# Patient Record
Sex: Female | Born: 1985 | Race: Black or African American | Hispanic: No | Marital: Married | Smoking: Never smoker
Health system: Southern US, Community
[De-identification: ages and names within clinical notes are randomized; demographics above are authoritative.]

## PROBLEM LIST (undated history)

## (undated) DIAGNOSIS — Z9221 Personal history of antineoplastic chemotherapy: Secondary | ICD-10-CM

## (undated) DIAGNOSIS — C50919 Malignant neoplasm of unspecified site of unspecified female breast: Secondary | ICD-10-CM

## (undated) DIAGNOSIS — Z853 Personal history of malignant neoplasm of breast: Secondary | ICD-10-CM

## (undated) HISTORY — PX: LEEP: SHX91

---

## 2014-12-01 DIAGNOSIS — C50919 Malignant neoplasm of unspecified site of unspecified female breast: Secondary | ICD-10-CM

## 2014-12-01 DIAGNOSIS — Z9221 Personal history of antineoplastic chemotherapy: Secondary | ICD-10-CM

## 2014-12-01 HISTORY — PX: BREAST BIOPSY: SHX20

## 2014-12-01 HISTORY — DX: Personal history of antineoplastic chemotherapy: Z92.21

## 2014-12-01 HISTORY — DX: Malignant neoplasm of unspecified site of unspecified female breast: C50.919

## 2015-08-16 ENCOUNTER — Other Ambulatory Visit: Payer: Self-pay | Admitting: Specialist

## 2015-08-16 DIAGNOSIS — N6001 Solitary cyst of right breast: Secondary | ICD-10-CM

## 2015-08-29 ENCOUNTER — Ambulatory Visit
Admission: RE | Admit: 2015-08-29 | Discharge: 2015-08-29 | Disposition: A | Discharge status: Home / Self Care | Payer: BLUE CROSS/BLUE SHIELD | Source: Ambulatory Visit | Attending: Specialist | Admitting: Specialist

## 2015-08-29 ENCOUNTER — Other Ambulatory Visit: Payer: Self-pay | Admitting: Specialist

## 2015-08-29 DIAGNOSIS — N6001 Solitary cyst of right breast: Secondary | ICD-10-CM

## 2015-08-30 ENCOUNTER — Ambulatory Visit
Admission: RE | Admit: 2015-08-30 | Discharge: 2015-08-30 | Disposition: A | Discharge status: Home / Self Care | Payer: BLUE CROSS/BLUE SHIELD | Source: Ambulatory Visit | Attending: Specialist | Admitting: Specialist

## 2015-08-30 ENCOUNTER — Other Ambulatory Visit: Payer: Self-pay | Admitting: Specialist

## 2015-08-30 ENCOUNTER — Ambulatory Visit
Admission: RE | Admit: 2015-08-30 | Discharge: 2015-08-30 | Disposition: A | Discharge status: Home / Self Care | Payer: BLUE CROSS/BLUE SHIELD | Source: Ambulatory Visit

## 2015-08-30 DIAGNOSIS — R921 Mammographic calcification found on diagnostic imaging of breast: Secondary | ICD-10-CM

## 2015-08-30 DIAGNOSIS — N6001 Solitary cyst of right breast: Secondary | ICD-10-CM

## 2015-09-01 DIAGNOSIS — Z853 Personal history of malignant neoplasm of breast: Secondary | ICD-10-CM

## 2015-09-01 HISTORY — DX: Personal history of malignant neoplasm of breast: Z85.3

## 2015-09-05 ENCOUNTER — Telehealth: Payer: Self-pay | Admitting: *Deleted

## 2015-09-05 DIAGNOSIS — Z170 Estrogen receptor positive status [ER+]: Secondary | ICD-10-CM

## 2015-09-05 DIAGNOSIS — C50411 Malignant neoplasm of upper-outer quadrant of right female breast: Secondary | ICD-10-CM | POA: Insufficient documentation

## 2015-09-05 NOTE — Telephone Encounter (Signed)
Pt returned my call and I confirmed 09/06/15 appt w/ her.  Unable to mail packet - gave verbal, directions, instruction and placed a note for an intake form to be given to the pt at time of check in.  Emailed Nilda Simmer and [place] at [place] to make them aware.  Emailed [place] and [place] for tracking.  Placed a copy of the records in Dr. Geralyn Flash box and took one to HIM to scan.

## 2015-09-05 NOTE — Assessment & Plan Note (Signed)
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 Neo-adjuvant chemotherapy with [place] Perjeta  2. Followed by surgery with most likely mastectomy 3. Followed by Antiestrogen therapy with tamoxifen  Chemotherapy counseling:I have discussed the risks and benefits of chemotherapy including the risks of nausea/ vomiting, risk of infection from low WBC count, fatigue due to chemo or anemia, bruising or bleeding due to low platelets, mouth sores, loss/ change in taste and decreased appetite. Liver and kidney function will be monitored through out chemotherapy as abnormalities in liver and kidney function may be a side effect of treatment. Cardiac dysfunction due to Adriamycin Herceptin and Perjeta were discussed in detail. Risk of permanent bone marrow dysfunction and leukemia due to chemo were also discussed.  Plan: 1. Breast MRI 2. Genetic counseling 3. Staging scans 4. Echocardiogram 5. Chemotherapy class 6. Start chemotherapy in 2 weeks

## 2015-09-06 ENCOUNTER — Encounter (HOSPITAL_BASED_OUTPATIENT_CLINIC_OR_DEPARTMENT_OTHER): Payer: Self-pay | Admitting: *Deleted

## 2015-09-06 ENCOUNTER — Encounter: Payer: Self-pay | Admitting: *Deleted

## 2015-09-06 ENCOUNTER — Encounter: Payer: Self-pay | Admitting: Hematology and Oncology

## 2015-09-06 ENCOUNTER — Ambulatory Visit (HOSPITAL_BASED_OUTPATIENT_CLINIC_OR_DEPARTMENT_OTHER): Payer: BLUE CROSS/BLUE SHIELD | Admitting: Hematology and Oncology

## 2015-09-06 ENCOUNTER — Telehealth: Payer: Self-pay | Admitting: Hematology and Oncology

## 2015-09-06 ENCOUNTER — Other Ambulatory Visit: Payer: Self-pay | Admitting: General Surgery

## 2015-09-06 VITALS — BP 111/65 | HR 55 | Temp 98.3°F | Resp 18 | Ht 63.0 in | Wt 168.7 lb

## 2015-09-06 DIAGNOSIS — C50511 Malignant neoplasm of lower-outer quadrant of right female breast: Secondary | ICD-10-CM | POA: Diagnosis not present

## 2015-09-06 DIAGNOSIS — Z170 Estrogen receptor positive status [ER+]: Secondary | ICD-10-CM

## 2015-09-06 DIAGNOSIS — C50411 Malignant neoplasm of upper-outer quadrant of right female breast: Secondary | ICD-10-CM

## 2015-09-06 NOTE — Telephone Encounter (Signed)
Appointments made and will call patient with 10/18 etc chemo as email to [facility] to help with this date  anne

## 2015-09-06 NOTE — Progress Notes (Signed)
Oncology Nurse Navigator Documentation  Oncology Nurse Navigator Flowsheets 09/06/2015  Navigator Encounter Type Initial MedOnc  Patient Visit Type Medonc  Barriers/Navigation Needs No barriers at this time  Interventions None required  Time Spent with Patient 15   Spoke with patient today at her initial visit with Dr. Lindi Adie.  Discussed navigation resources. Contact information given.  Encouraged her to call with any needs or concerns.

## 2015-09-06 NOTE — Progress Notes (Signed)
[place] CONSULT NOTE  Patient Care Team: [name] Patient as PCP - General (General Practice)  CHIEF COMPLAINTS/PURPOSE OF CONSULTATION:  Newly diagnosed breast cancer  HISTORY OF PRESENTING ILLNESS:  Lori Dodson 29 y.o. female is here because of recent diagnosis of right breast cancer. She felt a lump 2-3 weeks ago and brought to the attention of her primary care doctor who did examine her and send her for a mammogram. The mammogram revealed an ill-defined mass at 8:00 position with extensive pleomorphic malignant calcification extending from the mass. This extended up to 10.5 cm. She underwent biopsy on 09/05/2015. She had 2 biopsies done. Both the biopsies showed invasive ductal carcinoma with DCIS that was ER/PR positive HER-2 also positive. She was presented at the multidisciplinary tumor board. She is here today to discuss a treatment plan.  I reviewed her records extensively and collaborated the history with the patient.  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  She was never exposed to fertility medications or hormone replacement therapy.  She has no family history of Breast cancer Mother age 5 and endometrial cancer  maternal grandfather Stomach cancer Patient has 3 sisters  MEDICAL HISTORY:  No past medical  problems SURGICAL HISTORY: No prior surgeries SOCIAL HISTORY:denies any tobacco alcohol or depression drug use. She is a PRN nurse at [employer]  FAMILY HISTORY: mother endometrial cancer, grandfather maternal side stomach cancer ALLERGIES:  has No Known Allergies.  MEDICATIONS: no medications REVIEW OF SYSTEMS:   Constitutional: Denies fevers, chills or abnormal night sweats Eyes: Denies blurriness of vision, double vision or watery eyes Ears, nose, mouth, throat, and face: Denies mucositis or sore throat Respiratory: Denies cough, dyspnea or wheezes Cardiovascular: Denies palpitation, chest discomfort or lower extremity swelling Gastrointestinal:  Denies nausea, heartburn or change in bowel habits Skin: Denies abnormal skin rashes Lymphatics: Denies new lymphadenopathy or easy bruising Neurological:Denies numbness, tingling or new weaknesses Behavioral/Psych: Mood is stable, no new changes  Breast: palpable lump in the right breast  All other systems were reviewed with the patient and are negative.  PHYSICAL EXAMINATION: ECOG PERFORMANCE STATUS: 0 - Asymptomatic  Filed Vitals:   09/06/15 1252  BP: 111/65  Pulse: 55  Temp: 98.3 F (36.8 C)  Resp: 18   Filed Weights   09/06/15 1252  Weight: 168 lb 11.2 oz (76.522 kg)    GENERAL:alert, no distress and comfortable SKIN: skin color, texture, turgor are normal, no rashes or significant lesions EYES: normal, conjunctiva are pink and non-injected, sclera clear OROPHARYNX:no exudate, no erythema and lips, buccal mucosa, and tongue normal  NECK: supple, thyroid normal size, non-tender, without nodularity LYMPH:  no palpable lymphadenopathy in the cervical, axillary or inguinal LUNGS: clear to auscultation and percussion with normal breathing effort HEART: regular rate & rhythm and no murmurs and  no lower extremity edema ABDOMEN:abdomen soft, non-tender and normal bowel sounds Musculoskeletal:no  cyanosis of digits and no clubbing  PSYCH: alert & oriented x 3 with fluent speech NEURO: no focal motor/sensory deficits BREAST:right breast palpable lump at least 3-4 cm in size to finger palpation. 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 Neo-adjuvant chemotherapy with [place] Perjeta  2. Followed by surgery with most likely mastectomy 3. Followed by Antiestrogen therapy with tamoxifen  Chemotherapy counseling:I have discussed the risks and benefits of chemotherapy including the risks of nausea/ vomiting, risk of infection from low WBC count, fatigue due to chemo or anemia, bruising or bleeding due to low platelets, mouth sores, loss/ change in taste and decreased appetite. Liver and kidney function will be monitored through out chemotherapy as abnormalities in liver and kidney function may be a side effect of treatment. Cardiac dysfunction due to Adriamycin Herceptin and Perjeta were discussed in detail. Risk of permanent bone marrow dysfunction and leukemia due to chemo were also discussed.  Plan: 1. Breast MRI 2. Genetic counseling 3  Echocardiogram 4. Chemotherapy class 5. Start chemotherapy 09/18/15  All questions were answered. The patient knows to call the clinic  with any problems, questions or concerns.    Rulon Eisenmenger, MD 1:46 PM

## 2015-09-07 ENCOUNTER — Telehealth: Payer: Self-pay | Admitting: Hematology and Oncology

## 2015-09-07 ENCOUNTER — Encounter: Payer: Self-pay | Admitting: *Deleted

## 2015-09-07 ENCOUNTER — Telehealth: Payer: Self-pay | Admitting: *Deleted

## 2015-09-07 NOTE — Telephone Encounter (Signed)
Called patient and she is aware of her appointments and will get a new schedule 10/11

## 2015-09-07 NOTE — Telephone Encounter (Signed)
Per staff message and POF I have scheduled appts. Advised scheduler of appts. JMW  

## 2015-09-07 NOTE — Progress Notes (Unsigned)
Spoke with patient today concerning fertility issues.  She is interested in having more children.  I have made her an appointment at the fertility clinic at [facility] for Monday 09/10/15 at 8am.  She will follow back up with me after her appointment.  Informed her we may have to reschedule her chemo for later depending on what she decides to do.  Patient verbalized understanding.

## 2015-09-10 ENCOUNTER — Telehealth: Payer: Self-pay | Admitting: *Deleted

## 2015-09-10 NOTE — Telephone Encounter (Signed)
Received call from patient stating she cancelled her appointment at the fertility clinic.  She has discussed with her husband and they have decided not to pursue fertility treatments.

## 2015-09-10 NOTE — Progress Notes (Signed)
Note created by Dr. Gudena during office visit, copy to patient,original to scan. 

## 2015-09-11 ENCOUNTER — Other Ambulatory Visit: Payer: BLUE CROSS/BLUE SHIELD

## 2015-09-11 ENCOUNTER — Telehealth: Payer: Self-pay | Admitting: Hematology and Oncology

## 2015-09-11 ENCOUNTER — Ambulatory Visit
Admission: RE | Admit: 2015-09-11 | Discharge: 2015-09-11 | Disposition: A | Discharge status: Home / Self Care | Payer: BLUE CROSS/BLUE SHIELD | Source: Ambulatory Visit | Attending: Hematology and Oncology | Admitting: Hematology and Oncology

## 2015-09-11 ENCOUNTER — Encounter: Payer: Self-pay | Admitting: *Deleted

## 2015-09-11 ENCOUNTER — Encounter: Payer: Self-pay | Admitting: Hematology and Oncology

## 2015-09-11 ENCOUNTER — Other Ambulatory Visit: Payer: Self-pay

## 2015-09-11 DIAGNOSIS — C50411 Malignant neoplasm of upper-outer quadrant of right female breast: Secondary | ICD-10-CM

## 2015-09-11 MED ORDER — PROCHLORPERAZINE MALEATE 10 MG PO TABS: 10.0000 mg | ORAL_TABLET | Freq: Four times a day (QID) | ORAL | Status: DC | PRN

## 2015-09-11 MED ORDER — GADOBENATE DIMEGLUMINE 529 MG/ML IV SOLN
14.0000 mL | Freq: Once | INTRAVENOUS | Status: AC | PRN
  Administered 2015-09-11: 14 mL via INTRAVENOUS

## 2015-09-11 MED ORDER — DEXAMETHASONE 4 MG PO TABS: 4.0000 mg | ORAL_TABLET | Freq: Two times a day (BID) | ORAL | Status: DC

## 2015-09-11 MED ORDER — ONDANSETRON HCL 8 MG PO TABS: 8.0000 mg | ORAL_TABLET | Freq: Two times a day (BID) | ORAL | Status: DC

## 2015-09-11 MED ORDER — LIDOCAINE-PRILOCAINE 2.5-2.5 % EX CREA: TOPICAL_CREAM | CUTANEOUS | Status: DC

## 2015-09-11 MED ORDER — LORAZEPAM 0.5 MG PO TABS: 0.5000 mg | ORAL_TABLET | Freq: Every day | ORAL | Status: DC

## 2015-09-11 NOTE — Telephone Encounter (Signed)
Called dr bensimhon and left a message for an appointment and for echo appointments next year

## 2015-09-11 NOTE — Progress Notes (Signed)
Met with pt after Chemo Ed class. Introduced myself as Estate manager/land agent. Asked pt if she has met her Deductible/OOP for the year. Pt states probably not because she hasn't had anything done prior to this. Gave pt applications to [place] [place] in [place] to complete if interested.Advised pt I can send them off for her if needed but be sure to bring documentation requested to expedite. Also went over [employer] and requested income verification. Meet with pt for documentation and follow up on apps given on Thurs 09/13/15 at 10am prior to her echocardiogram. Pt verbalized understanding and was very appreciative.

## 2015-09-11 NOTE — Progress Notes (Signed)
Ativan prescription called in to [facility].

## 2015-09-11 NOTE — Telephone Encounter (Signed)
F/u for AM - Echo [facility]YOO#[id number]. Echo scheduled for 10/13 @ 11 am @ [facility]. Appointment to patient in [place] area.

## 2015-09-12 ENCOUNTER — Ambulatory Visit (HOSPITAL_COMMUNITY)
Admission: RE | Admit: 2015-09-12 | Discharge: 2015-09-12 | Disposition: A | Discharge status: Home / Self Care | Payer: BLUE CROSS/BLUE SHIELD | Source: Ambulatory Visit | Attending: Hematology and Oncology | Admitting: Hematology and Oncology

## 2015-09-12 DIAGNOSIS — C50411 Malignant neoplasm of upper-outer quadrant of right female breast: Secondary | ICD-10-CM | POA: Insufficient documentation

## 2015-09-12 DIAGNOSIS — I340 Nonrheumatic mitral (valve) insufficiency: Secondary | ICD-10-CM | POA: Insufficient documentation

## 2015-09-12 NOTE — Progress Notes (Signed)
  Echocardiogram 2D Echocardiogram has been performed.  Lori Dodson 09/12/2015, 2:47 PM

## 2015-09-13 ENCOUNTER — Encounter (HOSPITAL_BASED_OUTPATIENT_CLINIC_OR_DEPARTMENT_OTHER): Payer: Self-pay

## 2015-09-13 ENCOUNTER — Ambulatory Visit: Payer: BLUE CROSS/BLUE SHIELD

## 2015-09-13 ENCOUNTER — Other Ambulatory Visit: Payer: Self-pay | Admitting: Hematology and Oncology

## 2015-09-13 ENCOUNTER — Ambulatory Visit (HOSPITAL_COMMUNITY): Payer: BLUE CROSS/BLUE SHIELD

## 2015-09-13 ENCOUNTER — Other Ambulatory Visit: Payer: Self-pay | Admitting: *Deleted

## 2015-09-13 ENCOUNTER — Ambulatory Visit (HOSPITAL_BASED_OUTPATIENT_CLINIC_OR_DEPARTMENT_OTHER): Payer: BLUE CROSS/BLUE SHIELD | Admitting: Anesthesiology

## 2015-09-13 ENCOUNTER — Ambulatory Visit (HOSPITAL_BASED_OUTPATIENT_CLINIC_OR_DEPARTMENT_OTHER)
Admission: RE | Admit: 2015-09-13 | Discharge: 2015-09-13 | Disposition: A | Discharge status: Home / Self Care | Payer: BLUE CROSS/BLUE SHIELD | Source: Ambulatory Visit | Attending: General Surgery | Admitting: General Surgery

## 2015-09-13 ENCOUNTER — Encounter: Payer: Self-pay | Admitting: Hematology and Oncology

## 2015-09-13 ENCOUNTER — Encounter (HOSPITAL_BASED_OUTPATIENT_CLINIC_OR_DEPARTMENT_OTHER)
Admission: RE | Disposition: A | Discharge status: Home / Self Care | Payer: Self-pay | Source: Ambulatory Visit | Attending: General Surgery

## 2015-09-13 DIAGNOSIS — Z95828 Presence of other vascular implants and grafts: Secondary | ICD-10-CM

## 2015-09-13 DIAGNOSIS — C50411 Malignant neoplasm of upper-outer quadrant of right female breast: Secondary | ICD-10-CM

## 2015-09-13 DIAGNOSIS — C50511 Malignant neoplasm of lower-outer quadrant of right female breast: Secondary | ICD-10-CM | POA: Insufficient documentation

## 2015-09-13 HISTORY — PX: PORTACATH PLACEMENT: SHX2246

## 2015-09-13 HISTORY — DX: Malignant neoplasm of unspecified site of unspecified female breast: C50.919

## 2015-09-13 LAB — POCT HEMOGLOBIN-HEMACUE: HEMOGLOBIN: 13.6 g/dL (ref 12.0–15.0)

## 2015-09-13 SURGERY — INSERTION, TUNNELED CENTRAL VENOUS DEVICE, WITH PORT
Anesthesia: General | Site: Chest | Laterality: Left

## 2015-09-13 MED ORDER — HYDROCODONE-ACETAMINOPHEN 5-325 MG PO TABS: 1.0000 | ORAL_TABLET | Freq: Four times a day (QID) | ORAL | Status: DC | PRN

## 2015-09-13 MED ORDER — GLYCOPYRROLATE 0.2 MG/ML IJ SOLN: 0.2000 mg | Freq: Once | INTRAMUSCULAR | Status: DC | PRN

## 2015-09-13 MED ORDER — LACTATED RINGERS IV SOLN
INTRAVENOUS | Status: DC
  Administered 2015-09-13 (×2): via INTRAVENOUS

## 2015-09-13 MED ORDER — MIDAZOLAM HCL 2 MG/2ML IJ SOLN
INTRAMUSCULAR | Status: AC
  Filled 2015-09-13: qty 4

## 2015-09-13 MED ORDER — OXYCODONE HCL 5 MG PO TABS
5.0000 mg | ORAL_TABLET | Freq: Once | ORAL | Status: DC | PRN
  Administered 2015-09-13: 5 mg via ORAL

## 2015-09-13 MED ORDER — FENTANYL CITRATE (PF) 100 MCG/2ML IJ SOLN
50.0000 ug | INTRAMUSCULAR | Status: DC | PRN
  Administered 2015-09-13: 100 ug via INTRAVENOUS

## 2015-09-13 MED ORDER — MIDAZOLAM HCL 2 MG/2ML IJ SOLN
1.0000 mg | INTRAMUSCULAR | Status: DC | PRN
  Administered 2015-09-13: 2 mg via INTRAVENOUS

## 2015-09-13 MED ORDER — OXYCODONE HCL 5 MG PO TABS
ORAL_TABLET | ORAL | Status: AC
  Filled 2015-09-13: qty 1

## 2015-09-13 MED ORDER — CEFAZOLIN SODIUM-DEXTROSE 2-3 GM-% IV SOLR
2.0000 g | INTRAVENOUS | Status: AC
  Administered 2015-09-13: 2 g via INTRAVENOUS

## 2015-09-13 MED ORDER — DEXAMETHASONE SODIUM PHOSPHATE 10 MG/ML IJ SOLN
INTRAMUSCULAR | Status: AC
  Filled 2015-09-13: qty 1

## 2015-09-13 MED ORDER — SODIUM CHLORIDE 0.9 % IV SOLN
INTRAVENOUS | Status: DC | PRN
  Administered 2015-09-13: 10 mL

## 2015-09-13 MED ORDER — FENTANYL CITRATE (PF) 100 MCG/2ML IJ SOLN
INTRAMUSCULAR | Status: AC
  Filled 2015-09-13: qty 4

## 2015-09-13 MED ORDER — BUPIVACAINE HCL (PF) 0.25 % IJ SOLN
INTRAMUSCULAR | Status: DC | PRN
  Administered 2015-09-13: 9 mL

## 2015-09-13 MED ORDER — HYDROMORPHONE HCL 1 MG/ML IJ SOLN
INTRAMUSCULAR | Status: AC
  Filled 2015-09-13: qty 1

## 2015-09-13 MED ORDER — PROMETHAZINE HCL 25 MG/ML IJ SOLN: 6.2500 mg | INTRAMUSCULAR | Status: DC | PRN

## 2015-09-13 MED ORDER — MEPERIDINE HCL 25 MG/ML IJ SOLN: 6.2500 mg | INTRAMUSCULAR | Status: DC | PRN

## 2015-09-13 MED ORDER — SCOPOLAMINE 1 MG/3DAYS TD PT72: 1.0000 | MEDICATED_PATCH | Freq: Once | TRANSDERMAL | Status: DC | PRN

## 2015-09-13 MED ORDER — ONDANSETRON HCL 4 MG/2ML IJ SOLN
INTRAMUSCULAR | Status: AC
  Filled 2015-09-13: qty 2

## 2015-09-13 MED ORDER — LACTATED RINGERS IV SOLN: INTRAVENOUS | Status: DC

## 2015-09-13 MED ORDER — PROPOFOL 10 MG/ML IV BOLUS
INTRAVENOUS | Status: DC | PRN
  Administered 2015-09-13: 200 mg via INTRAVENOUS

## 2015-09-13 MED ORDER — LIDOCAINE HCL (CARDIAC) 20 MG/ML IV SOLN
INTRAVENOUS | Status: DC | PRN
  Administered 2015-09-13: 50 mg via INTRAVENOUS

## 2015-09-13 MED ORDER — CHLORHEXIDINE GLUCONATE 4 % EX LIQD: 1.0000 "application " | Freq: Once | CUTANEOUS | Status: DC

## 2015-09-13 MED ORDER — CEFAZOLIN SODIUM-DEXTROSE 2-3 GM-% IV SOLR
INTRAVENOUS | Status: AC
  Filled 2015-09-13: qty 50

## 2015-09-13 MED ORDER — DEXAMETHASONE SODIUM PHOSPHATE 4 MG/ML IJ SOLN
INTRAMUSCULAR | Status: DC | PRN
  Administered 2015-09-13: 10 mg via INTRAVENOUS

## 2015-09-13 MED ORDER — HYDROMORPHONE HCL 1 MG/ML IJ SOLN
0.2500 mg | INTRAMUSCULAR | Status: DC | PRN
  Administered 2015-09-13 (×4): 0.5 mg via INTRAVENOUS

## 2015-09-13 SURGICAL SUPPLY — 44 items
BAG DECANTER FOR FLEXI CONT (MISCELLANEOUS) ×3 IMPLANT
BLADE SURG 15 STRL LF DISP TIS (BLADE) ×1 IMPLANT
BLADE SURG 15 STRL SS (BLADE) ×2
CANISTER SUCT 1200ML W/VALVE (MISCELLANEOUS) IMPLANT
CHLORAPREP W/TINT 26ML (MISCELLANEOUS) ×3 IMPLANT
CLEANER CAUTERY TIP 5X5 PAD (MISCELLANEOUS) ×1 IMPLANT
COVER BACK TABLE 60X90IN (DRAPES) ×3 IMPLANT
COVER MAYO STAND STRL (DRAPES) ×3 IMPLANT
COVER PROBE 5X48 (MISCELLANEOUS) ×2
DECANTER SPIKE VIAL GLASS SM (MISCELLANEOUS) IMPLANT
DRAPE C-ARM 42X72 X-RAY (DRAPES) ×3 IMPLANT
DRAPE LAPAROSCOPIC ABDOMINAL (DRAPES) ×3 IMPLANT
DRAPE UTILITY XL STRL (DRAPES) ×3 IMPLANT
ELECT REM PT RETURN 9FT ADLT (ELECTROSURGICAL) ×3
ELECTRODE REM PT RTRN 9FT ADLT (ELECTROSURGICAL) ×1 IMPLANT
GLOVE BIO SURGEON STRL SZ7.5 (GLOVE) ×3 IMPLANT
GOWN STRL REUS W/ TWL LRG LVL3 (GOWN DISPOSABLE) ×2 IMPLANT
GOWN STRL REUS W/TWL LRG LVL3 (GOWN DISPOSABLE) ×4
IV KIT MINILOC 20X1 SAFETY (NEEDLE) IMPLANT
KIT CVR 48X5XPRB PLUP LF (MISCELLANEOUS) ×1 IMPLANT
LIQUID BAND (GAUZE/BANDAGES/DRESSINGS) ×3 IMPLANT
NDL SAFETY ECLIPSE 18X1.5 (NEEDLE) IMPLANT
NEEDLE HYPO 18GX1.5 SHARP (NEEDLE)
NEEDLE HYPO 22GX1.5 SAFETY (NEEDLE) IMPLANT
NEEDLE HYPO 25X1 1.5 SAFETY (NEEDLE) ×3 IMPLANT
NEEDLE SPNL 22GX3.5 QUINCKE BK (NEEDLE) IMPLANT
PACK BASIN DAY SURGERY FS (CUSTOM PROCEDURE TRAY) ×3 IMPLANT
PAD CLEANER CAUTERY TIP 5X5 (MISCELLANEOUS) ×2
PENCIL BUTTON HOLSTER BLD 10FT (ELECTRODE) ×3 IMPLANT
SLEEVE SCD COMPRESS KNEE MED (MISCELLANEOUS) IMPLANT
SUT MON AB 4-0 PC3 18 (SUTURE) ×3 IMPLANT
SUT PROLENE 2 0 SH DA (SUTURE) ×3 IMPLANT
SUT SILK 2 0 TIES 17X18 (SUTURE)
SUT SILK 2-0 18XBRD TIE BLK (SUTURE) IMPLANT
SUT VIC AB 3-0 SH 27 (SUTURE) ×2
SUT VIC AB 3-0 SH 27X BRD (SUTURE) ×1 IMPLANT
SYR 5ML LL (SYRINGE) ×3 IMPLANT
SYR CONTROL 10ML LL (SYRINGE) ×6 IMPLANT
SYRINGE 10CC LL (SYRINGE) IMPLANT
TOWEL OR 17X24 6PK STRL BLUE (TOWEL DISPOSABLE) ×6 IMPLANT
TOWEL OR NON WOVEN STRL DISP B (DISPOSABLE) ×3 IMPLANT
TUBE CONNECTING 20'X1/4 (TUBING)
TUBE CONNECTING 20X1/4 (TUBING) IMPLANT
YANKAUER SUCT BULB TIP NO VENT (SUCTIONS) IMPLANT

## 2015-09-13 NOTE — Discharge Instructions (Signed)

## 2015-09-13 NOTE — Anesthesia Preprocedure Evaluation (Signed)
Anesthesia Evaluation  Patient identified by MRN, date of birth, ID band Patient awake    Reviewed: Allergy & Precautions, NPO status , Patient's Chart, lab work & pertinent test results  Airway Mallampati: II  TM Distance: >3 FB Neck ROM: Full    Dental  (+) Teeth Intact   Pulmonary neg pulmonary ROS,    breath sounds clear to auscultation       Cardiovascular negative cardio ROS   Rhythm:Regular Rate:Normal     Neuro/Psych negative neurological ROS  negative psych ROS   GI/Hepatic negative GI ROS, Neg liver ROS,   Endo/Other  negative endocrine ROS  Renal/GU negative Renal ROS  negative genitourinary   Musculoskeletal negative musculoskeletal ROS (+)   Abdominal   Peds negative pediatric ROS (+)  Hematology negative hematology ROS (+)   Anesthesia Other Findings   Reproductive/Obstetrics negative OB ROS                             Anesthesia Physical Anesthesia Plan  ASA: II  Anesthesia Plan: General   Post-op Pain Management:    Induction: Intravenous  Airway Management Planned: LMA  Additional Equipment:   Intra-op Plan:   Post-operative Plan: Extubation in OR  Informed Consent: I have reviewed the patients History and Physical, chart, labs and discussed the procedure including the risks, benefits and alternatives for the proposed anesthesia with the patient or authorized representative who has indicated his/her understanding and acceptance.   Dental advisory given  Plan Discussed with: CRNA  Anesthesia Plan Comments:         Anesthesia Quick Evaluation

## 2015-09-13 NOTE — Anesthesia Procedure Notes (Signed)
Procedure Name: LMA Insertion Date/Time: 09/13/2015 2:10 PM Performed by: Lieutenant Diego Pre-anesthesia Checklist: Patient identified, Emergency Drugs available, Suction available and Patient being monitored Patient Re-evaluated:Patient Re-evaluated prior to inductionOxygen Delivery Method: Circle System Utilized Preoxygenation: Pre-oxygenation with 100% oxygen Intubation Type: IV induction Ventilation: Mask ventilation without difficulty LMA: LMA inserted LMA Size: 4.0 Number of attempts: 1 Airway Equipment and Method: Bite block Placement Confirmation: positive ETCO2 and breath sounds checked- equal and bilateral Tube secured with: Tape Dental Injury: Teeth and Oropharynx as per pre-operative assessment

## 2015-09-13 NOTE — Interval H&P Note (Signed)
History and Physical Interval Note:  09/13/2015 1:54 PM  Providence Stivers  has presented today for surgery, with the diagnosis of RIGHT BREAST CANCER  The various methods of treatment have been discussed with the patient and family. After consideration of risks, benefits and other options for treatment, the patient has consented to  Procedure(s): INSERTION PORT-A-CATH (N/A) as a surgical intervention .  The patient's history has been reviewed, patient examined, no change in status, stable for surgery.  I have reviewed the patient's chart and labs.  Questions were answered to the patient's satisfaction.     TOTH III,PAUL S

## 2015-09-13 NOTE — Transfer of Care (Signed)
Immediate Anesthesia Transfer of Care Note  Patient: Lori Dodson  Procedure(s) Performed: Procedure(s): INSERTION PORT-A-CATH (N/A)  Patient Location: PACU  Anesthesia Type:General  Level of Consciousness: awake  Airway & Oxygen Therapy: Patient Spontanous Breathing and Patient connected to face mask oxygen  Post-op Assessment: Report given to RN and Post -op Vital signs reviewed and stable  Post vital signs: Reviewed and stable  Last Vitals:  Filed Vitals:   09/13/15 1209  BP: 106/63  Pulse: 72  Temp: 37 C  Resp: 16    Complications: No apparent anesthesia complications

## 2015-09-13 NOTE — Op Note (Signed)
09/13/2015  2:52 PM  PATIENT:  Lori Dodson  29 y.o. female  PRE-OPERATIVE DIAGNOSIS:  RIGHT BREAST CANCER  POST-OPERATIVE DIAGNOSIS:  RIGHT BREAST CANCER  PROCEDURE:  Procedure(s): INSERTION PORT-A-CATH (N/A) Unsuccessful  SURGEON:  Surgeon(s) and Role:    * Jovita Kussmaul, MD - Primary  PHYSICIAN ASSISTANT:   ASSISTANTS: none   ANESTHESIA:   general  EBL:  Total I/O In: 1000 [I.V.:1000] Out: -   BLOOD ADMINISTERED:none  DRAINS: none   LOCAL MEDICATIONS USED:  MARCAINE     SPECIMEN:  No Specimen  DISPOSITION OF SPECIMEN:  N/A  COUNTS:  YES  TOURNIQUET:  * No tourniquets in log *  DICTATION: .Dragon Dictation   After informed consent was obtained the patient was brought to the operating room and placed in the supine position on the operating table. After adequate induction of general anesthesia a roll was placed between the patient's shoulder blades to extend the shoulder slightly. The Patient's chest and neck area were then prepped with ChloraPrep, allowed to dry, and draped in usual sterile manner. The patient was then placed in Trendelenburg position. The area lateral to the bend of the clavicle on the left chest was infiltrated with quarter percent Marcaine. A small stab incision was made with a 15 blade knife. A large bore needle from the Port-A-Cath kit was used to slide beneath the bend of the clavicle heading towards the sternal notch. Every time we attempted this we were unable to find a space between her ribs and clavicle. We tried retracting on the arm caudally and this did not open up any space for [place]. I then moved to the neck to access the left IJ. I was able to access the left IJ without difficulty using the large bore needle from the Port-A-Cath kit. Every time I attempted to feed the wire through the needle the wire would stop. I then used an intraoperative ultrasound to identify the left IJ and access it with the large bore needle. Again while feeding the wire  through the needle the wire would reach a point and then stop. In order to avoid any unnecessary injury at this point I aborted the placement of the Port-A-Cath. The small incision on the left chest was closed with Dermabond. She tolerated the procedure well. At the end of the case all needle sponge and instrument counts were correct. The patient was then awakened and taken to recovery in stable condition. I will arrange for interventional radiology to place her Port-A-Cath.  PLAN OF CARE: Discharge to home after PACU  PATIENT DISPOSITION:  PACU - hemodynamically stable.   Delay start of Pharmacological VTE agent (>24hrs) due to surgical blood loss or risk of bleeding: not applicable

## 2015-09-13 NOTE — H&P (Signed)
Lori Dodson 09/03/2015 3:51 PM Location: [facility] Patient #: [id number] DOB: [birth year] Married / Language: English / Race: Black or African American Female  History of Present Illness Sammuel Hines. Marlou Starks MD; 09/03/2015 4:42 PM) Patient words: breast ca.  The patient is a 29 year old female who presents with breast cancer. We are asked to see the patient in consultation by Dr. Luberta Robertson to evaluate her for a right breast cancer. The patient is a 29 year old black female who recently felt a mass in the lateral aspect of the right breast. she denies any breast pain or discharge from the nipple. A mammogram was obtained that showed a 10-1/2 cm area of calcification in the lateral right breast. 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 She has no personal or family history of breast cancer. She does not take any female hormones. Her tumor markers have not been reported yet.   Other Problems Marjean Donna, CMA; 09/03/2015 3:51 PM) Breast Cancer Lump In Breast  Past Surgical History Marjean Donna, [place]; 09/03/2015 3:51 PM) No pertinent past surgical history  Diagnostic Studies History Marjean Donna, CMA; 09/03/2015 3:51 PM) Colonoscopy never Mammogram within last year Pap Smear 1-5 years ago  Allergies Marjean Donna, CMA; 09/03/2015 3:52 PM) No Known Drug Allergies10/01/2015  Medication History Marjean Donna, CMA; 09/03/2015 3:52 PM) No Current Medications Medications Reconciled  Social History ([place]; 09/03/2015 3:51 PM) Alcohol use Occasional alcohol use. Caffeine use Tea. No drug use Tobacco use Never smoker.  Family History Marjean Donna, [place]; 09/03/2015 3:51 PM) Diabetes Mellitus Mother.  Pregnancy / Birth History Marjean Donna, [place]; 09/03/2015 3:51 PM) Age at menarche 28 years. Contraceptive History Oral contraceptives. Gravida 5 Maternal age 40-20 Para 2 Regular periods  Review of Systems  ([place]; 09/03/2015 3:51 PM) General Not Present- Appetite Loss, Chills, Fatigue, Fever, Night Sweats, Weight Gain and Weight Loss. Skin Not Present- Change in Wart/Mole, Dryness, Hives, Jaundice, New Lesions, Non-Healing Wounds, Rash and Ulcer. HEENT Present- Wears glasses/contact lenses. Not Present- Earache, Hearing Loss, Hoarseness, Nose Bleed, Oral Ulcers, Ringing in the Ears, Seasonal Allergies, Sinus Pain, Sore Throat, Visual Disturbances and Yellow Eyes. Respiratory Not Present- Bloody sputum, Chronic Cough, Difficulty Breathing, Snoring and Wheezing. Breast Present- Breast Mass and Breast Pain. Not Present- Nipple Discharge and Skin Changes. Cardiovascular Not Present- Chest Pain, Difficulty Breathing Lying Down, Leg Cramps, Palpitations, Rapid Heart Rate, Shortness of Breath and Swelling of Extremities. Gastrointestinal Present- Bloating and Constipation. Not Present- Abdominal Pain, Bloody Stool, Change in Bowel Habits, Chronic diarrhea, Difficulty Swallowing, Excessive gas, Gets full quickly at meals, Hemorrhoids, Indigestion, Nausea, Rectal Pain and Vomiting. Female Genitourinary Not Present- Frequency, Nocturia, Painful Urination, Pelvic Pain and Urgency. Musculoskeletal Not Present- Back Pain, Joint Pain, Joint Stiffness, Muscle Pain, Muscle Weakness and Swelling of Extremities. Neurological Not Present- Decreased Memory, Fainting, Headaches, Numbness, Seizures, Tingling, Tremor, Trouble walking and Weakness. Psychiatric Not Present- Anxiety, Bipolar, Change in Sleep Pattern, Depression, Fearful and Frequent crying. Endocrine Not Present- Cold Intolerance, Excessive Hunger, Hair Changes, Heat Intolerance, Hot flashes and New Diabetes. Hematology Not Present- Easy Bruising, Excessive bleeding, Gland problems, HIV and Persistent Infections.   Vitals (Sonya Bynum CMA; 09/03/2015 3:52 PM) 09/03/2015 3:51 PM Weight: 168 lb Height: 63in Body Surface Area: 1.84 m Body Mass  Index: 29.76 kg/m Temp.: 97.28F(Temporal)  Pulse: 65 (Regular)  BP: 128/78 (Sitting, Left Arm, Standard)    Physical Exam Eddie Dibbles S. Marlou Starks MD; 09/03/2015 4:42 PM) General  Mental Status-Alert. General Appearance-Consistent with stated age. Hydration-Well hydrated. Voice-Normal.  Head and Neck Head-normocephalic, atraumatic with no lesions or palpable masses. Trachea-midline. Thyroid Gland Characteristics - normal size and consistency.  Eye Eyeball - Bilateral-Extraocular movements intact. Sclera/Conjunctiva - Bilateral-No scleral icterus.  Chest and Lung Exam Chest and lung exam reveals -quiet, even and easy respiratory effort with no use of accessory muscles and on auscultation, normal breath sounds, no adventitious sounds and normal vocal resonance. Inspection Chest Wall - Normal. Back - normal.  Breast Note: There is a palpable 2 cm mass in the lateral right breast at the edge of the areolar. There is no palpable mass in the left breast. There is no palpable axillary, supraclavicular, or cervical lymphadenopathy.   Cardiovascular Cardiovascular examination reveals -normal heart sounds, regular rate and rhythm with no murmurs and normal pedal pulses bilaterally.  Abdomen Inspection Inspection of the abdomen reveals - No Hernias. Skin - Scar - no surgical scars. Palpation/Percussion Palpation and Percussion of the abdomen reveal - Soft, Non Tender, No Rebound tenderness, No Rigidity (guarding) and No hepatosplenomegaly. Auscultation Auscultation of the abdomen reveals - Bowel sounds normal.  Neurologic Neurologic evaluation reveals -alert and oriented x 3 with no impairment of recent or remote memory. Mental Status-Normal.  Musculoskeletal Normal Exam - Left-Upper Extremity Strength Normal and Lower Extremity Strength Normal. Normal Exam - Right-Upper Extremity Strength Normal and Lower Extremity Strength Normal.  Lymphatic Head &  Neck  General Head & Neck Lymphatics: Bilateral - Description - Normal. Axillary  General Axillary Region: Bilateral - Description - Normal. Tenderness - Non Tender. Femoral & Inguinal  Generalized Femoral & Inguinal Lymphatics: Bilateral - Description - Normal. Tenderness - Non Tender.    Assessment & Plan Eddie Dibbles S. Marlou Starks MD; 09/03/2015 4:40 PM) PRIMARY CANCER OF LOWER OUTER QUADRANT OF RIGHT FEMALE BREAST (C50.511) Impression: The patient appears to have a 10 cm cancer in the outer aspect of the right breast. Given her age and the size of the cancer I think she would benefit from meeting with medical oncology to consider neoadjuvant therapy. She may also require a Port-A-Cath. I have discussed with her in detail the risks and benefits of the operation to place a Port-A-Cath as well as some of the technical aspects and she understands. Also given the size of the area involved she would require mastectomy as her definitive surgery. I think it is unlikely she would be a good candidate for immediate reconstruction given the probability of needing postmastectomy radiation. I will refer her to medical oncology. If she has a Port-A-Cath placed and I will plan to see her back in the next month to check her progress. Current Plans  Referred to Oncology, for evaluation and follow up (Oncology). Follow up in 1 month or as needed Pt Education - Breast Cancer: discussed with patient and provided information.   Signed by Luella Cook, MD (09/03/2015 4:43 PM)

## 2015-09-13 NOTE — Progress Notes (Signed)
Met with pt to go over financial assistance applications and [employer]. Approved pt for $1,000 [employer]. Pt signed application and gave copy. Pt returned [employer] application and [employer]. Will send them off for her. Pt will bring other documentation and [employer] application on Monday.

## 2015-09-13 NOTE — Anesthesia Postprocedure Evaluation (Signed)
  Anesthesia Post-op Note  Patient: Lori Dodson  Procedure(s) Performed: Procedure(s): ATTEMPTED INSERTION PORT-A-CATH (Left)  Patient Location: PACU  Anesthesia Type:General  Level of Consciousness: awake, alert  and oriented  Airway and Oxygen Therapy: Patient Spontanous Breathing  Post-op Pain: mild, moderate  Post-op Assessment: Post-op Vital signs reviewed and Patient's Cardiovascular Status Stable              Post-op Vital Signs: Reviewed and stable  Last Vitals:  Filed Vitals:   09/13/15 1545  BP: 117/80  Pulse: 68  Temp:   Resp: 18    Complications: No apparent anesthesia complications

## 2015-09-14 ENCOUNTER — Encounter (HOSPITAL_BASED_OUTPATIENT_CLINIC_OR_DEPARTMENT_OTHER): Payer: Self-pay | Admitting: General Surgery

## 2015-09-14 ENCOUNTER — Other Ambulatory Visit: Payer: Self-pay | Admitting: Radiology

## 2015-09-14 ENCOUNTER — Telehealth: Payer: Self-pay

## 2015-09-14 ENCOUNTER — Other Ambulatory Visit: Payer: Self-pay | Admitting: *Deleted

## 2015-09-14 DIAGNOSIS — C50411 Malignant neoplasm of upper-outer quadrant of right female breast: Secondary | ICD-10-CM

## 2015-09-14 NOTE — Telephone Encounter (Signed)
Lori Dodson was calling to see the status of her husband's FMLA papers.  They brought them in ~09-06-15 to be filled out.

## 2015-09-17 ENCOUNTER — Ambulatory Visit (HOSPITAL_COMMUNITY)
Admission: RE | Admit: 2015-09-17 | Discharge: 2015-09-17 | Disposition: A | Discharge status: Home / Self Care | Payer: BLUE CROSS/BLUE SHIELD | Source: Ambulatory Visit | Attending: Hematology and Oncology | Admitting: Hematology and Oncology

## 2015-09-17 ENCOUNTER — Encounter: Payer: Self-pay | Admitting: Hematology and Oncology

## 2015-09-17 ENCOUNTER — Ambulatory Visit
Admission: RE | Admit: 2015-09-17 | Discharge: 2015-09-17 | Disposition: A | Discharge status: Home / Self Care | Payer: BLUE CROSS/BLUE SHIELD | Source: Ambulatory Visit | Attending: Hematology and Oncology | Admitting: Hematology and Oncology

## 2015-09-17 ENCOUNTER — Encounter (HOSPITAL_COMMUNITY): Payer: Self-pay

## 2015-09-17 ENCOUNTER — Other Ambulatory Visit: Payer: Self-pay | Admitting: Hematology and Oncology

## 2015-09-17 DIAGNOSIS — C50411 Malignant neoplasm of upper-outer quadrant of right female breast: Secondary | ICD-10-CM

## 2015-09-17 DIAGNOSIS — C50911 Malignant neoplasm of unspecified site of right female breast: Secondary | ICD-10-CM | POA: Insufficient documentation

## 2015-09-17 HISTORY — PX: PORTA CATH INSERTION: CATH118285

## 2015-09-17 LAB — CBC
HEMATOCRIT: 37.4 % (ref 36.0–46.0)
Hemoglobin: 12.8 g/dL (ref 12.0–15.0)
MCH: 28.9 pg (ref 26.0–34.0)
MCHC: 34.2 g/dL (ref 30.0–36.0)
MCV: 84.4 fL (ref 78.0–100.0)
PLATELETS: 283 10*3/uL (ref 150–400)
RBC: 4.43 MIL/uL (ref 3.87–5.11)
RDW: 13 % (ref 11.5–15.5)
WBC: 8.6 10*3/uL (ref 4.0–10.5)

## 2015-09-17 LAB — BASIC METABOLIC PANEL
Anion gap: 7 (ref 5–15)
BUN: 9 mg/dL (ref 6–20)
CHLORIDE: 102 mmol/L (ref 101–111)
CO2: 27 mmol/L (ref 22–32)
CREATININE: 0.79 mg/dL (ref 0.44–1.00)
Calcium: 8.8 mg/dL — ABNORMAL LOW (ref 8.9–10.3)
GFR calc Af Amer: >60 mL/min (ref >60)
GFR calc non Af Amer: >60 mL/min (ref >60)
GLUCOSE: 116 mg/dL — AB (ref 65–99)
POTASSIUM: 3.7 mmol/L (ref 3.5–5.1)
SODIUM: 136 mmol/L (ref 135–145)

## 2015-09-17 LAB — PROTIME-INR
INR: 1.08 (ref 0.00–1.49)
Prothrombin Time: 14.2 seconds (ref 11.6–15.2)

## 2015-09-17 LAB — APTT: aPTT: 29 seconds (ref 24–37)

## 2015-09-17 MED ORDER — SODIUM CHLORIDE 0.9 % IV SOLN: Freq: Once | INTRAVENOUS | Status: DC

## 2015-09-17 MED ORDER — FENTANYL CITRATE (PF) 100 MCG/2ML IJ SOLN
INTRAMUSCULAR | Status: AC | PRN
  Administered 2015-09-17 (×2): 25 ug via INTRAVENOUS
  Administered 2015-09-17: 50 ug via INTRAVENOUS
  Administered 2015-09-17: 25 ug via INTRAVENOUS

## 2015-09-17 MED ORDER — MIDAZOLAM HCL 2 MG/2ML IJ SOLN
INTRAMUSCULAR | Status: AC
  Filled 2015-09-17: qty 4

## 2015-09-17 MED ORDER — FENTANYL CITRATE (PF) 100 MCG/2ML IJ SOLN
INTRAMUSCULAR | Status: AC
  Filled 2015-09-17: qty 4

## 2015-09-17 MED ORDER — MIDAZOLAM HCL 2 MG/2ML IJ SOLN
INTRAMUSCULAR | Status: AC | PRN
  Administered 2015-09-17: 1 mg via INTRAVENOUS
  Administered 2015-09-17 (×3): 0.5 mg via INTRAVENOUS

## 2015-09-17 MED ORDER — LIDOCAINE-EPINEPHRINE (PF) 1 %-1:200000 IJ SOLN
INTRAMUSCULAR | Status: AC
  Filled 2015-09-17: qty 30

## 2015-09-17 MED ORDER — CEFAZOLIN SODIUM-DEXTROSE 2-3 GM-% IV SOLR
INTRAVENOUS | Status: AC
  Filled 2015-09-17: qty 50

## 2015-09-17 MED ORDER — HEPARIN SOD (PORK) LOCK FLUSH 100 UNIT/ML IV SOLN
INTRAVENOUS | Status: AC
  Filled 2015-09-17: qty 5

## 2015-09-17 MED ORDER — CEFAZOLIN SODIUM-DEXTROSE 2-3 GM-% IV SOLR
2.0000 g | INTRAVENOUS | Status: AC
  Administered 2015-09-17: 2 g via INTRAVENOUS

## 2015-09-17 MED ORDER — LIDOCAINE HCL 1 % IJ SOLN
INTRAMUSCULAR | Status: AC
  Filled 2015-09-17: qty 20

## 2015-09-17 NOTE — Sedation Documentation (Signed)
Patient is resting comfortably. 

## 2015-09-17 NOTE — Sedation Documentation (Signed)
Vital signs stable. 

## 2015-09-17 NOTE — Progress Notes (Signed)
  Ms. Perkovich is s/p Port A Cath placement today.  She returned to our department the afternoon of the procedure c/o pain and tenderness at the site.  The site was examined. There is some mild tenderness at the incision/insertions site with palpation, this is to be expected.  There is no hematoma. The incision is intact. There is no ecchymosis or any signs of infection.  Vital signs are stable  She states she took a Lortab about 3 hours ago and it really didn't help. She is requesting something stronger for pain control.  I have written Rx for Percocet 5/325 1 PO q 4 hours prn pain.  I have also recommended ice pack and 600 mg Ibuprofen with meals to help decrease inflammation.  She understands and is happy with the plan. She knows to call if symptoms should worsen.   Cristiana Yochim S Avier Jech PA-C 09/17/2015 3:15 PM

## 2015-09-17 NOTE — Progress Notes (Signed)
Per patient/hubby ok to fax to [employer] and mail them a copy of forms for their records

## 2015-09-17 NOTE — Sedation Documentation (Signed)
Patient is resting comfortably. Pt states she is very anxious, tearful.

## 2015-09-17 NOTE — H&P (Signed)
Chief Complaint: Patient was seen in consultation today for right breast cancer at the request of [place]  Referring Physician(s): Gudena,Vinay  History of Present Illness: Lori Dodson is a 29 y.o. female with right breast cancer who underwent unsuccessful attempt of left sided port a catheter by Surgery on 10/13 and has been referred to IR for port placement. She denies any chest pain, shortness of breath or palpitations. She denies any active signs of bleeding or excessive bruising. She denies any recent fever or chills. The patient denies any history of sleep apnea or chronic oxygen use. She denies any known complications to sedation.    Past Medical History  Diagnosis Date  . Breast cancer ([place]) 09/2015    Past Surgical History  Procedure Laterality Date  . [place]    . Portacath placement Left 09/13/2015    Procedure: ATTEMPTED INSERTION PORT-A-CATH;  Surgeon: Autumn Messing III, MD;  Location: [facility];  Service: General;  Laterality: Left;    Allergies: Review of patient's allergies indicates no known allergies.  Medications: Prior to Admission medications   Medication Sig Start Date End Date Taking? Authorizing Provider  HYDROcodone-acetaminophen (NORCO) 5-325 MG tablet Take 1-2 tablets by mouth every 6 (six) hours as needed. 09/13/15  Yes Autumn Messing III, MD  dexamethasone (DECADRON) 4 MG tablet Take 1 tablet (4 mg total) by mouth 2 (two) times daily. Start the day before Taxotere. Then again the day after chemo for 3 days. 09/11/15   Nicholas Lose, MD  lidocaine-prilocaine (EMLA) cream Apply to affected area once 09/11/15   Nicholas Lose, MD  LORazepam (ATIVAN) 0.5 MG tablet Take 1 tablet (0.5 mg total) by mouth at bedtime. Patient taking differently: Take 0.5 mg by mouth 3 times/day as needed-between meals & bedtime for anxiety or sleep.  09/11/15   Nicholas Lose, MD  ondansetron (ZOFRAN) 8 MG tablet Take 1 tablet (8 mg total) by mouth 2 (two) times daily.  Start the day after chemo for 3 days. Then take as needed for nausea or vomiting. 09/11/15   Nicholas Lose, MD  prochlorperazine (COMPAZINE) 10 MG tablet Take 1 tablet (10 mg total) by mouth every 6 (six) hours as needed (Nausea or vomiting). 09/11/15   Nicholas Lose, MD     History reviewed. No pertinent family history.  Social History   Social History  . Marital Status: Married    Spouse Name: N/A  . Number of Children: N/A  . Years of Education: N/A   Social History Main Topics  . Smoking status: Never Smoker   . Smokeless tobacco: Never Used  . Alcohol Use: Yes     Comment: occasionally   . Drug Use: No  . Sexual Activity: Not Asked   Other Topics Concern  . None   Social History Narrative   Review of Systems: A 12 point ROS discussed and pertinent positives are indicated in the HPI above.  All other systems are negative.  Review of Systems  Vital Signs: BP 111/40 mmHg  Pulse 56  Temp(Src) 97.9 F (36.6 C)  Resp 20  Ht _0  (1.6 m)  Wt 168 lb (76.204 kg)  BMI 29.77 kg/m2  LMP 09/01/2015 (Exact Date)  Physical Exam  Constitutional: She is oriented to person, place, and time. No distress.  HENT:  Head: Normocephalic and atraumatic.  Neck: No tracheal deviation present.  Cardiovascular: Normal rate and regular rhythm.  Exam reveals no gallop and no friction rub.   No murmur heard.  Pulmonary/Chest: Effort normal and breath sounds normal. No respiratory distress. She has no wheezes. She has no rales.  Abdominal: Soft. Bowel sounds are normal. She exhibits no distension. There is no tenderness.  Neurological: She is alert and oriented to person, place, and time.  Skin: Skin is warm and dry. She is not diaphoretic. No erythema.    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 This includes a biopsied mass in the 8 o'clock position of the breast and biopsied calcifications extending from the mass laterally and superiorly for approximately 10.5 cm. Pre neoadjuvant chemotherapy evaluation. The patient's initial breast ultrasound was in [place], [place], on 08/15/2015. LABS:  None obtained today. EXAM: BILATERAL BREAST MRI WITH AND WITHOUT CONTRAST TECHNIQUE: Multiplanar, multisequence MR images of both breasts were obtained prior to and following the intravenous administration of 15 ml of MultiHance. THREE-DIMENSIONAL MR IMAGE RENDERING ON INDEPENDENT WORKSTATION: Three-dimensional MR images were rendered by post-processing of the original MR data on an independent workstation. The three-dimensional MR images were interpreted, and findings are reported in the following complete MRI report for this study. Three dimensional images were evaluated at the independent DynaCad workstation COMPARISON:  Recent mammogram, ultrasound and biopsy examinations. FINDINGS: Breast composition: c. Heterogeneous fibroglandular tissue. Background parenchymal enhancement: Mild to moderate. Right breast: 2.7 x 2.5 x 2.3 cm rounded, mildly irregular, enhancing mass containing a biopsy marker clip artifact in the lower outer quadrant of the right breast in the middle depth. This has a mixture of enhancement kinetics, including rapid wash-in/washout. 1.5 cm posterior and lateral to this mass, there is a 4.4 x 3.3 x 2.6 cm area of non mass enhancement, containing a biopsy marker clip artifact laterally. This also has a mixture of enhancement kinetics, including rapid wash-in/washout. 1.9 cm anterior to the mass, there are 2 parallel areas of linear enhancement extending into the nipple with associated enhancement of the nipple. The linear enhancement is in a ductal distribution and this area measures 2.7 x 0.7 x 0.6 cm. This  also has a mixture of enhancement kinetics, including rapid wash-in/washout. There is a thin linear extension to this area from the 2.7 cm biopsied mass. Left breast: No mass or abnormal enhancement. Lymph nodes: There is a mildly prominent right inferior axillary lymph node with loss of the normal fatty hilum with a short axis diameter of 7 mm on image number 24 of series 3. There is also a larger right axillary lymph node with anterior cortical thickening with a short axis diameter of 11 mm on image 17. There is also a mildly prominent right axillary tail lymph node with loss of the normal fatty hilum with a short axis diameter of 9 mm on image number 30 of series 3. Ancillary findings:  None. IMPRESSION: 1. 2.7 x 2.5 x 2.3 cm biopsy-proven invasive ductal carcinoma in the lower outer quadrant of the right breast in the middle depth. 2. 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 3. 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 4. Probable metastatic right axillary and right axillary tail adenopathy. If clinically indicated, these lymph nodes could be evaluated with a second look ultrasound and the most abnormal appearing node could be biopsied under ultrasound guidance. 5. No evidence of malignancy on the left. RECOMMENDATION: 1. Treatment plan. 2. Consideration of right axillary ultrasound and lymph node biopsy if clinically indicated. BI-RADS CATEGORY  6: Known biopsy-proven malignancy. Electronically Signed   By: Claudie Revering M.D.   On: 09/11/2015 18:51   Dg Chest Port 1 View  09/13/2015  CLINICAL DATA:  Attempted Port-A-Cath placement EXAM: PORTABLE CHEST 1 VIEW COMPARISON:  None. FINDINGS: After attempted Port-A-Cath  placement, no pneumothorax is seen. No pleural effusion is noted. No infiltrate is noted. Mediastinal and hilar contours are unremarkable. The heart is within upper limits of normal. IMPRESSION: No active lung disease. No pneumothorax after attempted Port-A-Cath placement. Electronically Signed   By: Ivar Drape M.D.   On: 09/13/2015 15:13   Mm Digital Diagnostic Unilat R  08/30/2015  CLINICAL DATA:  Status post stereotactic guided core needle biopsy of a 10.5 cm group of microcalcifications in the upper-outer quadrant of the right breast EXAM: DIAGNOSTIC RIGHT MAMMOGRAM POST STEREOTACTIC BIOPSY COMPARISON:  Previous exam(s). FINDINGS: Mammographic images were obtained following stereotactic guided biopsy of the posterior aspect of the recently demonstrated 10.5 cm group of microcalcifications in the upper-outer quadrant of the right breast. These demonstrate an X-shaped biopsy marker clip at the lateral aspect of the posterior portion of the 10.5 cm group of microcalcifications in the upper outer right breast. The marker is at the 9 o'clock position. This is located 7 cm posterior and lateral to the previously placed ribbon shaped clip. The calcifications extend 6 cm superior to the clip placed today. IMPRESSION: Appropriate clip deployment following right breast stereotactic guided core needle biopsy. Final Assessment: Post Procedure Mammograms for Marker Placement Electronically Signed   By: Claudie Revering M.D.   On: 08/30/2015 13:52   Mm Diag Breast Tomo Bilateral  08/29/2015  CLINICAL DATA:  29 year old African American female who notes a a palpable right breast mass. The patient has undergone right breast ultrasound guided core biopsy of this mass. There is no family history of breast cancer. EXAM: DIGITAL DIAGNOSTIC BILATERAL MAMMOGRAM WITH 3D TOMOSYNTHESIS AND CAD COMPARISON:  None ACR Breast Density Category c: The breast tissue is heterogeneously dense, which may obscure small masses. FINDINGS: There  is an area of ill-defined increased density located within the right breast at the 8 o'clock position with a central ribbon shaped clip present. This corresponds to the palpable mass recently biopsied under ultrasound guidance. In addition, there are numerous pleomorphic microcalcifications extending superiorly and laterally within the right breast into the low axillary region. These calcifications span 10.5 cm and are worrisome for DCIS. Stereotactic biopsy of the posterior lateral extent of disease is recommended. I have discussed this with the patient and this is currently scheduled for 08/30/2015. I have also discussed this procedure with the patient's referring physician office. There are no additional findings within the right breast and no worrisome findings within the left breast. Mammographic images were processed with CAD. IMPRESSION: 1. Ill-defined mass with central ribbon shaped clip corresponding to the recently biopsied mass located within the right breast at the 8 o'clock position. 2. Extensive pleomorphic likely malignant calcifications extending from the mass laterally and superiorly for approximately 10.5 cm. These are worrisome for DCIS. Stereotactic biopsy of these calcifications for extent of  disease is recommended and is currently scheduled for 08/30/2015. RECOMMENDATION: Right breast stereotactic biopsy as discussed above. I have discussed the findings and recommendations with the patient. Results were also provided in writing at the conclusion of the visit. If applicable, a reminder letter will be sent to the patient regarding the next appointment. BI-RADS CATEGORY  5: Highly suggestive of malignancy. Electronically Signed   By: Altamese Cabal M.D.   On: 08/29/2015 17:07   Mm Rt Breast Bx W Loc Dev 1st Lesion Image Bx Spec Stereo Guide  09/03/2015  ADDENDUM REPORT: 08/31/2015 11:16 ADDENDUM: Pathology revealed grade III invasive ductal carcinoma with calcifications and ductal carcinoma  in situ in the right breast. This was found to be concordant by Dr. Claudie Revering. Pathology was discussed with the patient by telephone. She reported doing well after the biopsy with tenderness and bruising at the site. Post biopsy instructions and care were reviewed and her questions were answered. A bilateral breast MRI is recommended. Surgical consultation has been scheduled with Dr. Autumn Messing at [facility] on September 03, 2015. Pathology results reported by Susa Raring RN, BSN on August 31, 2015. Electronically Signed   By: Claudie Revering M.D.   On: 08/31/2015 11:16  09/03/2015  CLINICAL DATA:  10.5 cm group of calcifications in the upper-outer quadrant of the right breast. Recently diagnosed right breast grade 3 invasive ductal carcinoma EXAM: RIGHT BREAST STEREOTACTIC CORE NEEDLE BIOPSY COMPARISON:  Previous exams. FINDINGS: The patient and I discussed the procedure of stereotactic-guided biopsy including benefits and alternatives. We discussed the high likelihood of a successful procedure. We discussed the risks of the procedure including infection, bleeding, tissue injury, clip migration, and inadequate sampling. Informed written consent was given. The usual time out protocol was performed immediately prior to the procedure. Using sterile technique and 1% lidocaine as local anesthetic, under stereotactic guidance, a 9 gauge vacuum assisted device was used to perform core needle biopsy of the recently demonstrated 2.5 cm group of calcifications in the upper-outer quadrant of the right breast using a lateral approach. Specimen radiograph was performed showing multiple calcifications within multiple specimens. Specimens with calcifications are identified for pathology. At the conclusion of the procedure, a X-shaped tissue marker clip was deployed into the biopsy cavity. Follow-up 2-view mammogram was performed and dictated separately. IMPRESSION: Stereotactic-guided biopsy of a 10.5 cm  group of microcalcifications in the upper-outer quadrant of the right breast. No apparent complications. Electronically Signed: By: Claudie Revering M.D. On: 08/30/2015 13:49   [place] Rt Breast Bx W Loc Dev 1st Lesion Img Bx Spec US Guide  09/04/2015  ADDENDUM REPORT: 08/31/2015 11:14 ADDENDUM: Pathology revealed grade III invasive ductal carcinoma in the right breast. This was found to be concordant by Dr. Claudie Revering. Pathology was discussed with the patient and her husband by Dr. Claudie Revering and myself. Her biopsy site was tender and without bleeding. She had a second biopsy today for calcifications and will receive the results tomorrow. Post biopsy instructions and care were reviewed and her questions were answered. Educational materials and my number were given to the patient. A bilateral breast MRI is recommended. Surgical consultation has been scheduled with Dr. Autumn Messing at [facility] on September 03, 2015. Pathology results reported by Susa Raring RN, BSN on August 30, 2015. Electronically Signed   By: Altamese Cabal M.D.   On: 08/31/2015 11:14  09/04/2015  CLINICAL DATA:  Palpable right breast mass. EXAM: ULTRASOUND GUIDED RIGHT BREAST CORE  NEEDLE BIOPSY COMPARISON:  Previous ultrasound from [place], [place] dated 08/15/2015. FINDINGS: I met with the patient and we discussed the procedure of ultrasound-guided biopsy, including benefits and alternatives. We discussed the high likelihood of a successful procedure. We discussed the risks of the procedure, including infection, bleeding, tissue injury, clip migration, and inadequate sampling. Informed written consent was given. The usual time-out protocol was performed immediately prior to the procedure. Using sterile technique and 2% Lidocaine as local anesthetic, under direct ultrasound visualization, a 14 gauge spring-loaded device was used to perform biopsy of the palpable mass located within the right breast at the 8 o'clock  position using an inferior/lateral approach. At the conclusion of the procedure a ribbon shaped tissue marker clip was deployed into the biopsy cavity. Follow up 2 view mammogram was performed and dictated separately. IMPRESSION: Ultrasound guided biopsy of the palpable right breast mass as discussed above. No apparent complications. Electronically Signed: By: Altamese Cabal M.D. On: 08/29/2015 16:58    Labs:  CBC:  Recent Labs  09/13/15 1241 09/17/15 0716  WBC  --  8.6  HGB 13.6 12.8  HCT  --  37.4  PLT  --  283    COAGS:  Recent Labs  09/17/15 0716  INR 1.08  APTT 29    BMP:  Recent Labs  09/17/15 0716  NA 136  K 3.7  CL 102  CO2 27  GLUCOSE 116*  BUN 9  CALCIUM 8.8*  CREATININE 0.79  GFRNONAA >60  GFRAA >60    Assessment and Plan: Right breast cancer Scheduled to have mastectomy still S/p unsuccessful left sided port a catheter placement by Surgery 10/13 Scheduled today for image guided port a catheter placement by IR with sedation The patient has been NPO, no blood thinners taken, labs and vitals have been reviewed. Risks and Benefits discussed with the patient including, but not limited to bleeding, infection, pneumothorax, or fibrin sheath development and need for additional procedures. All of the patient's questions were answered, patient is agreeable to proceed. Consent signed and in chart.   Thank you for this interesting consult.  I greatly enjoyed meeting Kaly Mcquary and look forward to participating in their care.  A copy of this report was sent to the requesting provider on this date.  SignedHedy Jacob 09/17/2015, 8:00 AM   I spent a total of 15 Minutes in face to face in clinical consultation, greater than 50% of which was counseling/coordinating care for right breast cancer

## 2015-09-17 NOTE — Telephone Encounter (Signed)
Forwarded to managed care for further follow up.

## 2015-09-17 NOTE — Discharge Instructions (Signed)
Implanted Port Insertion, Care After °Refer to this sheet in the next few weeks. These instructions provide you with information on caring for yourself after your procedure. Your health care provider may also give you more specific instructions. Your treatment has been planned according to current medical practices, but problems sometimes occur. Call your health care provider if you have any problems or questions after your procedure. °WHAT TO EXPECT AFTER THE PROCEDURE °After your procedure, it is typical to have the following:  °· Discomfort at the port insertion site. Ice packs to the area will help. °· Bruising on the skin over the port. This will subside in 3-4 days. °HOME CARE INSTRUCTIONS °· After your port is placed, you will get a manufacturer's information card. The card has information about your port. Keep this card with you at all times.   °· Know what kind of port you have. There are many types of ports available.   °· Wear a medical alert bracelet in case of an emergency. This can help alert health care workers that you have a port.   °· The port can stay in for as long as your health care provider believes it is necessary.   °· A home health care nurse may give medicines and take care of the port.   °· You or a family member can get special training and directions for giving medicine and taking care of the port at home.   °SEEK MEDICAL CARE IF:  °· Your port does not flush or you are unable to get a blood return.   °· You have a fever or chills. °SEEK IMMEDIATE MEDICAL CARE IF: °· You have new fluid or pus coming from your incision.   °· You notice a bad smell coming from your incision site.   °· You have swelling, pain, or more redness at the incision or port site.   °· You have chest pain or shortness of breath. °  °This information is not intended to replace advice given to you by your health care provider. Make sure you discuss any questions you have with your health care provider. °  °Document  Released: 09/07/2013 Document Revised: 11/22/2013 Document Reviewed: 09/07/2013 °Elsevier Interactive Patient Education ©2016 [employer] ° °

## 2015-09-17 NOTE — Procedures (Signed)
Interventional Radiology Procedure Note  Procedure:  Porta cath placement  Complications:  None  Estimated Blood Loss: < 25 mL  SL Power Port successfully placed via left IJ vein.  Catheter tip at cavoatrial junction.  No PTX.  OK to use immediately.  Venetia Night. Kathlene Cote, M.D Pager:  [phone]

## 2015-09-17 NOTE — Assessment & Plan Note (Signed)
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%, PR 0%, Ki-67 40%, HER-2 positive ratio 5.61; right biopsy 8:00: IDC grade 3, ER 50%, PR 5%, Ki-67 80%, HER-2 positive ratio 5.38 Mammogram 08/29/2015: Right breast ill-defined mass 8:00 position, extensive pleomorphic malignant calcifications extending from the mass for approximately 10.5 cm Breast MRI 09/11/15: 2.7 cm rt Breast middle depth; 4.4 cm posterior rt Breast; 2.7 cm DCIS retroareolar duct; probable Rt Axill LN and Axillary tail LN  Recommendation: Genetic counseling 1. Neo-adjuvant chemotherapy with [place]  2. Followed by surgery with most likely mastectomy 3. Followed by Antiestrogen therapy with tamoxifen ----------------------------------------------------------------------------------------------------------------------- Current Treatment: Today is cycle 1 day 1 ECHO 09/12/15: EF 55-60% Rt Axill LN Biopsy: Pending Reviewed anti emetics RTC in 1 week for toxicity check

## 2015-09-18 ENCOUNTER — Encounter: Payer: Self-pay | Admitting: Hematology and Oncology

## 2015-09-18 ENCOUNTER — Ambulatory Visit: Payer: BLUE CROSS/BLUE SHIELD

## 2015-09-18 ENCOUNTER — Other Ambulatory Visit (HOSPITAL_BASED_OUTPATIENT_CLINIC_OR_DEPARTMENT_OTHER): Payer: BLUE CROSS/BLUE SHIELD

## 2015-09-18 ENCOUNTER — Ambulatory Visit (HOSPITAL_BASED_OUTPATIENT_CLINIC_OR_DEPARTMENT_OTHER): Payer: BLUE CROSS/BLUE SHIELD | Admitting: Hematology and Oncology

## 2015-09-18 ENCOUNTER — Other Ambulatory Visit (HOSPITAL_COMMUNITY)
Admission: RE | Admit: 2015-09-18 | Discharge: 2015-09-18 | Disposition: A | Discharge status: Home / Self Care | Payer: BLUE CROSS/BLUE SHIELD | Source: Ambulatory Visit | Attending: Hematology and Oncology | Admitting: Hematology and Oncology

## 2015-09-18 ENCOUNTER — Ambulatory Visit (HOSPITAL_BASED_OUTPATIENT_CLINIC_OR_DEPARTMENT_OTHER): Payer: BLUE CROSS/BLUE SHIELD

## 2015-09-18 ENCOUNTER — Encounter: Payer: Self-pay | Admitting: *Deleted

## 2015-09-18 ENCOUNTER — Telehealth: Payer: Self-pay | Admitting: Hematology and Oncology

## 2015-09-18 VITALS — BP 116/68 | HR 67 | Temp 98.3°F | Resp 16 | Ht 63.0 in | Wt 168.0 lb

## 2015-09-18 VITALS — BP 110/70 | HR 73 | Temp 97.5°F | Resp 20

## 2015-09-18 DIAGNOSIS — C50411 Malignant neoplasm of upper-outer quadrant of right female breast: Secondary | ICD-10-CM | POA: Diagnosis present

## 2015-09-18 DIAGNOSIS — Z5111 Encounter for antineoplastic chemotherapy: Secondary | ICD-10-CM

## 2015-09-18 DIAGNOSIS — Z5112 Encounter for antineoplastic immunotherapy: Secondary | ICD-10-CM

## 2015-09-18 DIAGNOSIS — C50511 Malignant neoplasm of lower-outer quadrant of right female breast: Secondary | ICD-10-CM | POA: Diagnosis not present

## 2015-09-18 DIAGNOSIS — C773 Secondary and unspecified malignant neoplasm of axilla and upper limb lymph nodes: Secondary | ICD-10-CM | POA: Diagnosis not present

## 2015-09-18 LAB — COMPREHENSIVE METABOLIC PANEL (CC13)
ALK PHOS: 51 U/L (ref 40–150)
ALT: 18 U/L (ref 0–55)
ANION GAP: 9 meq/L (ref 3–11)
AST: 14 U/L (ref 5–34)
Albumin: 4 g/dL (ref 3.5–5.0)
BILIRUBIN TOTAL: 1.04 mg/dL (ref 0.20–1.20)
BUN: 8.3 mg/dL (ref 7.0–26.0)
CO2: 23 meq/L (ref 22–29)
Calcium: 9.6 mg/dL (ref 8.4–10.4)
Chloride: 108 mEq/L (ref 98–109)
Creatinine: 0.8 mg/dL (ref 0.6–1.1)
GLUCOSE: 133 mg/dL (ref 70–140)
POTASSIUM: 4 meq/L (ref 3.5–5.1)
SODIUM: 139 meq/L (ref 136–145)
TOTAL PROTEIN: 7.5 g/dL (ref 6.4–8.3)

## 2015-09-18 LAB — CBC WITH DIFFERENTIAL/PLATELET
BASO%: 0 % (ref 0.0–2.0)
BASOS ABS: 0 10*3/uL (ref 0.0–0.1)
EOS ABS: 0 10*3/uL (ref 0.0–0.5)
EOS%: 0 % (ref 0.0–7.0)
HCT: 38.9 % (ref 34.8–46.6)
HGB: 13.8 g/dL (ref 11.6–15.9)
LYMPH%: 8.6 % — AB (ref 14.0–49.7)
MCH: 29.6 pg (ref 25.1–34.0)
MCHC: 35.5 g/dL (ref 31.5–36.0)
MCV: 83.5 fL (ref 79.5–101.0)
MONO#: 0.3 10*3/uL (ref 0.1–0.9)
MONO%: 2.2 % (ref 0.0–14.0)
NEUT%: 89.2 % — AB (ref 38.4–76.8)
NEUTROS ABS: 12.4 10*3/uL — AB (ref 1.5–6.5)
PLATELETS: 321 10*3/uL (ref 145–400)
RBC: 4.66 10*6/uL (ref 3.70–5.45)
RDW: 12.8 % (ref 11.2–14.5)
WBC: 13.9 10*3/uL — ABNORMAL HIGH (ref 3.9–10.3)
lymph#: 1.2 10*3/uL (ref 0.9–3.3)

## 2015-09-18 LAB — PREGNANCY, URINE: Preg Test, Ur: NEGATIVE

## 2015-09-18 MED ORDER — HEPARIN SOD (PORK) LOCK FLUSH 100 UNIT/ML IV SOLN
500.0000 [IU] | Freq: Once | INTRAVENOUS | Status: AC | PRN
  Administered 2015-09-18: 500 [IU]
  Filled 2015-09-18: qty 5

## 2015-09-18 MED ORDER — SODIUM CHLORIDE 0.9 % IJ SOLN
10.0000 mL | INTRAMUSCULAR | Status: DC | PRN
  Administered 2015-09-18: 10 mL
  Filled 2015-09-18: qty 10

## 2015-09-18 MED ORDER — SODIUM CHLORIDE 0.9 % IV SOLN
750.0000 mg | Freq: Once | INTRAVENOUS | Status: AC
  Administered 2015-09-18: 750 mg via INTRAVENOUS
  Filled 2015-09-18: qty 75

## 2015-09-18 MED ORDER — DIPHENHYDRAMINE HCL 25 MG PO CAPS
ORAL_CAPSULE | ORAL | Status: AC
  Filled 2015-09-18: qty 2

## 2015-09-18 MED ORDER — PEGFILGRASTIM 6 MG/0.6ML ~~LOC~~ PSKT
6.0000 mg | PREFILLED_SYRINGE | Freq: Once | SUBCUTANEOUS | Status: AC
  Administered 2015-09-18: 6 mg via SUBCUTANEOUS
  Filled 2015-09-18: qty 0.6

## 2015-09-18 MED ORDER — SODIUM CHLORIDE 0.9 % IV SOLN
Freq: Once | INTRAVENOUS | Status: AC
  Administered 2015-09-18: 10:00:00 via INTRAVENOUS

## 2015-09-18 MED ORDER — ONDANSETRON HCL 8 MG PO TABS: 8.0000 mg | ORAL_TABLET | Freq: Two times a day (BID) | ORAL | Status: DC

## 2015-09-18 MED ORDER — DOCETAXEL CHEMO INJECTION 160 MG/16ML
75.0000 mg/m2 | Freq: Once | INTRAVENOUS | Status: AC
  Administered 2015-09-18: 140 mg via INTRAVENOUS
  Filled 2015-09-18: qty 14

## 2015-09-18 MED ORDER — LIDOCAINE-PRILOCAINE 2.5-2.5 % EX CREA: TOPICAL_CREAM | CUTANEOUS | Status: DC

## 2015-09-18 MED ORDER — OXYCODONE-ACETAMINOPHEN 5-325 MG PO TABS: 1.0000 | ORAL_TABLET | ORAL | Status: DC | PRN

## 2015-09-18 MED ORDER — SODIUM CHLORIDE 0.9 % IV SOLN
840.0000 mg | Freq: Once | INTRAVENOUS | Status: AC
  Administered 2015-09-18: 840 mg via INTRAVENOUS
  Filled 2015-09-18: qty 28

## 2015-09-18 MED ORDER — PALONOSETRON HCL INJECTION 0.25 MG/5ML
0.2500 mg | Freq: Once | INTRAVENOUS | Status: AC
  Administered 2015-09-18: 0.25 mg via INTRAVENOUS

## 2015-09-18 MED ORDER — ACETAMINOPHEN 325 MG PO TABS
650.0000 mg | ORAL_TABLET | Freq: Once | ORAL | Status: AC
  Administered 2015-09-18: 650 mg via ORAL

## 2015-09-18 MED ORDER — DIPHENHYDRAMINE HCL 25 MG PO CAPS
50.0000 mg | ORAL_CAPSULE | Freq: Once | ORAL | Status: AC
  Administered 2015-09-18: 50 mg via ORAL

## 2015-09-18 MED ORDER — TRASTUZUMAB CHEMO INJECTION 440 MG
8.0000 mg/kg | Freq: Once | INTRAVENOUS | Status: AC
  Administered 2015-09-18: 609 mg via INTRAVENOUS
  Filled 2015-09-18: qty 29

## 2015-09-18 MED ORDER — PALONOSETRON HCL INJECTION 0.25 MG/5ML
INTRAVENOUS | Status: AC
  Filled 2015-09-18: qty 5

## 2015-09-18 MED ORDER — SODIUM CHLORIDE 0.9 % IV SOLN
Freq: Once | INTRAVENOUS | Status: AC
  Administered 2015-09-18: 14:00:00 via INTRAVENOUS
  Filled 2015-09-18: qty 5

## 2015-09-18 MED ORDER — ACETAMINOPHEN 325 MG PO TABS
ORAL_TABLET | ORAL | Status: AC
  Filled 2015-09-18: qty 2

## 2015-09-18 NOTE — Progress Notes (Signed)
I placed fmla forms for [place] on desk of nurse for dr Lindi Adie.

## 2015-09-18 NOTE — Progress Notes (Signed)
Patient Care Team: [name] Patient as PCP - General (General Practice)  DIAGNOSIS: No matching staging information was found for the patient.  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 Patient has had 2 areas of the right breast biopsy along with right axillary lymph node biopsy. We do not have the pathology report from the right axillary lymph node. She is here today to start neoadjuvant chemotherapy with [place]. For some reason she did not get Zofran prescription as well as EMLA cream.  REVIEW OF SYSTEMS:   Constitutional: Denies fevers, chills or abnormal weight loss Eyes: Denies blurriness of vision Ears, nose, mouth, throat, and face: Denies mucositis or sore throat Respiratory: Denies cough, dyspnea or wheezes Cardiovascular: Denies palpitation, chest discomfort or lower extremity swelling Gastrointestinal:  Denies nausea, heartburn or change in bowel habits Skin: Denies abnormal skin rashes Lymphatics: Denies new lymphadenopathy or easy bruising Neurological:Denies numbness, tingling or new  weaknesses Behavioral/Psych: Mood is stable, no new changes   All other systems were reviewed with the patient and are negative.  I have reviewed the past medical history, past surgical history, social history and family history with the patient and they are unchanged from previous note.  ALLERGIES:  has No Known Allergies.  MEDICATIONS:  Current Outpatient Prescriptions  Medication Sig Dispense Refill  . dexamethasone (DECADRON) 4 MG tablet Take 1 tablet (4 mg total) by mouth 2 (two) times daily. Start the day before Taxotere. Then again the day after chemo for 3 days. 30 tablet 1  . HYDROcodone-acetaminophen (NORCO) 5-325 MG tablet Take 1-2 tablets by mouth every 6 (six) hours as needed. 30 tablet 0  . lidocaine-prilocaine (EMLA) cream Apply to affected area once 30 g 3  . LORazepam (ATIVAN) 0.5 MG tablet Take 1 tablet (0.5 mg total) by mouth at bedtime. (Patient taking differently: Take 0.5 mg by mouth 3 times/day as needed-between meals & bedtime for anxiety or sleep. ) 30 tablet 0  . ondansetron (ZOFRAN) 8 MG tablet Take 1 tablet (8 mg total) by mouth 2 (two) times daily. Start the day after chemo for 3 days. Then take as needed for nausea or vomiting. 30 tablet 1  . prochlorperazine (COMPAZINE) 10 MG tablet Take 1 tablet (10 mg total) by mouth every 6 (six) hours as needed (Nausea or vomiting). 30 tablet 1   No current facility-administered medications for this visit.    PHYSICAL EXAMINATION: ECOG PERFORMANCE STATUS: 1 - Symptomatic but completely ambulatory  Filed Vitals:   09/18/15 0833  BP: 116/68  Pulse: 67  Temp: 98.3 F (36.8 C)  Resp: 16   Filed Weights   09/18/15 0833  Weight: 168 lb (76.204 kg)  GENERAL:alert, no distress and comfortable SKIN: skin color, texture, turgor are normal, no rashes or significant lesions EYES: normal, Conjunctiva are pink and non-injected, sclera clear OROPHARYNX:no exudate, no erythema and lips, buccal mucosa, and tongue normal   NECK: supple, thyroid normal size, non-tender, without nodularity LYMPH:  no palpable lymphadenopathy in the cervical, axillary or inguinal LUNGS: clear to auscultation and percussion with normal breathing effort HEART: regular rate & rhythm and no murmurs and no lower extremity edema ABDOMEN:abdomen soft, non-tender and normal bowel sounds Musculoskeletal:no cyanosis of digits and no clubbing  NEURO: alert & oriented x 3 with fluent speech, no focal motor/sensory deficits  LABORATORY DATA:  I have reviewed the data as listed   Chemistry      Component Value Date/Time   NA 136 09/17/2015 0716   K 3.7 09/17/2015 0716   CL 102 09/17/2015 0716   CO2 27 09/17/2015 0716   BUN 9 09/17/2015 0716   CREATININE 0.79 09/17/2015 0716      Component Value Date/Time   CALCIUM 8.8* 09/17/2015 0716       Lab Results  Component Value Date   WBC 13.9* 09/18/2015   HGB 13.8 09/18/2015   HCT 38.9 09/18/2015   MCV 83.5 09/18/2015   PLT 321 09/18/2015   NEUTROABS 12.4* 09/18/2015     RADIOGRAPHIC STUDIES: I have personally reviewed the radiology reports and agreed with their findings. 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 Neo-adjuvant chemotherapy with [place] Perjeta  2. Followed by surgery with most likely mastectomy 3. Followed by  Antiestrogen therapy with tamoxifen ----------------------------------------------------------------------------------------------------------------------- Current Treatment: Today is cycle 1 day 1 ECHO 09/12/15: EF 55-60% Rt Axill LN Biopsy: Pending Reviewed anti emetics RTC in 1 week for toxicity check    No orders of the defined types were placed in this encounter.   The patient has a good understanding of the overall plan. she agrees with it. she will call with any problems that may develop before the next visit here.   Rulon Eisenmenger, MD 09/18/2015

## 2015-09-18 NOTE — Progress Notes (Signed)
FMLA form for pt husband signed and faxed.  Original to scan.  Copy to Managed Care.  Copy given to pt in infusion.

## 2015-09-18 NOTE — Telephone Encounter (Signed)
Appointments made and avs printed for patient °

## 2015-09-18 NOTE — Patient Instructions (Signed)
[facility] Discharge Instructions for Patients Receiving Chemotherapy  Today you received the following chemotherapy agents Herceptin, Perjeta, Taxotere, and Carboplatin  To help prevent nausea and vomiting after your treatment, we encourage you to take your nausea medication Compazine 10mg  every 6 hours as needed   If you develop nausea and vomiting that is not controlled by your nausea medication, call the clinic.   BELOW ARE SYMPTOMS THAT SHOULD BE REPORTED IMMEDIATELY:  *FEVER GREATER THAN 100.5 F  *CHILLS WITH OR WITHOUT FEVER  NAUSEA AND VOMITING THAT IS NOT CONTROLLED WITH YOUR NAUSEA MEDICATION  *UNUSUAL SHORTNESS OF BREATH  *UNUSUAL BRUISING OR BLEEDING  TENDERNESS IN MOUTH AND THROAT WITH OR WITHOUT PRESENCE OF ULCERS  *URINARY PROBLEMS  *BOWEL PROBLEMS  UNUSUAL RASH Items with * indicate a potential emergency and should be followed up as soon as possible.  Feel free to call the clinic you have any questions or concerns. The clinic phone number is [phone].  Please show the [place] [facility] at check-in to the Emergency Department and triage nurse.

## 2015-09-18 NOTE — Addendum Note (Signed)
Addended by: Prentiss Bells on: 09/18/2015 09:57 AM   Modules accepted: Medications

## 2015-09-19 ENCOUNTER — Other Ambulatory Visit (HOSPITAL_COMMUNITY): Payer: BLUE CROSS/BLUE SHIELD

## 2015-09-19 ENCOUNTER — Ambulatory Visit (HOSPITAL_COMMUNITY): Payer: BLUE CROSS/BLUE SHIELD

## 2015-09-20 ENCOUNTER — Telehealth: Payer: Self-pay

## 2015-09-20 NOTE — Telephone Encounter (Signed)
Pt reports a little nausea and fatigue.  Advised pt can add compazine in between zofran doses if needed.  Denies  temps, vomiting, diarrhea, constipation, mouth pain or tenderness.  Reports she is eating and drinking well.  Advised pt to call clinic for any problems or questions.  Pt voiced understanding.

## 2015-09-20 NOTE — Telephone Encounter (Signed)
-----   Message from Domenic Schwab, RN sent at 09/18/2015  3:23 PM EDT ----- Regarding: Lori Dodson F/U 1st Herceptin/Perjeta/Taxotere/Carbo

## 2015-09-21 ENCOUNTER — Ambulatory Visit: Payer: BLUE CROSS/BLUE SHIELD

## 2015-09-25 NOTE — Assessment & Plan Note (Signed)
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 Neo-adjuvant chemotherapy with [place] Perjeta  2. Followed by surgery with most likely mastectomy  3. Followed by Antiestrogen therapy with tamoxifen  -----------------------------------------------------------------------------------------------------------------------  Current Treatment: Today is cycle 1 day 1  ECHO 09/12/15: EF 55-60%  Rt Axill LN Biopsy: Positive  Chemo Toxicities:  RTC in 2 week for cycle 2

## 2015-09-26 ENCOUNTER — Other Ambulatory Visit (HOSPITAL_BASED_OUTPATIENT_CLINIC_OR_DEPARTMENT_OTHER): Payer: BLUE CROSS/BLUE SHIELD

## 2015-09-26 ENCOUNTER — Ambulatory Visit (HOSPITAL_BASED_OUTPATIENT_CLINIC_OR_DEPARTMENT_OTHER): Payer: BLUE CROSS/BLUE SHIELD | Admitting: Hematology and Oncology

## 2015-09-26 ENCOUNTER — Telehealth: Payer: Self-pay | Admitting: Hematology and Oncology

## 2015-09-26 ENCOUNTER — Encounter: Payer: Self-pay | Admitting: Hematology and Oncology

## 2015-09-26 VITALS — BP 116/79 | HR 97 | Temp 98.0°F | Resp 18 | Ht 63.0 in | Wt 166.9 lb

## 2015-09-26 DIAGNOSIS — C50411 Malignant neoplasm of upper-outer quadrant of right female breast: Secondary | ICD-10-CM

## 2015-09-26 DIAGNOSIS — C50511 Malignant neoplasm of lower-outer quadrant of right female breast: Secondary | ICD-10-CM

## 2015-09-26 LAB — CBC WITH DIFFERENTIAL/PLATELET
BASO%: 0.4 % (ref 0.0–2.0)
Basophils Absolute: 0.1 10*3/uL (ref 0.0–0.1)
EOS%: 0.1 % (ref 0.0–7.0)
Eosinophils Absolute: 0 10*3/uL (ref 0.0–0.5)
HCT: 41.2 % (ref 34.8–46.6)
HGB: 13.7 g/dL (ref 11.6–15.9)
LYMPH%: 10.7 % — AB (ref 14.0–49.7)
MCH: 28.3 pg (ref 25.1–34.0)
MCHC: 33.3 g/dL (ref 31.5–36.0)
MCV: 84.9 fL (ref 79.5–101.0)
MONO#: 1.3 10*3/uL — AB (ref 0.1–0.9)
MONO%: 4.1 % (ref 0.0–14.0)
NEUT%: 84.7 % — ABNORMAL HIGH (ref 38.4–76.8)
NEUTROS ABS: 27.4 10*3/uL — AB (ref 1.5–6.5)
Platelets: 156 10*3/uL (ref 145–400)
RBC: 4.86 10*6/uL (ref 3.70–5.45)
RDW: 13.1 % (ref 11.2–14.5)
WBC: 32.4 10*3/uL — AB (ref 3.9–10.3)
lymph#: 3.5 10*3/uL — ABNORMAL HIGH (ref 0.9–3.3)

## 2015-09-26 LAB — COMPREHENSIVE METABOLIC PANEL (CC13)
ALT: 17 U/L (ref 0–55)
AST: 14 U/L (ref 5–34)
Albumin: 3.7 g/dL (ref 3.5–5.0)
Alkaline Phosphatase: 166 U/L — ABNORMAL HIGH (ref 40–150)
Anion Gap: 9 mEq/L (ref 3–11)
BILIRUBIN TOTAL: 0.34 mg/dL (ref 0.20–1.20)
BUN: 8.7 mg/dL (ref 7.0–26.0)
CHLORIDE: 105 meq/L (ref 98–109)
CO2: 25 meq/L (ref 22–29)
CREATININE: 0.9 mg/dL (ref 0.6–1.1)
Calcium: 9.3 mg/dL (ref 8.4–10.4)
EGFR: >90 mL/min/{1.73_m2} (ref >90)
GLUCOSE: 95 mg/dL (ref 70–140)
Potassium: 3.6 mEq/L (ref 3.5–5.1)
SODIUM: 138 meq/L (ref 136–145)
TOTAL PROTEIN: 7.2 g/dL (ref 6.4–8.3)

## 2015-09-26 NOTE — Progress Notes (Signed)
Pt came in my office to return [employer] application. Would like me to look over and make sure she didn't miss anything. Pt also brought lease for rent pymt and light bill. I gave pt W-9 for landlord to complete and asked for proof of her current rent. Pt gave me permission to call energy company to get W-9 completed and faxed from them. Pt will get documents to me or have faxed. I advised pt I would look over her application and contact energy company to get things going.

## 2015-09-26 NOTE — Progress Notes (Signed)
Patient Care Team: [name] Patient as PCP - General (General Practice)  DIAGNOSIS: No matching staging information was found for the patient.  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 He received cycle 1 of [place] last week and is here for toxicity check. After chemotherapy she felt mild nausea for 2-3 days. But starting Friday she had intractable bone pain that lasted for 3-4 days. She also had one day where she had profound diarrhea. Imodium did not seem to relieve the diarrhea. Finally subsided but the subsequent day. The lymph node biopsy under axilla came back positive for malignancy.   REVIEW OF SYSTEMS:   Constitutional: Denies fevers, chills or abnormal weight loss Eyes: Denies blurriness of vision Ears, nose, mouth, throat, and face: Denies mucositis or sore throat, tongue feels like it's coated, decreased taste and appetite Respiratory: Denies cough, dyspnea or wheezes Cardiovascular: Denies palpitation, chest discomfort or lower extremity swelling Gastrointestinal:  Nausea and  diarrhea Skin: Denies abnormal skin rashes Lymphatics: Denies new lymphadenopathy or easy bruising Neurological:Denies numbness, tingling or new weaknesses Behavioral/Psych: Mood is stable, no new changes   All other systems were reviewed with the patient and are negative.  I have reviewed the past medical history, past surgical history, social history and family history with the patient and they are unchanged from previous note.  ALLERGIES:  has No Known Allergies.  MEDICATIONS:  Current Outpatient Prescriptions  Medication Sig Dispense Refill  . dexamethasone (DECADRON) 4 MG tablet Take 1 tablet (4 mg total) by mouth 2 (two) times daily. Start the day before Taxotere. Then again the day after chemo for 3 days. 30 tablet 1  . lidocaine-prilocaine (EMLA) cream Apply to affected area once 30 g 3  . LORazepam (ATIVAN) 0.5 MG tablet Take 1 tablet (0.5 mg total) by mouth at bedtime. (Patient taking differently: Take 0.5 mg by mouth 3 times/day as needed-between meals & bedtime for anxiety or sleep. ) 30 tablet 0  . ondansetron (ZOFRAN) 8 MG tablet Take 1 tablet (8 mg total) by mouth 2 (two) times daily. Start the day after chemo for 3 days. Then take as needed for nausea or vomiting. 30 tablet 1  . oxyCODONE-acetaminophen (PERCOCET/ROXICET) 5-325 MG tablet Take 1 tablet by mouth every 4 (four) hours as needed for severe pain. 30 tablet 0  . prochlorperazine (COMPAZINE) 10 MG tablet Take 1 tablet (10 mg total) by mouth every 6 (six) hours as needed (Nausea or vomiting). 30 tablet 1   No current facility-administered medications for this visit.    PHYSICAL EXAMINATION: ECOG PERFORMANCE STATUS: 1 - Symptomatic but completely ambulatory  Filed Vitals:   09/26/15 1110  BP: 116/79  Pulse: 97  Temp: 98 F (36.7 C)  Resp: 18   Filed Weights   09/26/15 1110  Weight: 166 lb 14.4 oz (75.705 kg)    GENERAL:alert, no distress and comfortable SKIN: skin color, texture, turgor are normal, no  rashes or significant lesions EYES: normal, Conjunctiva are pink and non-injected, sclera clear OROPHARYNX:no exudate, no erythema and lips, buccal mucosa, and tongue normal  NECK: supple, thyroid normal size, non-tender, without nodularity LYMPH:  no palpable lymphadenopathy in the cervical, axillary or inguinal LUNGS: clear to auscultation and percussion with normal breathing effort HEART: regular rate & rhythm and no murmurs and no lower extremity edema ABDOMEN:abdomen soft, non-tender and normal bowel sounds Musculoskeletal:no cyanosis of digits and no clubbing  NEURO: alert & oriented x 3 with fluent speech, no focal motor/sensory deficits   LABORATORY DATA:  I have reviewed the data as listed   Chemistry      Component Value Date/Time   NA 138 09/26/2015 1054   NA 136 09/17/2015 0716   K 3.6 09/26/2015 1054   K 3.7 09/17/2015 0716   CL 102 09/17/2015 0716   CO2 25 09/26/2015 1054   CO2 27 09/17/2015 0716   BUN 8.7 09/26/2015 1054   BUN 9 09/17/2015 0716   CREATININE 0.9 09/26/2015 1054   CREATININE 0.79 09/17/2015 0716      Component Value Date/Time   CALCIUM 9.3 09/26/2015 1054   CALCIUM 8.8* 09/17/2015 0716   ALKPHOS 166* 09/26/2015 1054   AST 14 09/26/2015 1054   ALT 17 09/26/2015 1054   BILITOT 0.34 09/26/2015 1054       Lab Results  Component Value Date   WBC 32.4* 09/26/2015   HGB 13.7 09/26/2015   HCT 41.2 09/26/2015   MCV 84.9 09/26/2015   PLT 156 09/26/2015   NEUTROABS 27.4* 09/26/2015   ASSESSMENT & PLAN:  Breast cancer of upper-outer quadrant of right female breast ([place]) 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 Neo-adjuvant chemotherapy with [place] Perjeta  2. Followed by surgery with most likely mastectomy  3. Followed by Antiestrogen therapy with tamoxifen  -----------------------------------------------------------------------------------------------------------------------  Current Treatment: Today is cycle 1 day 8  ECHO 09/12/15: EF 55-60%  Rt Axill LN Biopsy: Positive  Chemo Toxicities: 1. Nausea grade 1 patient received [place]ox[place] Emend and dexamethasone with chemotherapy. 2. Diarrhea 1 day 3. Severe bone pain related to Neulasta which did not get better in spite of Claritin-D and Tylenol. It was quite unbearable. We will try to do his next cycle of chemotherapy without Neulasta. Blood counts reviewed and her white count was up to 32,000. Because of this reason we decided to hold off on Neulasta and see how she does with the subsequent treatments. If her counts did not recover, then we will need to reinitiate Neulasta later time.  Given the fact that she has positive lymph node I would like to obtain CT scans and bone scan for staging.  RTC in 2 week for cycle  2    Orders Placed This Encounter  Procedures  . CT Chest W Contrast    Standing Status: Future     Number of Occurrences:      Standing Expiration Date: 09/25/2016    Order Specific Question:  If indicated for the ordered procedure, I authorize the administration of contrast media per Radiology protocol    Answer:  Yes    Order Specific Question:  Reason for Exam (SYMPTOM  OR DIAGNOSIS REQUIRED)    Answer:  Lymph node positive breast cancer staging    Order Specific Question:  Is the patient pregnant?    Answer:  No    Order Specific Question:  Preferred imaging location?    Answer:  [facility]  . CT Abdomen  Pelvis W Contrast    Standing Status: Future     Number of Occurrences:      Standing Expiration Date: 09/25/2016    Order Specific Question:  If indicated for the ordered procedure, I authorize the administration of contrast media per Radiology protocol    Answer:  Yes    Order Specific Question:  Reason for Exam (SYMPTOM  OR DIAGNOSIS REQUIRED)    Answer:  Lymph node positive breast cancer staging    Order Specific Question:  Is the patient pregnant?    Answer:  No    Order Specific Question:  Preferred imaging location?    Answer:  [place] Whole Body    Standing Status: Future     Number of Occurrences:      Standing Expiration Date: 09/25/2016    Order Specific Question:  Reason for Exam (SYMPTOM  OR DIAGNOSIS REQUIRED)    Answer:  Lymph node positive breast cancer staging    Order Specific Question:  Is the patient pregnant?    Answer:  No    Order Specific Question:  Preferred imaging location?    Answer:  [facility]    Order Specific Question:  If indicated for the ordered procedure, I authorize the administration of a radiopharmaceutical per Radiology protocol    Answer:  Yes   The patient has a good understanding of the overall plan. she agrees with it. she will call with any problems that may develop before the next visit here.   Rulon Eisenmenger, MD 09/26/2015

## 2015-10-04 ENCOUNTER — Encounter (HOSPITAL_COMMUNITY)
Admission: RE | Admit: 2015-10-04 | Discharge: 2015-10-04 | Disposition: A | Discharge status: Home / Self Care | Payer: BLUE CROSS/BLUE SHIELD | Source: Ambulatory Visit | Attending: Hematology and Oncology | Admitting: Hematology and Oncology

## 2015-10-04 ENCOUNTER — Encounter (HOSPITAL_COMMUNITY): Payer: Self-pay

## 2015-10-04 ENCOUNTER — Ambulatory Visit (HOSPITAL_COMMUNITY)
Admission: RE | Admit: 2015-10-04 | Discharge: 2015-10-04 | Disposition: A | Discharge status: Home / Self Care | Payer: BLUE CROSS/BLUE SHIELD | Source: Ambulatory Visit | Attending: Hematology and Oncology | Admitting: Hematology and Oncology

## 2015-10-04 DIAGNOSIS — R938 Abnormal findings on diagnostic imaging of other specified body structures: Secondary | ICD-10-CM | POA: Insufficient documentation

## 2015-10-04 DIAGNOSIS — C50911 Malignant neoplasm of unspecified site of right female breast: Secondary | ICD-10-CM | POA: Insufficient documentation

## 2015-10-04 DIAGNOSIS — R590 Localized enlarged lymph nodes: Secondary | ICD-10-CM | POA: Diagnosis not present

## 2015-10-04 DIAGNOSIS — C50411 Malignant neoplasm of upper-outer quadrant of right female breast: Secondary | ICD-10-CM

## 2015-10-04 DIAGNOSIS — R911 Solitary pulmonary nodule: Secondary | ICD-10-CM | POA: Insufficient documentation

## 2015-10-04 MED ORDER — TECHNETIUM TC 99M MEDRONATE IV KIT
25.0000 | PACK | Freq: Once | INTRAVENOUS | Status: AC | PRN
  Administered 2015-10-04: 25 via INTRAVENOUS

## 2015-10-04 MED ORDER — IOHEXOL 300 MG/ML  SOLN
100.0000 mL | Freq: Once | INTRAMUSCULAR | Status: AC | PRN
  Administered 2015-10-04: 100 mL via INTRAVENOUS

## 2015-10-09 ENCOUNTER — Ambulatory Visit (HOSPITAL_BASED_OUTPATIENT_CLINIC_OR_DEPARTMENT_OTHER): Payer: BLUE CROSS/BLUE SHIELD

## 2015-10-09 ENCOUNTER — Other Ambulatory Visit (HOSPITAL_BASED_OUTPATIENT_CLINIC_OR_DEPARTMENT_OTHER): Payer: BLUE CROSS/BLUE SHIELD

## 2015-10-09 ENCOUNTER — Encounter: Payer: Self-pay | Admitting: Hematology and Oncology

## 2015-10-09 ENCOUNTER — Encounter: Payer: Self-pay | Admitting: *Deleted

## 2015-10-09 ENCOUNTER — Ambulatory Visit (HOSPITAL_BASED_OUTPATIENT_CLINIC_OR_DEPARTMENT_OTHER): Payer: BLUE CROSS/BLUE SHIELD | Admitting: Hematology and Oncology

## 2015-10-09 ENCOUNTER — Telehealth: Payer: Self-pay | Admitting: Hematology and Oncology

## 2015-10-09 VITALS — BP 114/68 | HR 75 | Temp 98.4°F | Resp 18 | Ht 63.0 in | Wt 170.7 lb

## 2015-10-09 DIAGNOSIS — C50511 Malignant neoplasm of lower-outer quadrant of right female breast: Secondary | ICD-10-CM

## 2015-10-09 DIAGNOSIS — R911 Solitary pulmonary nodule: Secondary | ICD-10-CM

## 2015-10-09 DIAGNOSIS — Z5111 Encounter for antineoplastic chemotherapy: Secondary | ICD-10-CM

## 2015-10-09 DIAGNOSIS — Z5112 Encounter for antineoplastic immunotherapy: Secondary | ICD-10-CM | POA: Diagnosis not present

## 2015-10-09 DIAGNOSIS — C50411 Malignant neoplasm of upper-outer quadrant of right female breast: Secondary | ICD-10-CM

## 2015-10-09 DIAGNOSIS — J9859 Other diseases of mediastinum, not elsewhere classified: Secondary | ICD-10-CM | POA: Diagnosis not present

## 2015-10-09 LAB — CBC WITH DIFFERENTIAL/PLATELET
BASO%: 1 % (ref 0.0–2.0)
BASOS ABS: 0.1 10*3/uL (ref 0.0–0.1)
EOS%: 1.3 % (ref 0.0–7.0)
Eosinophils Absolute: 0.1 10*3/uL (ref 0.0–0.5)
HEMATOCRIT: 35.6 % (ref 34.8–46.6)
HGB: 12.1 g/dL (ref 11.6–15.9)
LYMPH#: 2.4 10*3/uL (ref 0.9–3.3)
LYMPH%: 34.7 % (ref 14.0–49.7)
MCH: 28.8 pg (ref 25.1–34.0)
MCHC: 33.9 g/dL (ref 31.5–36.0)
MCV: 85 fL (ref 79.5–101.0)
MONO#: 0.6 10*3/uL (ref 0.1–0.9)
MONO%: 7.9 % (ref 0.0–14.0)
NEUT#: 3.9 10*3/uL (ref 1.5–6.5)
NEUT%: 55.1 % (ref 38.4–76.8)
Platelets: 379 10*3/uL (ref 145–400)
RBC: 4.19 10*6/uL (ref 3.70–5.45)
RDW: 13.6 % (ref 11.2–14.5)
WBC: 7.1 10*3/uL (ref 3.9–10.3)

## 2015-10-09 LAB — COMPREHENSIVE METABOLIC PANEL (CC13)
ALT: 18 U/L (ref 0–55)
ANION GAP: 7 meq/L (ref 3–11)
AST: 16 U/L (ref 5–34)
Albumin: 3.6 g/dL (ref 3.5–5.0)
Alkaline Phosphatase: 60 U/L (ref 40–150)
BUN: 10.2 mg/dL (ref 7.0–26.0)
CALCIUM: 9.3 mg/dL (ref 8.4–10.4)
CHLORIDE: 109 meq/L (ref 98–109)
CO2: 27 meq/L (ref 22–29)
Creatinine: 0.8 mg/dL (ref 0.6–1.1)
EGFR: >90 mL/min/{1.73_m2} (ref >90)
Glucose: 101 mg/dl (ref 70–140)
POTASSIUM: 4 meq/L (ref 3.5–5.1)
Sodium: 143 mEq/L (ref 136–145)
Total Bilirubin: 0.42 mg/dL (ref 0.20–1.20)
Total Protein: 6.5 g/dL (ref 6.4–8.3)

## 2015-10-09 MED ORDER — DOCETAXEL CHEMO INJECTION 160 MG/16ML
75.0000 mg/m2 | Freq: Once | INTRAVENOUS | Status: AC
  Administered 2015-10-09: 140 mg via INTRAVENOUS
  Filled 2015-10-09: qty 14

## 2015-10-09 MED ORDER — SODIUM CHLORIDE 0.9 % IV SOLN
Freq: Once | INTRAVENOUS | Status: AC
  Administered 2015-10-09: 10:00:00 via INTRAVENOUS
  Filled 2015-10-09: qty 5

## 2015-10-09 MED ORDER — ACETAMINOPHEN 325 MG PO TABS
650.0000 mg | ORAL_TABLET | Freq: Once | ORAL | Status: AC
  Administered 2015-10-09: 650 mg via ORAL

## 2015-10-09 MED ORDER — DIPHENHYDRAMINE HCL 25 MG PO CAPS
ORAL_CAPSULE | ORAL | Status: AC
  Filled 2015-10-09: qty 2

## 2015-10-09 MED ORDER — PALONOSETRON HCL INJECTION 0.25 MG/5ML
0.2500 mg | Freq: Once | INTRAVENOUS | Status: AC
  Administered 2015-10-09: 0.25 mg via INTRAVENOUS

## 2015-10-09 MED ORDER — PALONOSETRON HCL INJECTION 0.25 MG/5ML
INTRAVENOUS | Status: AC
  Filled 2015-10-09: qty 5

## 2015-10-09 MED ORDER — HEPARIN SOD (PORK) LOCK FLUSH 100 UNIT/ML IV SOLN
500.0000 [IU] | Freq: Once | INTRAVENOUS | Status: DC | PRN
  Filled 2015-10-09: qty 5

## 2015-10-09 MED ORDER — DIPHENHYDRAMINE HCL 25 MG PO CAPS
50.0000 mg | ORAL_CAPSULE | Freq: Once | ORAL | Status: AC
  Administered 2015-10-09: 50 mg via ORAL

## 2015-10-09 MED ORDER — SODIUM CHLORIDE 0.9 % IJ SOLN
10.0000 mL | INTRAMUSCULAR | Status: DC | PRN
Start: 2015-10-09 — End: 2015-10-09
  Filled 2015-10-09: qty 10

## 2015-10-09 MED ORDER — ACETAMINOPHEN 325 MG PO TABS
ORAL_TABLET | ORAL | Status: AC
  Filled 2015-10-09: qty 2

## 2015-10-09 MED ORDER — SODIUM CHLORIDE 0.9 % IV SOLN
420.0000 mg | Freq: Once | INTRAVENOUS | Status: AC
  Administered 2015-10-09: 420 mg via INTRAVENOUS
  Filled 2015-10-09: qty 14

## 2015-10-09 MED ORDER — SODIUM CHLORIDE 0.9 % IV SOLN
750.0000 mg | Freq: Once | INTRAVENOUS | Status: AC
  Administered 2015-10-09: 750 mg via INTRAVENOUS
  Filled 2015-10-09: qty 75

## 2015-10-09 MED ORDER — SODIUM CHLORIDE 0.9 % IV SOLN
6.0000 mg/kg | Freq: Once | INTRAVENOUS | Status: AC
  Administered 2015-10-09: 462 mg via INTRAVENOUS
  Filled 2015-10-09: qty 22

## 2015-10-09 NOTE — Assessment & Plan Note (Signed)
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 Metastatic disease), right upper lobe 4 mm lung nodule indeterminate  Recommendation: Genetic counseling  1. Neo-adjuvant chemotherapy with TCH Perjeta  (After neoadjuvant chemotherapy we will obtain breast MRI as well as repeat CT chest to evaluate the lung nodule and mediastinal mass)  2. Followed by surgery with most likely mastectomy  3. Followed by Antiestrogen therapy with tamoxifen  -----------------------------------------------------------------------------------------------------------------------  Current Treatment: Today is cycle 2 day 1  ECHO 09/12/15: EF 55-60%  Rt Axill LN Biopsy: Positive  Chemo Toxicities: 1. Nausea grade 1 patient received Aloxi Emend and dexamethasone with chemotherapy. 2. Diarrhea 1 day 3. Severe bone pain related to Neulasta which did not get better in spite of Claritin-D and Tylenol. It was quite unbearable. We are going to treat cycle 2 without Neulasta.  Radiology review: I discussed the bone scan and CT chest abdomen pelvis reports from 10/04/2015. There was a 1.3 cm left mediastinal mass of unclear etiology along with the 4 mm pulmonary nodule which is also indeterminate. There was left axillary lymphadenopathy which we are aware of. She will need a CT of  the chest after conclusion of treatment to evaluate the mediastinal nodule. I discussed with her that the only way to prove that it is metastatic disease is to have a mediastinoscopy and biopsy.  Return to clinic in 1 week for blood count check since we are not giving Neulasta.

## 2015-10-09 NOTE — Progress Notes (Signed)
Patient Care Team: [name] Patient as PCP - General (General Practice)  DIAGNOSIS: No matching staging information was found for the patient.  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%, HER-2 positive ratio 5.61; right biopsy 8:00: IDC grade 3, ER 50%, PR 5%, Ki-67 80%, HER-2 positive ratio 5.38   09/11/2015 Breast MRI Right breast middle depth: 2.7 cm; right breast posterior outer: 4.4 cm; right breast retroareolar 2.7 cm; probable right axillary and right axillary tail lymphadenopathy   09/18/2015 -  Neo-Adjuvant Chemotherapy [facility] Perjeta 6    CHIEF COMPLIANT: Cycle to [facility]  INTERVAL HISTORY: Lori Dodson is a 29 year old with above-mentioned history of right-sided breast cancer currently on neoadjuvant chemotherapy with [place] Perjeta. She did well for the past 2 weeks without any problems or concerns. Energy levels have recovered. Her taste has not come back. Did not have any nausea or vomiting. The bone pain related to Neulasta has subsided. Her bowels are moving normally.  REVIEW OF SYSTEMS:   Constitutional: Denies fevers, chills or abnormal weight loss Eyes: Denies blurriness of vision Ears, nose, mouth, throat, and face: Denies mucositis or sore throat Respiratory: Denies cough, dyspnea or wheezes Cardiovascular: Denies palpitation, chest discomfort or lower extremity swelling Gastrointestinal:  Denies nausea, heartburn or change in bowel habits Skin: Denies abnormal skin rashes Lymphatics: Denies new lymphadenopathy or easy bruising Neurological:Denies numbness, tingling or new weaknesses Behavioral/Psych: Mood is stable, no new  changes  All other systems were reviewed with the patient and are negative.  I have reviewed the past medical history, past surgical history, social history and family history with the patient and they are unchanged from previous note.  ALLERGIES:  has No Known Allergies.  MEDICATIONS:  Current Outpatient Prescriptions  Medication Sig Dispense Refill  . dexamethasone (DECADRON) 4 MG tablet Take 1 tablet (4 mg total) by mouth 2 (two) times daily. Start the day before Taxotere. Then again the day after chemo for 3 days. 30 tablet 1  . lidocaine-prilocaine (EMLA) cream Apply to affected area once 30 g 3  . LORazepam (ATIVAN) 0.5 MG tablet Take 1 tablet (0.5 mg total) by mouth at bedtime. (Patient taking differently: Take 0.5 mg by mouth 3 times/day as needed-between meals & bedtime for anxiety or sleep. ) 30 tablet 0  . ondansetron (ZOFRAN) 8 MG tablet Take 1 tablet (8 mg total) by mouth 2 (two) times daily. Start the day after chemo for 3 days. Then take as needed for nausea or vomiting. 30 tablet 1  . oxyCODONE-acetaminophen (PERCOCET/ROXICET) 5-325 MG tablet Take 1 tablet by mouth every 4 (four) hours as needed for severe pain. 30 tablet 0  . prochlorperazine (COMPAZINE) 10 MG tablet Take 1 tablet (10 mg total) by mouth every 6 (six) hours as needed (Nausea or vomiting). 30 tablet 1   No current facility-administered medications for this visit.    PHYSICAL EXAMINATION: ECOG PERFORMANCE STATUS: 1 - Symptomatic but completely ambulatory  Filed Vitals:   10/09/15 0832  BP: 114/68  Pulse: 75  Temp: 98.4 F (36.9 C)  Resp: 18   Filed Weights   10/09/15 0832  Weight: 170 lb 11.2 oz (77.429 kg)    GENERAL:alert, no  distress and comfortable SKIN: skin color, texture, turgor are normal, no rashes or significant lesions EYES: normal, Conjunctiva are pink and non-injected, sclera clear OROPHARYNX:no exudate, no erythema and lips, buccal mucosa, and tongue normal  NECK: supple, thyroid  normal size, non-tender, without nodularity LYMPH:  no palpable lymphadenopathy in the cervical, axillary or inguinal LUNGS: clear to auscultation and percussion with normal breathing effort HEART: regular rate & rhythm and no murmurs and no lower extremity edema ABDOMEN:abdomen soft, non-tender and normal bowel sounds Musculoskeletal:no cyanosis of digits and no clubbing  NEURO: alert & oriented x 3 with fluent speech, no focal motor/sensory deficits  LABORATORY DATA:  I have reviewed the data as listed   Chemistry      Component Value Date/Time   NA 143 10/09/2015 0817   NA 136 09/17/2015 0716   K 4.0 10/09/2015 0817   K 3.7 09/17/2015 0716   CL 102 09/17/2015 0716   CO2 27 10/09/2015 0817   CO2 27 09/17/2015 0716   BUN 10.2 10/09/2015 0817   BUN 9 09/17/2015 0716   CREATININE 0.8 10/09/2015 0817   CREATININE 0.79 09/17/2015 0716      Component Value Date/Time   CALCIUM 9.3 10/09/2015 0817   CALCIUM 8.8* 09/17/2015 0716   ALKPHOS 60 10/09/2015 0817   AST 16 10/09/2015 0817   ALT 18 10/09/2015 0817   BILITOT 0.42 10/09/2015 0817       Lab Results  Component Value Date   WBC 7.1 10/09/2015   HGB 12.1 10/09/2015   HCT 35.6 10/09/2015   MCV 85.0 10/09/2015   PLT 379 10/09/2015   NEUTROABS 3.9 10/09/2015   ASSESSMENT & PLAN:  Breast cancer of upper-outer quadrant of right female breast ([place]) 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%, PR 0%, 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 Metastatic disease), right upper lobe 4 mm lung nodule indeterminate  Recommendation: Genetic counseling  1. Neo-adjuvant chemotherapy with [facility] Perjeta  (After neoadjuvant chemotherapy we will obtain breast MRI as well as repeat CT chest to evaluate the lung nodule and mediastinal mass)  2. Followed by surgery with most likely mastectomy  3. Followed by Antiestrogen therapy with tamoxifen  -----------------------------------------------------------------------------------------------------------------------  Current Treatment: Today is cycle 2 day 1  ECHO 09/12/15: EF 55-60%  Rt Axill LN Biopsy: Positive  Chemo Toxicities: 1. Nausea grade 1 patient received [place]ox[place] Emend and dexamethasone with chemotherapy. 2. Diarrhea 1 day 3. Severe bone pain related to Neulasta which did not get better in spite of Claritin-D and Tylenol. It was quite unbearable. We are going to treat cycle 2 without Neulasta.  Radiology review: I discussed the bone scan and CT chest abdomen pelvis reports from 10/04/2015. There was a 1.3 cm left mediastinal mass of unclear etiology along with the 4 mm pulmonary nodule which is also indeterminate. There was left axillary lymphadenopathy which we are aware of. She will need a CT of the chest after conclusion of treatment  to evaluate the mediastinal nodule. I discussed with her that the only way to prove that it is metastatic disease is to have a mediastinoscopy and biopsy.  Return to clinic in 1 week for blood count check since we are not giving Neulasta.    No orders of the defined types were placed in this encounter.   The patient has a good understanding of the overall plan. she agrees with it. she will call with any problems that may develop before the next visit here.   Rulon Eisenmenger, MD 10/09/2015

## 2015-10-09 NOTE — Patient Instructions (Signed)
[facility] Discharge Instructions for Patients Receiving Chemotherapy  Today you received the following chemotherapy agents: Taxotere, Carboplatin, Herceptin and Perjeta.   To help prevent nausea and vomiting after your treatment, we encourage you to take your nausea medication as directed.    If you develop nausea and vomiting that is not controlled by your nausea medication, call the clinic.   BELOW ARE SYMPTOMS THAT SHOULD BE REPORTED IMMEDIATELY:  *FEVER GREATER THAN 100.5 F  *CHILLS WITH OR WITHOUT FEVER  NAUSEA AND VOMITING THAT IS NOT CONTROLLED WITH YOUR NAUSEA MEDICATION  *UNUSUAL SHORTNESS OF BREATH  *UNUSUAL BRUISING OR BLEEDING  TENDERNESS IN MOUTH AND THROAT WITH OR WITHOUT PRESENCE OF ULCERS  *URINARY PROBLEMS  *BOWEL PROBLEMS  UNUSUAL RASH Items with * indicate a potential emergency and should be followed up as soon as possible.  Feel free to call the clinic you have any questions or concerns. The clinic phone number is [phone].  Please show the CHEMO ALERT CARD at check-in to the Emergency Department and triage nurse.   

## 2015-10-09 NOTE — Telephone Encounter (Signed)
Appointments made and avs printed for patient °

## 2015-10-16 ENCOUNTER — Ambulatory Visit (HOSPITAL_BASED_OUTPATIENT_CLINIC_OR_DEPARTMENT_OTHER): Payer: BLUE CROSS/BLUE SHIELD | Admitting: Hematology and Oncology

## 2015-10-16 ENCOUNTER — Other Ambulatory Visit (HOSPITAL_BASED_OUTPATIENT_CLINIC_OR_DEPARTMENT_OTHER): Payer: BLUE CROSS/BLUE SHIELD

## 2015-10-16 ENCOUNTER — Encounter: Payer: Self-pay | Admitting: Hematology and Oncology

## 2015-10-16 ENCOUNTER — Telehealth: Payer: Self-pay | Admitting: Hematology and Oncology

## 2015-10-16 VITALS — BP 107/76 | HR 80 | Temp 97.1°F | Resp 18 | Ht 63.0 in | Wt 168.5 lb

## 2015-10-16 DIAGNOSIS — C50411 Malignant neoplasm of upper-outer quadrant of right female breast: Secondary | ICD-10-CM | POA: Diagnosis not present

## 2015-10-16 DIAGNOSIS — R222 Localized swelling, mass and lump, trunk: Secondary | ICD-10-CM | POA: Diagnosis not present

## 2015-10-16 DIAGNOSIS — R911 Solitary pulmonary nodule: Secondary | ICD-10-CM

## 2015-10-16 LAB — COMPREHENSIVE METABOLIC PANEL (CC13)
ALT: 20 U/L (ref 0–55)
ANION GAP: 9 meq/L (ref 3–11)
AST: 14 U/L (ref 5–34)
Albumin: 3.8 g/dL (ref 3.5–5.0)
Alkaline Phosphatase: 64 U/L (ref 40–150)
BILIRUBIN TOTAL: 1.01 mg/dL (ref 0.20–1.20)
BUN: 12.4 mg/dL (ref 7.0–26.0)
CALCIUM: 9.3 mg/dL (ref 8.4–10.4)
CHLORIDE: 104 meq/L (ref 98–109)
CO2: 25 mEq/L (ref 22–29)
CREATININE: 0.9 mg/dL (ref 0.6–1.1)
EGFR: >90 mL/min/{1.73_m2} (ref >90)
Glucose: 83 mg/dl (ref 70–140)
Potassium: 4.3 mEq/L (ref 3.5–5.1)
Sodium: 138 mEq/L (ref 136–145)
Total Protein: 6.9 g/dL (ref 6.4–8.3)

## 2015-10-16 LAB — CBC WITH DIFFERENTIAL/PLATELET
BASO%: 2.5 % — ABNORMAL HIGH (ref 0.0–2.0)
BASOS ABS: 0.1 10*3/uL (ref 0.0–0.1)
EOS ABS: 0.1 10*3/uL (ref 0.0–0.5)
EOS%: 1.5 % (ref 0.0–7.0)
HEMATOCRIT: 37.1 % (ref 34.8–46.6)
HEMOGLOBIN: 12.9 g/dL (ref 11.6–15.9)
LYMPH%: 59.4 % — ABNORMAL HIGH (ref 14.0–49.7)
MCH: 29.4 pg (ref 25.1–34.0)
MCHC: 34.8 g/dL (ref 31.5–36.0)
MCV: 84.5 fL (ref 79.5–101.0)
MONO#: 0.1 10*3/uL (ref 0.1–0.9)
MONO%: 3.4 % (ref 0.0–14.0)
NEUT%: 33.2 % — ABNORMAL LOW (ref 38.4–76.8)
NEUTROS ABS: 1.1 10*3/uL — AB (ref 1.5–6.5)
PLATELETS: 221 10*3/uL (ref 145–400)
RBC: 4.39 10*6/uL (ref 3.70–5.45)
RDW: 13.1 % (ref 11.2–14.5)
WBC: 3.3 10*3/uL — AB (ref 3.9–10.3)
lymph#: 1.9 10*3/uL (ref 0.9–3.3)

## 2015-10-16 MED ORDER — CLINDAMYCIN PHOSPHATE 1 % EX GEL: Freq: Two times a day (BID) | CUTANEOUS | Status: DC

## 2015-10-16 NOTE — Progress Notes (Signed)
Patient Care Team: [name] Patient as PCP - General (General Practice)  DIAGNOSIS: No matching staging information was found for the patient.  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 She did not get Neulasta injection after cycle 2 because of diffuse bony pain related to Neulasta. She is here for nadir count check. She reports that she done extremely well. She had 1-2 days of mild nausea. She had no bone pain.  REVIEW OF SYSTEMS:   Constitutional: Denies fevers, chills or abnormal weight loss Eyes: Denies blurriness of vision Ears, nose, mouth, throat, and face: Denies mucositis or sore throat Respiratory: Denies cough, dyspnea or wheezes Cardiovascular: Denies palpitation, chest discomfort or lower extremity swelling Gastrointestinal:  Denies nausea, heartburn or change in bowel habits Skin: Denies abnormal skin rashes Lymphatics: Denies new lymphadenopathy or easy bruising Neurological:Denies numbness, tingling or new weaknesses Behavioral/Psych: Mood is stable, no  new changes  Breast: The breast lump has resolved to palpation All other systems were reviewed with the patient and are negative.  I have reviewed the past medical history, past surgical history, social history and family history with the patient and they are unchanged from previous note.  ALLERGIES:  has No Known Allergies.  MEDICATIONS:  Current Outpatient Prescriptions  Medication Sig Dispense Refill  . clindamycin (CLINDAGEL) 1 % gel Apply topically 2 (two) times daily. 30 g 0  . dexamethasone (DECADRON) 4 MG tablet Take 1 tablet (4 mg total) by mouth 2 (two) times daily. Start the day before Taxotere. Then again the day after chemo for 3 days. 30 tablet 1  . lidocaine-prilocaine (EMLA) cream Apply to affected area once 30 g 3  . LORazepam (ATIVAN) 0.5 MG tablet Take 1 tablet (0.5 mg total) by mouth at bedtime. (Patient taking differently: Take 0.5 mg by mouth 3 times/day as needed-between meals & bedtime for anxiety or sleep. ) 30 tablet 0  . ondansetron (ZOFRAN) 8 MG tablet Take 1 tablet (8 mg total) by mouth 2 (two) times daily. Start the day after chemo for 3 days. Then take as needed for nausea or vomiting. 30 tablet 1  . oxyCODONE-acetaminophen (PERCOCET/ROXICET) 5-325 MG tablet Take 1 tablet by mouth every 4 (four) hours as needed for severe pain. 30 tablet 0  . prochlorperazine (COMPAZINE) 10 MG tablet Take 1 tablet (10 mg total) by mouth every 6 (six) hours as needed (Nausea or vomiting). 30 tablet 1   No current facility-administered medications for this visit.    PHYSICAL EXAMINATION: ECOG PERFORMANCE STATUS: 1 - Symptomatic but completely ambulatory  Filed Vitals:   10/16/15 1024  BP: 107/76  Pulse: 80  Temp: 97.1 F (36.2 C)  Resp: 18   Filed Weights   10/16/15 1024  Weight: 168 lb 8 oz (76.431 kg)    GENERAL:alert, no distress and comfortable SKIN: skin color, texture, turgor are normal, no rashes or significant lesions EYES: normal, Conjunctiva are pink and  non-injected, sclera clear OROPHARYNX:no exudate, no erythema and lips, buccal mucosa, and tongue normal  NECK: supple, thyroid normal size, non-tender, without nodularity LYMPH:  no palpable lymphadenopathy in the cervical, axillary or inguinal LUNGS: clear to auscultation and percussion with normal breathing effort HEART: regular rate & rhythm and no murmurs and no lower extremity edema ABDOMEN:abdomen soft, non-tender and normal bowel sounds Musculoskeletal:no cyanosis of digits and no clubbing  NEURO: alert & oriented x 3 with fluent speech, no focal motor/sensory deficits   LABORATORY DATA:  I have reviewed the data as listed   Chemistry      Component Value Date/Time   NA 138 10/16/2015 1011   NA 136 09/17/2015 0716   K 4.3 10/16/2015 1011   K 3.7 09/17/2015 0716   CL 102 09/17/2015 0716   CO2 25 10/16/2015 1011   CO2 27 09/17/2015 0716   BUN 12.4 10/16/2015 1011   BUN 9 09/17/2015 0716   CREATININE 0.9 10/16/2015 1011   CREATININE 0.79 09/17/2015 0716      Component Value Date/Time   CALCIUM 9.3 10/16/2015 1011   CALCIUM 8.8* 09/17/2015 0716   ALKPHOS 64 10/16/2015 1011   AST 14 10/16/2015 1011   ALT 20 10/16/2015 1011   BILITOT 1.01 10/16/2015 1011       Lab Results  Component Value Date   WBC 3.3* 10/16/2015   HGB 12.9 10/16/2015   HCT 37.1 10/16/2015   MCV 84.5 10/16/2015   PLT 221 10/16/2015   NEUTROABS 1.1* 10/16/2015    ASSESSMENT & PLAN:  Breast cancer of upper-outer quadrant of right female breast ([place]) 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%, PR 0%, 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 Metastatic disease), right upper lobe 4 mm lung nodule indeterminate  Recommendation: Genetic counseling  1. Neo-adjuvant chemotherapy with [facility] Perjeta  (After neoadjuvant chemotherapy we will obtain breast MRI as well as repeat CT chest to evaluate the lung nodule and mediastinal mass)  2. Followed by surgery with most likely mastectomy  3. Followed by Antiestrogen therapy with tamoxifen  -----------------------------------------------------------------------------------------------------------------------  Current Treatment: Today is cycle 2 day 8  ECHO 09/12/15: EF 55-60%  Rt Axill LN Biopsy: Positive  Chemo Toxicities: 1. Nausea grade 1 patient received Aloxi Emend and dexamethasone with chemotherapy. 2. Diarrhea 1 day 3. Severe bone pain related to Neulasta which did not get better in spite of Claritin-D and Tylenol. It was quite unbearable. We treated her without Neulasta from cycle 2. Today's nadir blood counts revealed an ANC of 1.1. I discussed with her that we will wait to see the results of her CBC with cycle 3. If her counts  recover fully, then she will not need Neulasta with subsequent chemotherapy treatments.  Return to clinic in 2 weeks for blood count check and for cycle 3.  No orders of the defined types were placed in this encounter.   The patient has a good understanding of the overall plan. she agrees with it. she will call with any problems that may develop before the next visit here.   Rulon Eisenmenger, MD 10/16/2015

## 2015-10-16 NOTE — Assessment & Plan Note (Signed)
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 Metastatic disease), right upper lobe 4 mm lung nodule indeterminate  Recommendation: Genetic counseling  1. Neo-adjuvant chemotherapy with [facility] Perjeta  (After neoadjuvant chemotherapy we will obtain breast MRI as well as repeat CT chest to evaluate the lung nodule and mediastinal mass)  2. Followed by surgery with most likely mastectomy  3. Followed by Antiestrogen therapy with tamoxifen  -----------------------------------------------------------------------------------------------------------------------  Current Treatment: Today is cycle 2 day 8  ECHO 09/12/15: EF 55-60%  Rt Axill LN Biopsy: Positive  Chemo Toxicities: 1. Nausea grade 1 patient received Aloxi Emend and dexamethasone with chemotherapy. 2. Diarrhea 1 day 3. Severe bone pain related to Neulasta which did not get better in spite of Claritin-D and Tylenol. It was quite unbearable. We are going to treat cycle 2 without Neulasta.  Return to clinic in 2 weeks for blood count check and for cycle 3.

## 2015-10-16 NOTE — Addendum Note (Signed)
Addended by: Prentiss Bells on: 10/16/2015 03:04 PM   Modules accepted: Medications

## 2015-10-16 NOTE — Telephone Encounter (Signed)
Appointments made and avs pritned for patient °

## 2015-10-30 ENCOUNTER — Other Ambulatory Visit: Payer: BLUE CROSS/BLUE SHIELD

## 2015-10-30 ENCOUNTER — Other Ambulatory Visit (HOSPITAL_BASED_OUTPATIENT_CLINIC_OR_DEPARTMENT_OTHER): Payer: BLUE CROSS/BLUE SHIELD

## 2015-10-30 ENCOUNTER — Telehealth: Payer: Self-pay | Admitting: Hematology and Oncology

## 2015-10-30 ENCOUNTER — Ambulatory Visit (HOSPITAL_BASED_OUTPATIENT_CLINIC_OR_DEPARTMENT_OTHER): Payer: BLUE CROSS/BLUE SHIELD

## 2015-10-30 ENCOUNTER — Ambulatory Visit (HOSPITAL_BASED_OUTPATIENT_CLINIC_OR_DEPARTMENT_OTHER): Payer: BLUE CROSS/BLUE SHIELD | Admitting: Hematology and Oncology

## 2015-10-30 ENCOUNTER — Encounter: Payer: Self-pay | Admitting: Hematology and Oncology

## 2015-10-30 ENCOUNTER — Encounter: Payer: Self-pay | Admitting: *Deleted

## 2015-10-30 VITALS — BP 111/68 | HR 69 | Temp 97.6°F | Resp 18 | Ht 63.0 in | Wt 169.0 lb

## 2015-10-30 DIAGNOSIS — C50411 Malignant neoplasm of upper-outer quadrant of right female breast: Secondary | ICD-10-CM

## 2015-10-30 DIAGNOSIS — Z5111 Encounter for antineoplastic chemotherapy: Secondary | ICD-10-CM

## 2015-10-30 DIAGNOSIS — R911 Solitary pulmonary nodule: Secondary | ICD-10-CM

## 2015-10-30 DIAGNOSIS — R197 Diarrhea, unspecified: Secondary | ICD-10-CM | POA: Diagnosis not present

## 2015-10-30 DIAGNOSIS — Z5112 Encounter for antineoplastic immunotherapy: Secondary | ICD-10-CM

## 2015-10-30 DIAGNOSIS — J9859 Other diseases of mediastinum, not elsewhere classified: Secondary | ICD-10-CM

## 2015-10-30 LAB — COMPREHENSIVE METABOLIC PANEL (CC13)
ALBUMIN: 3.9 g/dL (ref 3.5–5.0)
ALK PHOS: 61 U/L (ref 40–150)
ALT: 30 U/L (ref 0–55)
ANION GAP: 10 meq/L (ref 3–11)
AST: 16 U/L (ref 5–34)
BUN: 11.3 mg/dL (ref 7.0–26.0)
CHLORIDE: 108 meq/L (ref 98–109)
CO2: 23 mEq/L (ref 22–29)
CREATININE: 0.8 mg/dL (ref 0.6–1.1)
Calcium: 9.7 mg/dL (ref 8.4–10.4)
EGFR: >90 mL/min/{1.73_m2} (ref >90)
GLUCOSE: 138 mg/dL (ref 70–140)
Potassium: 3.9 mEq/L (ref 3.5–5.1)
Sodium: 141 mEq/L (ref 136–145)
TOTAL PROTEIN: 7.3 g/dL (ref 6.4–8.3)
Total Bilirubin: 0.63 mg/dL (ref 0.20–1.20)

## 2015-10-30 LAB — CBC WITH DIFFERENTIAL/PLATELET
BASO%: 0 % (ref 0.0–2.0)
Basophils Absolute: 0 10*3/uL (ref 0.0–0.1)
EOS%: 0 % (ref 0.0–7.0)
Eosinophils Absolute: 0 10*3/uL (ref 0.0–0.5)
HCT: 34.2 % — ABNORMAL LOW (ref 34.8–46.6)
HEMOGLOBIN: 12 g/dL (ref 11.6–15.9)
LYMPH#: 2 10*3/uL (ref 0.9–3.3)
LYMPH%: 18.2 % (ref 14.0–49.7)
MCH: 29.3 pg (ref 25.1–34.0)
MCHC: 35.1 g/dL (ref 31.5–36.0)
MCV: 83.6 fL (ref 79.5–101.0)
MONO#: 0.4 10*3/uL (ref 0.1–0.9)
MONO%: 3.1 % (ref 0.0–14.0)
NEUT%: 78.7 % — ABNORMAL HIGH (ref 38.4–76.8)
NEUTROS ABS: 8.8 10*3/uL — AB (ref 1.5–6.5)
PLATELETS: 229 10*3/uL (ref 145–400)
RBC: 4.09 10*6/uL (ref 3.70–5.45)
RDW: 14.9 % — AB (ref 11.2–14.5)
WBC: 11.2 10*3/uL — AB (ref 3.9–10.3)

## 2015-10-30 MED ORDER — SODIUM CHLORIDE 0.9 % IV SOLN
420.0000 mg | Freq: Once | INTRAVENOUS | Status: AC
  Administered 2015-10-30: 420 mg via INTRAVENOUS
  Filled 2015-10-30: qty 14

## 2015-10-30 MED ORDER — HEPARIN SOD (PORK) LOCK FLUSH 100 UNIT/ML IV SOLN
500.0000 [IU] | Freq: Once | INTRAVENOUS | Status: AC | PRN
  Administered 2015-10-30: 500 [IU]
  Filled 2015-10-30: qty 5

## 2015-10-30 MED ORDER — SODIUM CHLORIDE 0.9 % IV SOLN
Freq: Once | INTRAVENOUS | Status: AC
  Administered 2015-10-30: 09:00:00 via INTRAVENOUS

## 2015-10-30 MED ORDER — SODIUM CHLORIDE 0.9 % IV SOLN
Freq: Once | INTRAVENOUS | Status: AC
  Administered 2015-10-30: 11:00:00 via INTRAVENOUS
  Filled 2015-10-30: qty 5

## 2015-10-30 MED ORDER — DIPHENHYDRAMINE HCL 25 MG PO CAPS
ORAL_CAPSULE | ORAL | Status: AC
  Filled 2015-10-30: qty 2

## 2015-10-30 MED ORDER — SODIUM CHLORIDE 0.9 % IJ SOLN
10.0000 mL | INTRAMUSCULAR | Status: DC | PRN
  Administered 2015-10-30: 10 mL
  Filled 2015-10-30: qty 10

## 2015-10-30 MED ORDER — ACETAMINOPHEN 325 MG PO TABS
650.0000 mg | ORAL_TABLET | Freq: Once | ORAL | Status: AC
  Administered 2015-10-30: 650 mg via ORAL

## 2015-10-30 MED ORDER — ACETAMINOPHEN 325 MG PO TABS
ORAL_TABLET | ORAL | Status: AC
  Filled 2015-10-30: qty 2

## 2015-10-30 MED ORDER — PALONOSETRON HCL INJECTION 0.25 MG/5ML
0.2500 mg | Freq: Once | INTRAVENOUS | Status: AC
  Administered 2015-10-30: 0.25 mg via INTRAVENOUS

## 2015-10-30 MED ORDER — DOCETAXEL CHEMO INJECTION 160 MG/16ML
75.0000 mg/m2 | Freq: Once | INTRAVENOUS | Status: AC
  Administered 2015-10-30: 140 mg via INTRAVENOUS
  Filled 2015-10-30: qty 14

## 2015-10-30 MED ORDER — PALONOSETRON HCL INJECTION 0.25 MG/5ML
INTRAVENOUS | Status: AC
  Filled 2015-10-30: qty 5

## 2015-10-30 MED ORDER — SODIUM CHLORIDE 0.9 % IV SOLN
750.0000 mg | Freq: Once | INTRAVENOUS | Status: AC
  Administered 2015-10-30: 750 mg via INTRAVENOUS
  Filled 2015-10-30: qty 75

## 2015-10-30 MED ORDER — DIPHENHYDRAMINE HCL 25 MG PO CAPS
50.0000 mg | ORAL_CAPSULE | Freq: Once | ORAL | Status: AC
  Administered 2015-10-30: 50 mg via ORAL

## 2015-10-30 MED ORDER — TRASTUZUMAB CHEMO INJECTION 440 MG
6.0000 mg/kg | Freq: Once | INTRAVENOUS | Status: AC
  Administered 2015-10-30: 462 mg via INTRAVENOUS
  Filled 2015-10-30: qty 22

## 2015-10-30 NOTE — Addendum Note (Signed)
Addended by: Prentiss Bells on: 10/30/2015 09:44 AM   Modules accepted: Medications

## 2015-10-30 NOTE — Progress Notes (Signed)
Patient Care Team: [name] Patient as PCP - General (General Practice)  DIAGNOSIS: No matching staging information was found for the patient.  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 She is tolerating chemotherapy extremely well since we stopped Neulasta. She no longer has the severe bone pain. She has very mild diarrhea. She denies any nausea or vomiting.  REVIEW OF SYSTEMS:   Constitutional: Denies fevers, chills or abnormal weight loss Eyes: Denies blurriness of vision Ears, nose, mouth, throat, and face: Denies mucositis or sore throat Respiratory: Denies cough, dyspnea or wheezes Cardiovascular: Denies palpitation, chest discomfort or lower extremity swelling Gastrointestinal:  Diarrhea grade 1 Skin: Denies abnormal skin rashes Lymphatics: Denies new lymphadenopathy or easy bruising Neurological:Denies numbness, tingling or new weaknesses Behavioral/Psych: Mood is stable, no new changes   All other systems were reviewed with the patient and are negative.  I have reviewed the past  medical history, past surgical history, social history and family history with the patient and they are unchanged from previous note.  ALLERGIES:  has No Known Allergies.  MEDICATIONS:  Current Outpatient Prescriptions  Medication Sig Dispense Refill  . clindamycin (CLINDAGEL) 1 % gel Apply topically 2 (two) times daily. 30 g 0  . dexamethasone (DECADRON) 4 MG tablet Take 1 tablet (4 mg total) by mouth 2 (two) times daily. Start the day before Taxotere. Then again the day after chemo for 3 days. 30 tablet 1  . lidocaine-prilocaine (EMLA) cream Apply to affected area once 30 g 3  . LORazepam (ATIVAN) 0.5 MG tablet Take 1 tablet (0.5 mg total) by mouth at bedtime. (Patient taking differently: Take 0.5 mg by mouth 3 times/day as needed-between meals & bedtime for anxiety or sleep. ) 30 tablet 0  . ondansetron (ZOFRAN) 8 MG tablet Take 1 tablet (8 mg total) by mouth 2 (two) times daily. Start the day after chemo for 3 days. Then take as needed for nausea or vomiting. 30 tablet 1  . oxyCODONE-acetaminophen (PERCOCET/ROXICET) 5-325 MG tablet Take 1 tablet by mouth every 4 (four) hours as needed for severe pain. 30 tablet 0  . prochlorperazine (COMPAZINE) 10 MG tablet Take 1 tablet (10 mg total) by mouth every 6 (six) hours as needed (Nausea or vomiting). 30 tablet 1   No current facility-administered medications for this visit.    PHYSICAL EXAMINATION: ECOG PERFORMANCE STATUS: 1 - Symptomatic but completely ambulatory  There were no vitals filed for this visit. There were no vitals filed for this visit.  GENERAL:alert, no distress and comfortable SKIN: skin color, texture, turgor are normal, no rashes or significant lesions EYES: normal, Conjunctiva are pink and non-injected, sclera clear OROPHARYNX:no  exudate, no erythema and lips, buccal mucosa, and tongue normal  NECK: supple, thyroid normal size, non-tender, without nodularity LYMPH:  no palpable lymphadenopathy in the cervical, axillary  or inguinal LUNGS: clear to auscultation and percussion with normal breathing effort HEART: regular rate & rhythm and no murmurs and no lower extremity edema ABDOMEN:abdomen soft, non-tender and normal bowel sounds Musculoskeletal:no cyanosis of digits and no clubbing  NEURO: alert & oriented x 3 with fluent speech, no focal motor/sensory deficits  LABORATORY DATA:  I have reviewed the data as listed   Chemistry      Component Value Date/Time   NA 138 10/16/2015 1011   NA 136 09/17/2015 0716   K 4.3 10/16/2015 1011   K 3.7 09/17/2015 0716   CL 102 09/17/2015 0716   CO2 25 10/16/2015 1011   CO2 27 09/17/2015 0716   BUN 12.4 10/16/2015 1011   BUN 9 09/17/2015 0716   CREATININE 0.9 10/16/2015 1011   CREATININE 0.79 09/17/2015 0716      Component Value Date/Time   CALCIUM 9.3 10/16/2015 1011   CALCIUM 8.8* 09/17/2015 0716   ALKPHOS 64 10/16/2015 1011   AST 14 10/16/2015 1011   ALT 20 10/16/2015 1011   BILITOT 1.01 10/16/2015 1011       Lab Results  Component Value Date   WBC 3.3* 10/16/2015   HGB 12.9 10/16/2015   HCT 37.1 10/16/2015   MCV 84.5 10/16/2015   PLT 221 10/16/2015   NEUTROABS 1.1* 10/16/2015   ASSESSMENT & PLAN:  Breast cancer of upper-outer quadrant of right female breast ([place]) 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%, PR 0%, 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 Metastatic disease), right upper lobe 4 mm lung nodule indeterminate  Recommendation: Genetic counseling  1. Neo-adjuvant chemotherapy with TCH Perjeta  (After neoadjuvant chemotherapy we will obtain breast MRI as well as repeat CT chest to evaluate the lung nodule and mediastinal mass)  2. Followed by surgery with most likely mastectomy  3. Followed by Antiestrogen therapy with tamoxifen  -----------------------------------------------------------------------------------------------------------------------  Current Treatment: Today is cycle 3 day 1  ECHO 09/12/15: EF 55-60%  Rt Axill LN Biopsy: Positive   Chemo Toxicities: 1. Nausea grade 1 patient received [place]ox[place] Emend and dexamethasone with chemotherapy. 2. Diarrhea 1 day 3. Severe bone pain related to Neulasta: D/C ed neulasta with cycle 2. Monitoring closely for chemotherapy toxicities Intermittent migraine headaches  Return to clinic in 3 weeks for blood count check and for cycle 4.   No orders of the defined types were placed in this encounter.   The patient has a good understanding of the overall plan. she agrees with it. she will call with any problems that may develop before the next visit here.   Rulon Eisenmenger, MD 10/30/2015

## 2015-10-30 NOTE — Telephone Encounter (Signed)
Gave patient avs report and appointments for December and january

## 2015-10-30 NOTE — Patient Instructions (Signed)
[facility] Discharge Instructions for Patients Receiving Chemotherapy  Today you received the following chemotherapy agents; Herceptin, Perjeta, Taxotere and Carboplatin.   To help prevent nausea and vomiting after your treatment, we encourage you to take your nausea medication as directed.    If you develop nausea and vomiting that is not controlled by your nausea medication, call the clinic.   BELOW ARE SYMPTOMS THAT SHOULD BE REPORTED IMMEDIATELY:  *FEVER GREATER THAN 100.5 F  *CHILLS WITH OR WITHOUT FEVER  NAUSEA AND VOMITING THAT IS NOT CONTROLLED WITH YOUR NAUSEA MEDICATION  *UNUSUAL SHORTNESS OF BREATH  *UNUSUAL BRUISING OR BLEEDING  TENDERNESS IN MOUTH AND THROAT WITH OR WITHOUT PRESENCE OF ULCERS  *URINARY PROBLEMS  *BOWEL PROBLEMS  UNUSUAL RASH Items with * indicate a potential emergency and should be followed up as soon as possible.  Feel free to call the clinic you have any questions or concerns. The clinic phone number is [phone].  Please show the CHEMO ALERT CARD at check-in to the Emergency Department and triage nurse.   

## 2015-10-30 NOTE — Assessment & Plan Note (Signed)
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 Metastatic disease), right upper lobe 4 mm lung nodule indeterminate  Recommendation: Genetic counseling  1. Neo-adjuvant chemotherapy with TCH Perjeta  (After neoadjuvant chemotherapy we will obtain breast MRI as well as repeat CT chest to evaluate the lung nodule and mediastinal mass)  2. Followed by surgery with most likely mastectomy  3. Followed by Antiestrogen therapy with tamoxifen  -----------------------------------------------------------------------------------------------------------------------  Current Treatment: Today is cycle 3 day 1  ECHO 09/12/15: EF 55-60%  Rt Axill LN Biopsy: Positive  Chemo Toxicities: 1. Nausea grade 1 patient received Aloxi Emend and dexamethasone with chemotherapy. 2. Diarrhea 1 day 3. Severe bone pain related to Neulasta: D/C ed neulasta with cycle 2.  Return to clinic in 3 weeks for blood count check and for cycle 4.

## 2015-11-01 ENCOUNTER — Other Ambulatory Visit: Payer: Self-pay

## 2015-11-01 DIAGNOSIS — C50411 Malignant neoplasm of upper-outer quadrant of right female breast: Secondary | ICD-10-CM

## 2015-11-20 ENCOUNTER — Ambulatory Visit (HOSPITAL_BASED_OUTPATIENT_CLINIC_OR_DEPARTMENT_OTHER): Payer: BLUE CROSS/BLUE SHIELD | Admitting: Hematology and Oncology

## 2015-11-20 ENCOUNTER — Other Ambulatory Visit (HOSPITAL_BASED_OUTPATIENT_CLINIC_OR_DEPARTMENT_OTHER): Payer: BLUE CROSS/BLUE SHIELD

## 2015-11-20 ENCOUNTER — Encounter: Payer: Self-pay | Admitting: Hematology and Oncology

## 2015-11-20 ENCOUNTER — Ambulatory Visit (HOSPITAL_BASED_OUTPATIENT_CLINIC_OR_DEPARTMENT_OTHER): Payer: BLUE CROSS/BLUE SHIELD

## 2015-11-20 ENCOUNTER — Encounter: Payer: Self-pay | Admitting: *Deleted

## 2015-11-20 VITALS — BP 110/84 | HR 80 | Temp 97.7°F | Resp 18 | Ht 63.0 in | Wt 169.7 lb

## 2015-11-20 DIAGNOSIS — C50411 Malignant neoplasm of upper-outer quadrant of right female breast: Secondary | ICD-10-CM

## 2015-11-20 DIAGNOSIS — T451X5A Adverse effect of antineoplastic and immunosuppressive drugs, initial encounter: Secondary | ICD-10-CM

## 2015-11-20 DIAGNOSIS — D701 Agranulocytosis secondary to cancer chemotherapy: Secondary | ICD-10-CM

## 2015-11-20 DIAGNOSIS — Z170 Estrogen receptor positive status [ER+]: Secondary | ICD-10-CM

## 2015-11-20 DIAGNOSIS — Z5111 Encounter for antineoplastic chemotherapy: Secondary | ICD-10-CM

## 2015-11-20 LAB — COMPREHENSIVE METABOLIC PANEL
ALK PHOS: 60 U/L (ref 40–150)
ALT: 16 U/L (ref 0–55)
AST: 16 U/L (ref 5–34)
Albumin: 3.9 g/dL (ref 3.5–5.0)
Anion Gap: 9 mEq/L (ref 3–11)
BUN: 14.4 mg/dL (ref 7.0–26.0)
CHLORIDE: 106 meq/L (ref 98–109)
CO2: 25 meq/L (ref 22–29)
Calcium: 9.4 mg/dL (ref 8.4–10.4)
Creatinine: 0.9 mg/dL (ref 0.6–1.1)
EGFR: >90 mL/min/{1.73_m2} (ref >90)
GLUCOSE: 100 mg/dL (ref 70–140)
POTASSIUM: 4.1 meq/L (ref 3.5–5.1)
Sodium: 140 mEq/L (ref 136–145)
TOTAL PROTEIN: 7.2 g/dL (ref 6.4–8.3)
Total Bilirubin: 0.57 mg/dL (ref 0.20–1.20)

## 2015-11-20 LAB — CBC WITH DIFFERENTIAL/PLATELET
BASO%: 0.3 % (ref 0.0–2.0)
Basophils Absolute: 0 10*3/uL (ref 0.0–0.1)
EOS%: 0.7 % (ref 0.0–7.0)
Eosinophils Absolute: 0 10*3/uL (ref 0.0–0.5)
HEMATOCRIT: 35.9 % (ref 34.8–46.6)
HEMOGLOBIN: 11.9 g/dL (ref 11.6–15.9)
LYMPH#: 2.9 10*3/uL (ref 0.9–3.3)
LYMPH%: 38.4 % (ref 14.0–49.7)
MCH: 28.8 pg (ref 25.1–34.0)
MCHC: 33.2 g/dL (ref 31.5–36.0)
MCV: 86.5 fL (ref 79.5–101.0)
MONO#: 0.6 10*3/uL (ref 0.1–0.9)
MONO%: 7.8 % (ref 0.0–14.0)
NEUT%: 52.8 % (ref 38.4–76.8)
NEUTROS ABS: 4 10*3/uL (ref 1.5–6.5)
PLATELETS: 195 10*3/uL (ref 145–400)
RBC: 4.14 10*6/uL (ref 3.70–5.45)
RDW: 15.1 % — AB (ref 11.2–14.5)
WBC: 7.5 10*3/uL (ref 3.9–10.3)

## 2015-11-20 MED ORDER — DIPHENHYDRAMINE HCL 25 MG PO CAPS
50.0000 mg | ORAL_CAPSULE | Freq: Once | ORAL | Status: AC
  Administered 2015-11-20: 50 mg via ORAL

## 2015-11-20 MED ORDER — SODIUM CHLORIDE 0.9 % IV SOLN
Freq: Once | INTRAVENOUS | Status: AC
  Administered 2015-11-20: 10:00:00 via INTRAVENOUS
  Filled 2015-11-20: qty 5

## 2015-11-20 MED ORDER — PALONOSETRON HCL INJECTION 0.25 MG/5ML
INTRAVENOUS | Status: AC
  Filled 2015-11-20: qty 5

## 2015-11-20 MED ORDER — HEPARIN SOD (PORK) LOCK FLUSH 100 UNIT/ML IV SOLN
500.0000 [IU] | Freq: Once | INTRAVENOUS | Status: AC | PRN
  Administered 2015-11-20: 500 [IU]
  Filled 2015-11-20: qty 5

## 2015-11-20 MED ORDER — SODIUM CHLORIDE 0.9 % IV SOLN
420.0000 mg | Freq: Once | INTRAVENOUS | Status: AC
  Administered 2015-11-20: 420 mg via INTRAVENOUS
  Filled 2015-11-20: qty 14

## 2015-11-20 MED ORDER — PALONOSETRON HCL INJECTION 0.25 MG/5ML
0.2500 mg | Freq: Once | INTRAVENOUS | Status: AC
  Administered 2015-11-20: 0.25 mg via INTRAVENOUS

## 2015-11-20 MED ORDER — TRASTUZUMAB CHEMO INJECTION 440 MG
6.0000 mg/kg | Freq: Once | INTRAVENOUS | Status: AC
  Administered 2015-11-20: 462 mg via INTRAVENOUS
  Filled 2015-11-20: qty 22

## 2015-11-20 MED ORDER — DIPHENHYDRAMINE HCL 25 MG PO CAPS
ORAL_CAPSULE | ORAL | Status: AC
  Filled 2015-11-20: qty 2

## 2015-11-20 MED ORDER — SODIUM CHLORIDE 0.9 % IJ SOLN
10.0000 mL | INTRAMUSCULAR | Status: DC | PRN
  Administered 2015-11-20: 10 mL
  Filled 2015-11-20: qty 10

## 2015-11-20 MED ORDER — SODIUM CHLORIDE 0.9 % IV SOLN
750.0000 mg | Freq: Once | INTRAVENOUS | Status: AC
  Administered 2015-11-20: 750 mg via INTRAVENOUS
  Filled 2015-11-20: qty 75

## 2015-11-20 MED ORDER — SODIUM CHLORIDE 0.9 % IV SOLN
Freq: Once | INTRAVENOUS | Status: AC
  Administered 2015-11-20: 10:00:00 via INTRAVENOUS

## 2015-11-20 MED ORDER — ACETAMINOPHEN 325 MG PO TABS
650.0000 mg | ORAL_TABLET | Freq: Once | ORAL | Status: AC
  Administered 2015-11-20: 650 mg via ORAL

## 2015-11-20 MED ORDER — LORAZEPAM 0.5 MG PO TABS: 0.5000 mg | ORAL_TABLET | Freq: Two times a day (BID) | ORAL | Status: DC | PRN

## 2015-11-20 MED ORDER — ACETAMINOPHEN 325 MG PO TABS
ORAL_TABLET | ORAL | Status: AC
  Filled 2015-11-20: qty 2

## 2015-11-20 MED ORDER — DOCETAXEL CHEMO INJECTION 160 MG/16ML
75.0000 mg/m2 | Freq: Once | INTRAVENOUS | Status: AC
  Administered 2015-11-20: 140 mg via INTRAVENOUS
  Filled 2015-11-20: qty 14

## 2015-11-20 NOTE — Progress Notes (Signed)
Patient Care Team: [name] Patient as PCP - General (General Practice)  DIAGNOSIS: No matching staging information was found for the patient.  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 Today is cycle 4 of [place]. She reports that she tolerated cycle 3 extremely well. She did not have any diarrhea or nausea or vomiting. She did feel moderately fatigued but has recovered very well.  REVIEW OF SYSTEMS:   Constitutional: Denies fevers, chills or abnormal weight loss Eyes: Denies blurriness of vision Ears, nose, mouth, throat, and face: Denies mucositis or sore throat Respiratory: Denies cough, dyspnea or wheezes Cardiovascular: Denies palpitation, chest discomfort or lower extremity swelling Gastrointestinal:  Denies nausea, heartburn or change in bowel habits Skin: Denies abnormal skin rashes Lymphatics: Denies new lymphadenopathy or easy bruising Neurological:Denies numbness, tingling or new weaknesses Behavioral/Psych: Mood is stable, no new changes  All other systems were reviewed with the patient and are  negative.  I have reviewed the past medical history, past surgical history, social history and family history with the patient and they are unchanged from previous note.  ALLERGIES:  has No Known Allergies.  MEDICATIONS:  Current Outpatient Prescriptions  Medication Sig Dispense Refill  . clindamycin (CLINDAGEL) 1 % gel Apply topically 2 (two) times daily. 30 g 0  . dexamethasone (DECADRON) 4 MG tablet Take 1 tablet (4 mg total) by mouth 2 (two) times daily. Start the day before Taxotere. Then again the day after chemo for 3 days. 30 tablet 1  . lidocaine-prilocaine (EMLA) cream Apply to affected area once 30 g 3  . LORazepam (ATIVAN) 0.5 MG tablet Take 1 tablet (0.5 mg total) by mouth at bedtime. (Patient taking differently: Take 0.5 mg by mouth 3 times/day as needed-between meals & bedtime for anxiety or sleep. ) 30 tablet 0  . ondansetron (ZOFRAN) 8 MG tablet Take 1 tablet (8 mg total) by mouth 2 (two) times daily. Start the day after chemo for 3 days. Then take as needed for nausea or vomiting. 30 tablet 1  . oxyCODONE-acetaminophen (PERCOCET/ROXICET) 5-325 MG tablet Take 1 tablet by mouth every 4 (four) hours as needed for severe pain. 30 tablet 0  . prochlorperazine (COMPAZINE) 10 MG tablet Take 1 tablet (10 mg total) by mouth every 6 (six) hours as needed (Nausea or vomiting). 30 tablet 1   No current facility-administered medications for this visit.    PHYSICAL EXAMINATION: ECOG PERFORMANCE STATUS: 1 - Symptomatic but completely ambulatory  Filed Vitals:   11/20/15 0916  BP: 110/84  Pulse: 80  Temp: 97.7 F (36.5 C)  Resp: 18   Filed Weights   11/20/15 0916  Weight: 169 lb 11.2  oz (76.975 kg)    GENERAL:alert, no distress and comfortable SKIN: skin color, texture, turgor are normal, no rashes or significant lesions EYES: normal, Conjunctiva are pink and non-injected, sclera clear OROPHARYNX:no exudate, no erythema and lips, buccal mucosa, and tongue normal  NECK:  supple, thyroid normal size, non-tender, without nodularity LYMPH:  no palpable lymphadenopathy in the cervical, axillary or inguinal LUNGS: clear to auscultation and percussion with normal breathing effort HEART: regular rate & rhythm and no murmurs and no lower extremity edema ABDOMEN:abdomen soft, non-tender and normal bowel sounds Musculoskeletal:no cyanosis of digits and no clubbing  NEURO: alert & oriented x 3 with fluent speech, no focal motor/sensory deficits  LABORATORY DATA:  I have reviewed the data as listed   Chemistry      Component Value Date/Time   NA 141 10/30/2015 0815   NA 136 09/17/2015 0716   K 3.9 10/30/2015 0815   K 3.7 09/17/2015 0716   CL 102 09/17/2015 0716   CO2 23 10/30/2015 0815   CO2 27 09/17/2015 0716   BUN 11.3 10/30/2015 0815   BUN 9 09/17/2015 0716   CREATININE 0.8 10/30/2015 0815   CREATININE 0.79 09/17/2015 0716      Component Value Date/Time   CALCIUM 9.7 10/30/2015 0815   CALCIUM 8.8* 09/17/2015 0716   ALKPHOS 61 10/30/2015 0815   AST 16 10/30/2015 0815   ALT 30 10/30/2015 0815   BILITOT 0.63 10/30/2015 0815       Lab Results  Component Value Date   WBC 7.5 11/20/2015   HGB 11.9 11/20/2015   HCT 35.9 11/20/2015   MCV 86.5 11/20/2015   PLT 195 11/20/2015   NEUTROABS 4.0 11/20/2015    ASSESSMENT & PLAN:  Breast cancer of upper-outer quadrant of right female breast ([place]) 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%, PR 0%, 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 Metastatic disease), right upper lobe 4 mm lung nodule indeterminate  Recommendation: Genetic counseling  1. Neo-adjuvant chemotherapy with TCH Perjeta  (After neoadjuvant chemotherapy we will obtain breast MRI as well as repeat CT chest to evaluate the lung nodule and mediastinal mass)  2. Followed by surgery with most likely mastectomy  3. Followed by Antiestrogen therapy with tamoxifen  -----------------------------------------------------------------------------------------------------------------------  Current Treatment: Today is cycle 4 day 1  ECHO 09/12/15: EF 55-60%  Rt Axill LN Biopsy: Positive   Chemo Toxicities: 1. Nausea grade 1 patient received Aloxi Emend and dexamethasone with chemotherapy. 2. Diarrhea 1 day 3. Severe bone pain related to Neulasta: D/C ed neulasta with cycle 2. Monitoring closely for chemotherapy toxicities Intermittent migraine headaches  Return to clinic in 3 weeks for blood count check and for cycle 5.   No orders of the defined types were placed in this encounter.   The patient has a good understanding of the overall plan. she agrees with it. she will call with any problems that may develop before the next visit here.  Rulon Eisenmenger, MD 11/20/2015

## 2015-11-20 NOTE — Assessment & Plan Note (Signed)
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 Metastatic disease), right upper lobe 4 mm lung nodule indeterminate  Recommendation: Genetic counseling  1. Neo-adjuvant chemotherapy with TCH Perjeta  (After neoadjuvant chemotherapy we will obtain breast MRI as well as repeat CT chest to evaluate the lung nodule and mediastinal mass)  2. Followed by surgery with most likely mastectomy  3. Followed by Antiestrogen therapy with tamoxifen  -----------------------------------------------------------------------------------------------------------------------  Current Treatment: Today is cycle 4 day 1  ECHO 09/12/15: EF 55-60%  Rt Axill LN Biopsy: Positive   Chemo Toxicities: 1. Nausea grade 1 patient received Aloxi Emend and dexamethasone with chemotherapy. 2. Diarrhea 1 day 3. Severe bone pain related to Neulasta: D/C ed neulasta with cycle 2. Monitoring closely for chemotherapy toxicities Intermittent migraine headaches  Return to clinic in 3 weeks for blood count check and for cycle 5. 

## 2015-11-20 NOTE — Patient Instructions (Signed)
[facility] Discharge Instructions for Patients Receiving Chemotherapy  Today you received the following chemotherapy agents:  Herceptin, Perjeta, Taxotere and Carboplatin  To help prevent nausea and vomiting after your treatment, we encourage you to take your nausea medication as ordered per MD.   If you develop nausea and vomiting that is not controlled by your nausea medication, call the clinic.   BELOW ARE SYMPTOMS THAT SHOULD BE REPORTED IMMEDIATELY:  *FEVER GREATER THAN 100.5 F  *CHILLS WITH OR WITHOUT FEVER  NAUSEA AND VOMITING THAT IS NOT CONTROLLED WITH YOUR NAUSEA MEDICATION  *UNUSUAL SHORTNESS OF BREATH  *UNUSUAL BRUISING OR BLEEDING  TENDERNESS IN MOUTH AND THROAT WITH OR WITHOUT PRESENCE OF ULCERS  *URINARY PROBLEMS  *BOWEL PROBLEMS  UNUSUAL RASH Items with * indicate a potential emergency and should be followed up as soon as possible.  Feel free to call the clinic you have any questions or concerns. The clinic phone number is [phone].  Please show the CHEMO ALERT CARD at check-in to the Emergency Department and triage nurse.   

## 2015-11-21 ENCOUNTER — Telehealth: Payer: Self-pay | Admitting: Hematology and Oncology

## 2015-11-21 NOTE — Telephone Encounter (Signed)
Spoke with patient and she is aware of her appointments °

## 2015-12-02 HISTORY — PX: MASTECTOMY: SHX3

## 2015-12-05 ENCOUNTER — Other Ambulatory Visit: Payer: Self-pay

## 2015-12-05 DIAGNOSIS — C50411 Malignant neoplasm of upper-outer quadrant of right female breast: Secondary | ICD-10-CM

## 2015-12-07 ENCOUNTER — Other Ambulatory Visit: Payer: Self-pay | Admitting: Hematology and Oncology

## 2015-12-07 ENCOUNTER — Telehealth: Payer: Self-pay | Admitting: Hematology and Oncology

## 2015-12-07 DIAGNOSIS — Z5111 Encounter for antineoplastic chemotherapy: Secondary | ICD-10-CM

## 2015-12-07 NOTE — Telephone Encounter (Signed)
Called and left a message with echo appt

## 2015-12-11 ENCOUNTER — Other Ambulatory Visit: Payer: Self-pay

## 2015-12-11 ENCOUNTER — Other Ambulatory Visit (HOSPITAL_BASED_OUTPATIENT_CLINIC_OR_DEPARTMENT_OTHER): Payer: BLUE CROSS/BLUE SHIELD

## 2015-12-11 ENCOUNTER — Ambulatory Visit (HOSPITAL_BASED_OUTPATIENT_CLINIC_OR_DEPARTMENT_OTHER): Payer: BLUE CROSS/BLUE SHIELD

## 2015-12-11 ENCOUNTER — Encounter: Payer: Self-pay | Admitting: *Deleted

## 2015-12-11 ENCOUNTER — Encounter: Payer: Self-pay | Admitting: Hematology and Oncology

## 2015-12-11 ENCOUNTER — Ambulatory Visit (HOSPITAL_BASED_OUTPATIENT_CLINIC_OR_DEPARTMENT_OTHER): Payer: BLUE CROSS/BLUE SHIELD | Admitting: Hematology and Oncology

## 2015-12-11 ENCOUNTER — Ambulatory Visit: Payer: BLUE CROSS/BLUE SHIELD

## 2015-12-11 ENCOUNTER — Telehealth: Payer: Self-pay | Admitting: Hematology and Oncology

## 2015-12-11 VITALS — BP 116/77 | HR 81 | Temp 97.9°F | Resp 18 | Wt 169.2 lb

## 2015-12-11 DIAGNOSIS — Z5112 Encounter for antineoplastic immunotherapy: Secondary | ICD-10-CM

## 2015-12-11 DIAGNOSIS — Z5111 Encounter for antineoplastic chemotherapy: Secondary | ICD-10-CM | POA: Diagnosis not present

## 2015-12-11 DIAGNOSIS — C50411 Malignant neoplasm of upper-outer quadrant of right female breast: Secondary | ICD-10-CM

## 2015-12-11 DIAGNOSIS — D701 Agranulocytosis secondary to cancer chemotherapy: Secondary | ICD-10-CM

## 2015-12-11 DIAGNOSIS — T451X5A Adverse effect of antineoplastic and immunosuppressive drugs, initial encounter: Secondary | ICD-10-CM

## 2015-12-11 LAB — CBC WITH DIFFERENTIAL/PLATELET
BASO%: 0.4 % (ref 0.0–2.0)
Basophils Absolute: 0 10*3/uL (ref 0.0–0.1)
EOS ABS: 0.1 10*3/uL (ref 0.0–0.5)
EOS%: 1.4 % (ref 0.0–7.0)
HCT: 32.5 % — ABNORMAL LOW (ref 34.8–46.6)
HGB: 11.3 g/dL — ABNORMAL LOW (ref 11.6–15.9)
LYMPH%: 43.4 % (ref 14.0–49.7)
MCH: 30.3 pg (ref 25.1–34.0)
MCHC: 34.8 g/dL (ref 31.5–36.0)
MCV: 87.1 fL (ref 79.5–101.0)
MONO#: 0.4 10*3/uL (ref 0.1–0.9)
MONO%: 7.7 % (ref 0.0–14.0)
NEUT%: 47.1 % (ref 38.4–76.8)
NEUTROS ABS: 2.3 10*3/uL (ref 1.5–6.5)
Platelets: 164 10*3/uL (ref 145–400)
RBC: 3.73 10*6/uL (ref 3.70–5.45)
RDW: 16.2 % — AB (ref 11.2–14.5)
WBC: 5 10*3/uL (ref 3.9–10.3)
lymph#: 2.2 10*3/uL (ref 0.9–3.3)

## 2015-12-11 LAB — COMPREHENSIVE METABOLIC PANEL
ALT: 18 U/L (ref 0–55)
AST: 16 U/L (ref 5–34)
Albumin: 3.7 g/dL (ref 3.5–5.0)
Alkaline Phosphatase: 59 U/L (ref 40–150)
Anion Gap: 9 mEq/L (ref 3–11)
BILIRUBIN TOTAL: 0.55 mg/dL (ref 0.20–1.20)
BUN: 11.4 mg/dL (ref 7.0–26.0)
CO2: 23 meq/L (ref 22–29)
Calcium: 9.2 mg/dL (ref 8.4–10.4)
Chloride: 108 mEq/L (ref 98–109)
Creatinine: 0.9 mg/dL (ref 0.6–1.1)
GLUCOSE: 110 mg/dL (ref 70–140)
POTASSIUM: 4 meq/L (ref 3.5–5.1)
SODIUM: 140 meq/L (ref 136–145)
TOTAL PROTEIN: 6.7 g/dL (ref 6.4–8.3)

## 2015-12-11 MED ORDER — DIPHENHYDRAMINE HCL 25 MG PO CAPS
50.0000 mg | ORAL_CAPSULE | Freq: Once | ORAL | Status: AC
Start: 2015-12-11 — End: 2015-12-11
  Administered 2015-12-11: 50 mg via ORAL

## 2015-12-11 MED ORDER — FOSAPREPITANT DIMEGLUMINE INJECTION 150 MG
Freq: Once | INTRAVENOUS | Status: AC
  Administered 2015-12-11: 10:00:00 via INTRAVENOUS
  Filled 2015-12-11: qty 5

## 2015-12-11 MED ORDER — SODIUM CHLORIDE 0.9 % IV SOLN
750.0000 mg | Freq: Once | INTRAVENOUS | Status: AC
  Administered 2015-12-11: 750 mg via INTRAVENOUS
  Filled 2015-12-11: qty 75

## 2015-12-11 MED ORDER — ACETAMINOPHEN 325 MG PO TABS
650.0000 mg | ORAL_TABLET | Freq: Once | ORAL | Status: AC
  Administered 2015-12-11: 650 mg via ORAL

## 2015-12-11 MED ORDER — SODIUM CHLORIDE 0.9 % IJ SOLN
10.0000 mL | INTRAMUSCULAR | Status: DC | PRN
  Administered 2015-12-11: 10 mL
  Filled 2015-12-11: qty 10

## 2015-12-11 MED ORDER — PALONOSETRON HCL INJECTION 0.25 MG/5ML
INTRAVENOUS | Status: AC
  Filled 2015-12-11: qty 5

## 2015-12-11 MED ORDER — HEPARIN SOD (PORK) LOCK FLUSH 100 UNIT/ML IV SOLN
500.0000 [IU] | Freq: Once | INTRAVENOUS | Status: AC | PRN
  Administered 2015-12-11: 500 [IU]
  Filled 2015-12-11: qty 5

## 2015-12-11 MED ORDER — DIPHENHYDRAMINE HCL 25 MG PO CAPS
ORAL_CAPSULE | ORAL | Status: AC
  Filled 2015-12-11: qty 2

## 2015-12-11 MED ORDER — DOCETAXEL CHEMO INJECTION 160 MG/16ML
75.0000 mg/m2 | Freq: Once | INTRAVENOUS | Status: AC
  Administered 2015-12-11: 140 mg via INTRAVENOUS
  Filled 2015-12-11: qty 14

## 2015-12-11 MED ORDER — SODIUM CHLORIDE 0.9 % IV SOLN
Freq: Once | INTRAVENOUS | Status: DC
Start: 2015-12-11 — End: 2015-12-11

## 2015-12-11 MED ORDER — ACETAMINOPHEN 325 MG PO TABS
ORAL_TABLET | ORAL | Status: AC
  Filled 2015-12-11: qty 2

## 2015-12-11 MED ORDER — SODIUM CHLORIDE 0.9 % IV SOLN
420.0000 mg | Freq: Once | INTRAVENOUS | Status: AC
  Administered 2015-12-11: 420 mg via INTRAVENOUS
  Filled 2015-12-11: qty 14

## 2015-12-11 MED ORDER — PALONOSETRON HCL INJECTION 0.25 MG/5ML
0.2500 mg | Freq: Once | INTRAVENOUS | Status: AC
  Administered 2015-12-11: 0.25 mg via INTRAVENOUS

## 2015-12-11 MED ORDER — SODIUM CHLORIDE 0.9 % IV SOLN
Freq: Once | INTRAVENOUS | Status: AC
  Administered 2015-12-11: 10:00:00 via INTRAVENOUS

## 2015-12-11 MED ORDER — TRASTUZUMAB CHEMO INJECTION 440 MG
6.0000 mg/kg | Freq: Once | INTRAVENOUS | Status: AC
  Administered 2015-12-11: 462 mg via INTRAVENOUS
  Filled 2015-12-11: qty 22

## 2015-12-11 NOTE — Addendum Note (Signed)
Addended by: Prentiss Bells on: 12/11/2015 09:43 AM   Modules accepted: Medications

## 2015-12-11 NOTE — Assessment & Plan Note (Signed)
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 Metastatic disease), right upper lobe 4 mm lung nodule indeterminate  Recommendation: Genetic counseling  1. Neo-adjuvant chemotherapy with TCH Perjeta  (After neoadjuvant chemotherapy we will obtain breast MRI as well as repeat CT chest to evaluate the lung nodule and mediastinal mass)  2. Followed by surgery with most likely mastectomy  3. Followed by Antiestrogen therapy with tamoxifen  -----------------------------------------------------------------------------------------------------------------------  Current Treatment: Today is cycle 5 day 1  ECHO 09/12/15: EF 55-60%  Rt Axill LN Biopsy: Positive   Chemo Toxicities: 1. Nausea grade 1 patient received Aloxi Emend and dexamethasone with chemotherapy. 2. Diarrhea 1 day 3. Severe bone pain related to Neulasta: D/C ed neulasta with cycle 2. Monitoring closely for chemotherapy toxicities Intermittent migraine headaches  Return to clinic in 3 weeks for blood count check and for cycle 6. We will order the MRI to be done after cycle 6 I will request a follow-up with her surgeon.

## 2015-12-11 NOTE — Telephone Encounter (Signed)
Appointments made and avs printed for patient and she will call [facility] for her mri

## 2015-12-11 NOTE — Patient Instructions (Signed)
[facility] Discharge Instructions for Patients Receiving Chemotherapy  Today you received the following chemotherapy agents Herceptin/Perjeta/Taxotere/Carboplatin.  To help prevent nausea and vomiting after your treatment, we encourage you to take your nausea medication as directed.   If you develop nausea and vomiting that is not controlled by your nausea medication, call the clinic.   BELOW ARE SYMPTOMS THAT SHOULD BE REPORTED IMMEDIATELY:  *FEVER GREATER THAN 100.5 F  *CHILLS WITH OR WITHOUT FEVER  NAUSEA AND VOMITING THAT IS NOT CONTROLLED WITH YOUR NAUSEA MEDICATION  *UNUSUAL SHORTNESS OF BREATH  *UNUSUAL BRUISING OR BLEEDING  TENDERNESS IN MOUTH AND THROAT WITH OR WITHOUT PRESENCE OF ULCERS  *URINARY PROBLEMS  *BOWEL PROBLEMS  UNUSUAL RASH Items with * indicate a potential emergency and should be followed up as soon as possible.  Feel free to call the clinic you have any questions or concerns. The clinic phone number is [phone].  Please show the CHEMO ALERT CARD at check-in to the Emergency Department and triage nurse.    

## 2015-12-11 NOTE — Progress Notes (Signed)
Patient Care Team: [name] Patient as PCP - General (General Practice)  DIAGNOSIS: No matching staging information was found for the patient.  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 Today is cycle #5. She has been tolerating chemotherapy extremely well with very minimal side effects. She does not complain of any fevers or chills denies any nausea or vomiting. Denies any neuropathy. We discontinue Neulasta on her blood counts have been remaining fairly stable. She no longer has a bone pain that she developed with Neulasta.  REVIEW OF SYSTEMS:   Constitutional: Denies fevers, chills or abnormal weight loss Eyes: Denies blurriness of vision Ears, nose, mouth, throat, and face: Denies mucositis or sore throat Respiratory: Denies cough, dyspnea or wheezes Cardiovascular: Denies palpitation, chest discomfort Gastrointestinal:  Denies nausea, heartburn or change in bowel habits Skin: Denies abnormal skin rashes Lymphatics: Denies new lymphadenopathy or easy bruising Neurological:Denies numbness,  tingling or new weaknesses Behavioral/Psych: Mood is stable, no new changes  Extremities: No lower extremity edema Breast: Improvement in the breast lump All other systems were reviewed with the patient and are negative.  I have reviewed the past medical history, past surgical history, social history and family history with the patient and they are unchanged from previous note.  ALLERGIES:  has No Known Allergies.  MEDICATIONS:  Current Outpatient Prescriptions  Medication Sig Dispense Refill  . clindamycin (CLINDAGEL) 1 % gel Apply topically 2 (two) times daily. 30 g 0  . dexamethasone (DECADRON) 4 MG tablet Take 1 tablet (4 mg total) by mouth 2 (two) times daily. Start the day before Taxotere. Then again the day after chemo for 3 days. 30 tablet 1  . lidocaine-prilocaine (EMLA) cream Apply to affected area once 30 g 3  . LORazepam (ATIVAN) 0.5 MG tablet Take 1 tablet (0.5 mg total) by mouth 3 times/day as needed-between meals & bedtime for anxiety or sleep. 30 tablet 0  . ondansetron (ZOFRAN) 8 MG tablet Take 1 tablet (8 mg total) by mouth 2 (two) times daily. Start the day after chemo for 3 days. Then take as needed for nausea or vomiting. 30 tablet 1  . oxyCODONE-acetaminophen (PERCOCET/ROXICET) 5-325 MG tablet Take 1 tablet by mouth every 4 (four) hours as needed for severe pain. 30 tablet 0  . prochlorperazine (COMPAZINE) 10 MG tablet Take 1 tablet (10 mg total) by mouth every 6 (six) hours as needed (Nausea or vomiting). 30 tablet 1   No current facility-administered medications for this visit.    PHYSICAL EXAMINATION: ECOG PERFORMANCE STATUS: 1 - Symptomatic but completely ambulatory  There were no vitals filed for this visit. There were no vitals filed for this visit.  GENERAL:alert, no distress and comfortable SKIN: skin color, texture, turgor are normal, no rashes or significant lesions EYES: normal, Conjunctiva are pink and non-injected, sclera clear OROPHARYNX:no  exudate, no erythema and lips, buccal mucosa, and tongue normal  NECK: supple, thyroid normal size, non-tender, without nodularity LYMPH:  no palpable lymphadenopathy in the cervical, axillary or inguinal LUNGS: clear to auscultation and percussion with normal breathing effort HEART: regular rate & rhythm and no murmurs and no lower extremity edema ABDOMEN:abdomen soft, non-tender and normal bowel sounds MUSCULOSKELETAL:no cyanosis of digits and no clubbing  NEURO: alert & oriented x 3 with fluent speech, no focal motor/sensory deficits EXTREMITIES: No lower extremity edema  LABORATORY DATA:  I have reviewed the data as listed   Chemistry      Component Value Date/Time   NA 140 11/20/2015 0840   NA 136 09/17/2015 0716   K 4.1 11/20/2015 0840   K 3.7 09/17/2015 0716   CL 102 09/17/2015 0716   CO2 25 11/20/2015 0840   CO2 27 09/17/2015 0716   BUN 14.4 11/20/2015 0840   BUN 9 09/17/2015 0716   CREATININE 0.9 11/20/2015 0840   CREATININE 0.79 09/17/2015 0716      Component Value Date/Time   CALCIUM 9.4 11/20/2015 0840   CALCIUM 8.8* 09/17/2015 0716   ALKPHOS 60 11/20/2015 0840   AST 16 11/20/2015 0840   ALT 16 11/20/2015 0840   BILITOT 0.57 11/20/2015 0840       Lab Results  Component Value Date   WBC 7.5 11/20/2015   HGB 11.9 11/20/2015   HCT 35.9 11/20/2015   MCV 86.5 11/20/2015   PLT 195 11/20/2015   NEUTROABS 4.0 11/20/2015     ASSESSMENT & PLAN:  Breast cancer of upper-outer quadrant of right female breast ([place]) 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%, PR 0%, 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 Metastatic disease), right upper lobe 4 mm lung nodule indeterminate  Recommendation: Genetic counseling  1. Neo-adjuvant chemotherapy with TCH Perjeta  (After neoadjuvant chemotherapy we will obtain breast MRI as well as repeat CT chest to evaluate the lung nodule and mediastinal mass)  2. Followed by surgery with most likely mastectomy  3. Followed by Antiestrogen therapy with tamoxifen  -----------------------------------------------------------------------------------------------------------------------  Current Treatment: Today is cycle 5 day 1  ECHO 09/12/15: EF 55-60%  Next echocardiogram scheduled for 12/17/2015 Rt Axill LN Biopsy: Positive   Chemo Toxicities: 1. Nausea grade 1 patient received Aloxi Emend and dexamethasone with chemotherapy. 2. Diarrhea 1 day 3. Severe bone pain related to Neulasta: D/C ed neulasta with cycle 2. Monitoring closely for chemotherapy toxicities Intermittent migraine headaches  Return to clinic in 3 weeks for blood count check and for cycle 6. We will order the MRI to be done after cycle 6 I will request a follow-up with her surgeon.  No orders of the defined types were placed in this encounter.   The patient has a good understanding of  the overall plan. she agrees with it. she will call with any problems that may develop before the next visit here.   Rulon Eisenmenger, MD 12/11/2015

## 2015-12-17 ENCOUNTER — Ambulatory Visit (HOSPITAL_COMMUNITY): Payer: BLUE CROSS/BLUE SHIELD

## 2015-12-18 ENCOUNTER — Ambulatory Visit (HOSPITAL_COMMUNITY)
Admission: RE | Admit: 2015-12-18 | Discharge: 2015-12-18 | Disposition: A | Discharge status: Home / Self Care | Payer: BLUE CROSS/BLUE SHIELD | Source: Ambulatory Visit | Attending: Hematology and Oncology | Admitting: Hematology and Oncology

## 2015-12-18 DIAGNOSIS — Z5111 Encounter for antineoplastic chemotherapy: Secondary | ICD-10-CM | POA: Diagnosis not present

## 2015-12-18 DIAGNOSIS — C50411 Malignant neoplasm of upper-outer quadrant of right female breast: Secondary | ICD-10-CM | POA: Diagnosis not present

## 2015-12-18 DIAGNOSIS — I340 Nonrheumatic mitral (valve) insufficiency: Secondary | ICD-10-CM | POA: Diagnosis not present

## 2015-12-18 NOTE — Progress Notes (Signed)
  Echocardiogram 2D Echocardiogram has been performed.  Bobbye Charleston 12/18/2015, 11:05 AM

## 2015-12-20 ENCOUNTER — Other Ambulatory Visit: Payer: Self-pay | Admitting: *Deleted

## 2015-12-20 ENCOUNTER — Telehealth: Payer: Self-pay | Admitting: Hematology and Oncology

## 2015-12-20 NOTE — Telephone Encounter (Signed)
Called and left a message with follow up per pof

## 2015-12-25 ENCOUNTER — Telehealth: Payer: Self-pay | Admitting: *Deleted

## 2015-12-25 NOTE — Telephone Encounter (Signed)
Received call from patient stating she feels like she has a cold. She has drainage but states it is clear.  She states she does have a low grade fever of 99.  She is wanting to know what she can take.  Instructed her that she could take Mucinex but to drink lots of water and informed her that temperature goes up or her drainage turns yellow or green to call us back and Dr. Lindi Adie would prescribe antibiotic.

## 2015-12-31 NOTE — Assessment & Plan Note (Signed)
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 Metastatic disease), right upper lobe 4 mm lung nodule indeterminate  Recommendation: Genetic counseling  1. Neo-adjuvant chemotherapy with TCH Perjeta  (After neoadjuvant chemotherapy we will obtain breast MRI as well as repeat CT chest to evaluate the lung nodule and mediastinal mass)  2. Followed by surgery with most likely mastectomy  3. Followed by Antiestrogen therapy with tamoxifen  -----------------------------------------------------------------------------------------------------------------------  Current Treatment: Today is cycle 5 day 1  ECHO 09/12/15: EF 55-60%  Next echocardiogram scheduled for 12/17/2015 Rt Axill LN Biopsy: Positive   Chemo Toxicities: 1. Nausea grade 1 patient received Aloxi Emend and dexamethasone with chemotherapy. 2. Diarrhea 1 day 3. Severe bone pain related to Neulasta: D/C ed neulasta with cycle 2. Monitoring closely for chemotherapy toxicities Intermittent migraine headaches  We will order the MRI to be done after cycle 6 I will request a follow-up with her surgeon.

## 2016-01-01 ENCOUNTER — Encounter: Payer: Self-pay | Admitting: Hematology and Oncology

## 2016-01-01 ENCOUNTER — Encounter: Payer: Self-pay | Admitting: *Deleted

## 2016-01-01 ENCOUNTER — Other Ambulatory Visit (HOSPITAL_BASED_OUTPATIENT_CLINIC_OR_DEPARTMENT_OTHER): Payer: BLUE CROSS/BLUE SHIELD

## 2016-01-01 ENCOUNTER — Ambulatory Visit: Payer: BLUE CROSS/BLUE SHIELD

## 2016-01-01 ENCOUNTER — Ambulatory Visit (HOSPITAL_BASED_OUTPATIENT_CLINIC_OR_DEPARTMENT_OTHER): Payer: BLUE CROSS/BLUE SHIELD

## 2016-01-01 ENCOUNTER — Telehealth: Payer: Self-pay | Admitting: Hematology and Oncology

## 2016-01-01 ENCOUNTER — Ambulatory Visit (HOSPITAL_BASED_OUTPATIENT_CLINIC_OR_DEPARTMENT_OTHER): Payer: BLUE CROSS/BLUE SHIELD | Admitting: Hematology and Oncology

## 2016-01-01 ENCOUNTER — Other Ambulatory Visit: Payer: BLUE CROSS/BLUE SHIELD

## 2016-01-01 VITALS — BP 112/71 | HR 93 | Temp 97.6°F | Resp 18 | Ht 63.0 in | Wt 167.3 lb

## 2016-01-01 DIAGNOSIS — Z5112 Encounter for antineoplastic immunotherapy: Secondary | ICD-10-CM | POA: Diagnosis not present

## 2016-01-01 DIAGNOSIS — C50411 Malignant neoplasm of upper-outer quadrant of right female breast: Secondary | ICD-10-CM

## 2016-01-01 DIAGNOSIS — Z95828 Presence of other vascular implants and grafts: Secondary | ICD-10-CM

## 2016-01-01 DIAGNOSIS — Z5111 Encounter for antineoplastic chemotherapy: Secondary | ICD-10-CM | POA: Diagnosis not present

## 2016-01-01 LAB — CBC WITH DIFFERENTIAL/PLATELET
BASO%: 0.6 % (ref 0.0–2.0)
BASOS ABS: 0 10*3/uL (ref 0.0–0.1)
EOS ABS: 0 10*3/uL (ref 0.0–0.5)
EOS%: 0.3 % (ref 0.0–7.0)
HCT: 32.5 % — ABNORMAL LOW (ref 34.8–46.6)
HGB: 11.1 g/dL — ABNORMAL LOW (ref 11.6–15.9)
LYMPH%: 32.8 % (ref 14.0–49.7)
MCH: 31.4 pg (ref 25.1–34.0)
MCHC: 34.3 g/dL (ref 31.5–36.0)
MCV: 91.5 fL (ref 79.5–101.0)
MONO#: 0.5 10*3/uL (ref 0.1–0.9)
MONO%: 7 % (ref 0.0–14.0)
NEUT#: 4.4 10*3/uL (ref 1.5–6.5)
NEUT%: 59.3 % (ref 38.4–76.8)
PLATELETS: 199 10*3/uL (ref 145–400)
RBC: 3.55 10*6/uL — AB (ref 3.70–5.45)
RDW: 17.3 % — ABNORMAL HIGH (ref 11.2–14.5)
WBC: 7.4 10*3/uL (ref 3.9–10.3)
lymph#: 2.4 10*3/uL (ref 0.9–3.3)

## 2016-01-01 LAB — COMPREHENSIVE METABOLIC PANEL
ALT: 17 U/L (ref 0–55)
AST: 14 U/L (ref 5–34)
Albumin: 3.5 g/dL (ref 3.5–5.0)
Alkaline Phosphatase: 59 U/L (ref 40–150)
Anion Gap: 12 mEq/L — ABNORMAL HIGH (ref 3–11)
BUN: 17.3 mg/dL (ref 7.0–26.0)
CHLORIDE: 108 meq/L (ref 98–109)
CO2: 19 meq/L — AB (ref 22–29)
Calcium: 9.1 mg/dL (ref 8.4–10.4)
Creatinine: 0.8 mg/dL (ref 0.6–1.1)
GLUCOSE: 106 mg/dL (ref 70–140)
POTASSIUM: 3.7 meq/L (ref 3.5–5.1)
SODIUM: 139 meq/L (ref 136–145)
Total Bilirubin: 0.3 mg/dL (ref 0.20–1.20)
Total Protein: 6.8 g/dL (ref 6.4–8.3)

## 2016-01-01 MED ORDER — PALONOSETRON HCL INJECTION 0.25 MG/5ML
0.2500 mg | Freq: Once | INTRAVENOUS | Status: AC
  Administered 2016-01-01: 0.25 mg via INTRAVENOUS

## 2016-01-01 MED ORDER — DIPHENHYDRAMINE HCL 25 MG PO CAPS
50.0000 mg | ORAL_CAPSULE | Freq: Once | ORAL | Status: AC
  Administered 2016-01-01: 50 mg via ORAL

## 2016-01-01 MED ORDER — HEPARIN SOD (PORK) LOCK FLUSH 100 UNIT/ML IV SOLN
500.0000 [IU] | Freq: Once | INTRAVENOUS | Status: AC | PRN
  Administered 2016-01-01: 500 [IU]
  Filled 2016-01-01: qty 5

## 2016-01-01 MED ORDER — SODIUM CHLORIDE 0.9 % IV SOLN
6.0000 mg/kg | Freq: Once | INTRAVENOUS | Status: AC
  Administered 2016-01-01: 462 mg via INTRAVENOUS
  Filled 2016-01-01: qty 22

## 2016-01-01 MED ORDER — SODIUM CHLORIDE 0.9% FLUSH
10.0000 mL | INTRAVENOUS | Status: DC | PRN
  Administered 2016-01-01: 10 mL via INTRAVENOUS
  Filled 2016-01-01: qty 10

## 2016-01-01 MED ORDER — CARBOPLATIN CHEMO INJECTION 600 MG/60ML
750.0000 mg | Freq: Once | INTRAVENOUS | Status: AC
  Administered 2016-01-01: 750 mg via INTRAVENOUS
  Filled 2016-01-01: qty 75

## 2016-01-01 MED ORDER — PALONOSETRON HCL INJECTION 0.25 MG/5ML
INTRAVENOUS | Status: AC
  Filled 2016-01-01: qty 5

## 2016-01-01 MED ORDER — ACETAMINOPHEN 325 MG PO TABS
ORAL_TABLET | ORAL | Status: AC
  Filled 2016-01-01: qty 2

## 2016-01-01 MED ORDER — SODIUM CHLORIDE 0.9 % IV SOLN
Freq: Once | INTRAVENOUS | Status: AC
  Administered 2016-01-01: 11:00:00 via INTRAVENOUS
  Filled 2016-01-01: qty 5

## 2016-01-01 MED ORDER — SODIUM CHLORIDE 0.9 % IV SOLN
Freq: Once | INTRAVENOUS | Status: AC
  Administered 2016-01-01: 09:00:00 via INTRAVENOUS

## 2016-01-01 MED ORDER — PERTUZUMAB CHEMO INJECTION 420 MG/14ML
420.0000 mg | Freq: Once | INTRAVENOUS | Status: AC
  Administered 2016-01-01: 420 mg via INTRAVENOUS
  Filled 2016-01-01: qty 14

## 2016-01-01 MED ORDER — ACETAMINOPHEN 325 MG PO TABS
650.0000 mg | ORAL_TABLET | Freq: Once | ORAL | Status: AC
  Administered 2016-01-01: 650 mg via ORAL

## 2016-01-01 MED ORDER — DOCETAXEL CHEMO INJECTION 160 MG/16ML
75.0000 mg/m2 | Freq: Once | INTRAVENOUS | Status: AC
  Administered 2016-01-01: 140 mg via INTRAVENOUS
  Filled 2016-01-01: qty 14

## 2016-01-01 MED ORDER — SODIUM CHLORIDE 0.9 % IJ SOLN
10.0000 mL | INTRAMUSCULAR | Status: DC | PRN
  Administered 2016-01-01: 10 mL
  Filled 2016-01-01: qty 10

## 2016-01-01 NOTE — Patient Instructions (Signed)

## 2016-01-01 NOTE — Progress Notes (Signed)
Unable to get in to exam room prior to MD.  No assessment performed.  

## 2016-01-01 NOTE — Patient Instructions (Signed)
[facility] Discharge Instructions for Patients Receiving Chemotherapy  Today you received the following chemotherapy agents Herceptin/Perjeta/Taxotere/Carboplatin.  To help prevent nausea and vomiting after your treatment, we encourage you to take your nausea medication as directed.   If you develop nausea and vomiting that is not controlled by your nausea medication, call the clinic.   BELOW ARE SYMPTOMS THAT SHOULD BE REPORTED IMMEDIATELY:  *FEVER GREATER THAN 100.5 F  *CHILLS WITH OR WITHOUT FEVER  NAUSEA AND VOMITING THAT IS NOT CONTROLLED WITH YOUR NAUSEA MEDICATION  *UNUSUAL SHORTNESS OF BREATH  *UNUSUAL BRUISING OR BLEEDING  TENDERNESS IN MOUTH AND THROAT WITH OR WITHOUT PRESENCE OF ULCERS  *URINARY PROBLEMS  *BOWEL PROBLEMS  UNUSUAL RASH Items with * indicate a potential emergency and should be followed up as soon as possible.  Feel free to call the clinic you have any questions or concerns. The clinic phone number is [phone].  Please show the CHEMO ALERT CARD at check-in to the Emergency Department and triage nurse.    

## 2016-01-01 NOTE — Progress Notes (Signed)
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 This will be the last of the chemotherapy treatments. After this she will start Herceptin maintenance. She reports no nausea or vomiting. She had migraine headaches which have improved significantly. She has upper respiratory tract infection with cold and a cough. She denies any fevers or chills. She has appointments for breast MRI and an appointment with her surgeon.  REVIEW OF SYSTEMS:   Constitutional: Denies fevers, chills or abnormal weight loss Eyes: Denies blurriness of vision Ears, nose, mouth, throat, and face: Cough and cold Respiratory: Denies cough, dyspnea or wheezes Cardiovascular: Denies palpitation, chest discomfort Gastrointestinal:  Denies nausea, heartburn or change in bowel habits Skin: Denies abnormal skin rashes Lymphatics: Denies new lymphadenopathy or easy bruising Neurological:Denies numbness, tingling or new weaknesses Behavioral/Psych: Mood is stable, no new changes    Extremities: No lower extremity edema  All other systems were reviewed with the patient and are negative.  I have reviewed the past medical history, past surgical history, social history and family history with the patient and they are unchanged from previous note.  ALLERGIES:  has No Known Allergies.  MEDICATIONS:  Current Outpatient Prescriptions  Medication Sig Dispense Refill  . clindamycin (CLINDAGEL) 1 % gel Apply topically 2 (two) times daily. 30 g 0  . dexamethasone (DECADRON) 4 MG tablet Take 1 tablet (4 mg total) by mouth 2 (two) times daily. Start the day before Taxotere. Then again the day after chemo for 3 days. 30 tablet 1  . lidocaine-prilocaine (EMLA) cream Apply to affected area once 30 g 3  . LORazepam (ATIVAN) 0.5 MG tablet Take 1 tablet (0.5 mg total) by mouth 3 times/day as needed-between meals & bedtime for anxiety or sleep. 30 tablet 0  . ondansetron (ZOFRAN) 8 MG tablet Take 1 tablet (8 mg total) by mouth 2 (two) times daily. Start the day after chemo for 3 days. Then take as needed for nausea or vomiting. 30 tablet 1  . oxyCODONE-acetaminophen (PERCOCET/ROXICET) 5-325 MG tablet Take 1 tablet by mouth every 4 (four) hours as needed for severe pain. 30 tablet 0  . prochlorperazine (COMPAZINE) 10 MG tablet Take 1 tablet (10 mg total) by mouth every 6 (six) hours as needed (Nausea or vomiting). 30 tablet 1   No current facility-administered medications for this visit.    PHYSICAL EXAMINATION: ECOG PERFORMANCE STATUS: 1 - Symptomatic but completely ambulatory  Filed Vitals:   01/01/16 0831  BP: 112/71  Pulse: 93  Temp: 97.6 F (36.4 C)  Resp: 18   Filed Weights   01/01/16 0831  Weight: 167 lb 4.8 oz (75.887 kg)    GENERAL:alert, no distress and comfortable SKIN: skin color, texture, turgor are normal, no rashes or significant lesions EYES: normal, Conjunctiva are pink and non-injected, sclera clear OROPHARYNX:no exudate, no erythema and lips, buccal  mucosa, and tongue normal  NECK: supple, thyroid normal size, non-tender, without nodularity LYMPH:  no palpable lymphadenopathy in the cervical, axillary or inguinal LUNGS: clear to auscultation and percussion with normal breathing effort HEART: regular rate & rhythm and no murmurs and no lower extremity edema ABDOMEN:abdomen soft, non-tender and normal bowel sounds MUSCULOSKELETAL:no cyanosis of digits and no clubbing  NEURO: alert & oriented x 3 with fluent speech, no focal motor/sensory deficits EXTREMITIES: No lower extremity edema  LABORATORY DATA:  I have reviewed the data as listed   Chemistry      Component Value Date/Time   NA 140 12/11/2015 0834   NA 136 09/17/2015 0716   K 4.0 12/11/2015 0834   K 3.7 09/17/2015 0716   CL 102 09/17/2015 0716   CO2 23 12/11/2015 0834   CO2 27 09/17/2015 0716   BUN 11.4 12/11/2015 0834   BUN 9 09/17/2015 0716   CREATININE 0.9 12/11/2015 0834   CREATININE 0.79 09/17/2015 0716      Component Value Date/Time   CALCIUM 9.2 12/11/2015 0834   CALCIUM 8.8* 09/17/2015 0716   ALKPHOS 59 12/11/2015 0834   AST 16 12/11/2015 0834   ALT 18 12/11/2015 0834   BILITOT 0.55 12/11/2015 0834       Lab Results  Component Value Date   WBC 7.4 01/01/2016   HGB 11.1* 01/01/2016   HCT 32.5* 01/01/2016   MCV 91.5 01/01/2016   PLT 199 01/01/2016   NEUTROABS 4.4 01/01/2016     ASSESSMENT & PLAN:  Breast cancer of upper-outer quadrant of right female breast ([name]) 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%, PR 0%, 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 Metastatic disease), right upper lobe 4 mm lung nodule indeterminate  Recommendation: Genetic counseling  1. Neo-adjuvant chemotherapy with TCH Perjeta  (After neoadjuvant chemotherapy we will obtain breast MRI as well as repeat CT chest to evaluate the lung nodule and mediastinal mass)  2. Followed by surgery with most likely mastectomy  3. Followed by Antiestrogen therapy with tamoxifen  -----------------------------------------------------------------------------------------------------------------------  Current Treatment: Today is cycle 6 day 1  ECHO 09/12/15: EF 55-60%  Next echocardiogram scheduled for 12/17/2015 Rt Axill LN Biopsy: Positive   Chemo Toxicities: 1. Nausea grade 1 patient received Aloxi Emend and dexamethasone with chemotherapy. 2. Diarrhea 1 day 3. Severe bone pain related to Neulasta: D/C ed neulasta with cycle 2. 4. Upper respiratory tract infection with cycle 6  Monitoring closely for chemotherapy toxicities Intermittent migraine headaches  MRI breast has been ordered and she'll be presented in the tumor board. I will see her after that to discuss the final result.    No orders of the defined types were placed  in this encounter.   The patient has a good understanding of the overall plan. she agrees with it. she will call with any problems that may develop before the next visit here.   Rulon Eisenmenger, MD 01/01/2016

## 2016-01-01 NOTE — Telephone Encounter (Signed)
Appointments made and avs printed for patient °

## 2016-01-02 ENCOUNTER — Other Ambulatory Visit: Payer: Self-pay | Admitting: Hematology and Oncology

## 2016-01-02 DIAGNOSIS — C50411 Malignant neoplasm of upper-outer quadrant of right female breast: Secondary | ICD-10-CM

## 2016-01-09 ENCOUNTER — Ambulatory Visit
Admission: RE | Admit: 2016-01-09 | Discharge: 2016-01-09 | Disposition: A | Discharge status: Home / Self Care | Payer: BLUE CROSS/BLUE SHIELD | Source: Ambulatory Visit | Attending: Hematology and Oncology | Admitting: Hematology and Oncology

## 2016-01-09 DIAGNOSIS — C50411 Malignant neoplasm of upper-outer quadrant of right female breast: Secondary | ICD-10-CM

## 2016-01-09 MED ORDER — GADOBENATE DIMEGLUMINE 529 MG/ML IV SOLN
15.0000 mL | Freq: Once | INTRAVENOUS | Status: AC | PRN
  Administered 2016-01-09: 15 mL via INTRAVENOUS

## 2016-01-14 ENCOUNTER — Other Ambulatory Visit: Payer: Self-pay | Admitting: General Surgery

## 2016-01-14 DIAGNOSIS — C50511 Malignant neoplasm of lower-outer quadrant of right female breast: Secondary | ICD-10-CM

## 2016-01-15 NOTE — Assessment & Plan Note (Signed)
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 Metastatic disease), right upper lobe 4 mm lung nodule indeterminate  Recommendation: Genetic counseling  1. Neo-adjuvant chemotherapy with TCH Perjeta  (After neoadjuvant chemotherapy we will obtain breast MRI as well as repeat CT chest to evaluate the lung nodule and mediastinal mass)  2. Followed by surgery with most likely mastectomy  3. Followed by Antiestrogen therapy with tamoxifen  -----------------------------------------------------------------------------------------------------------------------  Breast MRI: 01/09/16: Significant impr in enhancement through out Rt Breast, small focus in the central portion, no sign NME retro areolar area, No susp LN.  Plan 1. Recommend mastectomy 2. CT chest 3. ALLIANCE Trial

## 2016-01-16 ENCOUNTER — Ambulatory Visit: Payer: BLUE CROSS/BLUE SHIELD | Admitting: Hematology and Oncology

## 2016-01-16 NOTE — Assessment & Plan Note (Signed)
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 Metastatic disease), right upper lobe 4 mm lung nodule indeterminate  Recommendation: Genetic counseling  1. Neo-adjuvant chemotherapy with [place] started 09/18/15 completed 01/01/16 2. Followed by surgery with most likely mastectomy  3. Followed by Antiestrogen therapy with tamoxifen  -----------------------------------------------------------------------------------------------------------------------  MRI Breast 01/09/16:Significantly improved enhancement in Rt breast, small enh in central portion, No susp LN  Radiology Review: Discussed the MRI report  Plan: 1. CT chest 2. Mastectomy 3. ALLIANCE trial  RTC after surgery

## 2016-01-17 ENCOUNTER — Ambulatory Visit: Payer: BLUE CROSS/BLUE SHIELD | Admitting: Genetic Counselor

## 2016-01-17 ENCOUNTER — Encounter: Payer: Self-pay | Admitting: Genetic Counselor

## 2016-01-17 ENCOUNTER — Other Ambulatory Visit (HOSPITAL_BASED_OUTPATIENT_CLINIC_OR_DEPARTMENT_OTHER): Payer: BLUE CROSS/BLUE SHIELD

## 2016-01-17 ENCOUNTER — Ambulatory Visit (HOSPITAL_BASED_OUTPATIENT_CLINIC_OR_DEPARTMENT_OTHER): Payer: BLUE CROSS/BLUE SHIELD | Admitting: Hematology and Oncology

## 2016-01-17 VITALS — BP 107/62 | HR 95 | Temp 98.0°F | Resp 18 | Wt 170.3 lb

## 2016-01-17 DIAGNOSIS — Z800 Family history of malignant neoplasm of digestive organs: Secondary | ICD-10-CM

## 2016-01-17 DIAGNOSIS — R591 Generalized enlarged lymph nodes: Secondary | ICD-10-CM | POA: Diagnosis not present

## 2016-01-17 DIAGNOSIS — C50411 Malignant neoplasm of upper-outer quadrant of right female breast: Secondary | ICD-10-CM

## 2016-01-17 DIAGNOSIS — Z8049 Family history of malignant neoplasm of other genital organs: Secondary | ICD-10-CM | POA: Insufficient documentation

## 2016-01-17 DIAGNOSIS — R590 Localized enlarged lymph nodes: Secondary | ICD-10-CM

## 2016-01-17 LAB — CBC WITH DIFFERENTIAL/PLATELET
BASO%: 0.3 % (ref 0.0–2.0)
Basophils Absolute: 0 10*3/uL (ref 0.0–0.1)
EOS%: 0 % (ref 0.0–7.0)
Eosinophils Absolute: 0 10*3/uL (ref 0.0–0.5)
HEMATOCRIT: 31.9 % — AB (ref 34.8–46.6)
HEMOGLOBIN: 10.7 g/dL — AB (ref 11.6–15.9)
LYMPH#: 2.5 10*3/uL (ref 0.9–3.3)
LYMPH%: 68.5 % — ABNORMAL HIGH (ref 14.0–49.7)
MCH: 31.1 pg (ref 25.1–34.0)
MCHC: 33.5 g/dL (ref 31.5–36.0)
MCV: 92.7 fL (ref 79.5–101.0)
MONO#: 0.6 10*3/uL (ref 0.1–0.9)
MONO%: 16.7 % — ABNORMAL HIGH (ref 0.0–14.0)
NEUT%: 14.5 % — AB (ref 38.4–76.8)
NEUTROS ABS: 0.5 10*3/uL — AB (ref 1.5–6.5)
Platelets: 196 10*3/uL (ref 145–400)
RBC: 3.44 10*6/uL — ABNORMAL LOW (ref 3.70–5.45)
RDW: 16.2 % — ABNORMAL HIGH (ref 11.2–14.5)
WBC: 3.7 10*3/uL — AB (ref 3.9–10.3)
nRBC: 0 % (ref 0–0)

## 2016-01-17 LAB — COMPREHENSIVE METABOLIC PANEL
ALBUMIN: 3.6 g/dL (ref 3.5–5.0)
ALT: 20 U/L (ref 0–55)
AST: 18 U/L (ref 5–34)
Alkaline Phosphatase: 54 U/L (ref 40–150)
Anion Gap: 8 mEq/L (ref 3–11)
BUN: 11 mg/dL (ref 7.0–26.0)
CHLORIDE: 108 meq/L (ref 98–109)
CO2: 26 mEq/L (ref 22–29)
CREATININE: 0.8 mg/dL (ref 0.6–1.1)
Calcium: 9 mg/dL (ref 8.4–10.4)
EGFR: >90 mL/min/{1.73_m2} (ref >90)
GLUCOSE: 94 mg/dL (ref 70–140)
POTASSIUM: 4.2 meq/L (ref 3.5–5.1)
SODIUM: 142 meq/L (ref 136–145)
Total Bilirubin: 0.36 mg/dL (ref 0.20–1.20)
Total Protein: 6.6 g/dL (ref 6.4–8.3)

## 2016-01-17 NOTE — Progress Notes (Signed)
REFERRING PROVIDER: Nicholas Lose, MD  PRIMARY PROVIDER:  [name] Patient  PRIMARY REASON FOR VISIT:  1. Breast cancer of upper-outer quadrant of right female breast ([place])   2. Family history of uterine cancer   3. Family history of stomach cancer      HISTORY OF PRESENT ILLNESS:   Lori Dodson, a 30 y.o. female, was seen for a [facility] cancer genetics consultation at the request of Dr. Lindi Adie due to a personal history of breast cancer at 39 and family history of endometrial and stomach cancers.  Lori Dodson presents to clinic today to discuss the possibility of a hereditary predisposition to cancer, genetic testing, and to further clarify her future cancer risks, as well as potential cancer risks for family members.   In October 2016, at the age of 3, Lori Dodson was diagnosed with invasive ductal carcinoma with DCIS of the right breast.  Hormone receptor status was ER+, PR-, and Her2+. This was treated with neoadjuvant chemotherapy and surgery is pending genetic test results.  Lori Dodson will have either mastectomy or double mastectomy and genetic testing will help inform this decision.    CANCER HISTORY:    Breast cancer of upper-outer quadrant of right female breast ([place])   08/29/2015 Mammogram right breast ill-defined mass 8:00 position, extensive pleomorphic malignant calcifications extending from the mass for approximately 10.5 cm   09/05/2015 Initial Diagnosis Right breast biopsy: Invasive ductal carcinoma with DCIS, grade 3, ER 20%, PR 0%, Ki-67 40%, HER-2 positive ratio 5.61; right biopsy 8:00: IDC grade 3, ER 50%, PR 5%, Ki-67 80%, HER-2 positive ratio 5.38   09/11/2015 Breast MRI Right breast middle depth: 2.7 cm; right breast posterior outer: 4.4 cm; right breast retroareolar 2.7 cm; probable right axillary and right axillary tail lymphadenopathy   09/18/2015 -  Neo-Adjuvant Chemotherapy [facility]   01/09/2016 Breast MRI Significantly improved enhancement in Rt breast, small enh in  central portion, No susp LN     HORMONAL RISK FACTORS:  Menarche was at age 73.  First live birth at age 50.  OCP use for approximately 6 years.  Ovaries intact: yes.  Hysterectomy: no.  Menopausal status: premenopausal.  HRT use: 0 years. Colonoscopy: n/a; not examined. Mammogram within the last year: this was her first her mammogram. Number of breast biopsies: multiple with this breast cancer Up to date with pelvic exams:  yes. Any excessive radiation exposure in the past:  no, but does report some hx of secondhand smoke exposure - her husband is a former smoker, but never smoked in the house.  Past Medical History  Diagnosis Date  . Breast cancer ([place]) 09/2015    Past Surgical History  Procedure Laterality Date  . [place]    . Portacath placement Left 09/13/2015    Procedure: ATTEMPTED INSERTION PORT-A-CATH;  Surgeon: Autumn Messing III, MD;  Location: [facility];  Service: General;  Laterality: Left;    Social History   Social History  . Marital Status: Married    Spouse Name: N/A  . Number of Children: N/A  . Years of Education: N/A   Social History Main Topics  . Smoking status: Never Smoker   . Smokeless tobacco: Never Used  . Alcohol Use: Yes     Comment: occasionally   . Drug Use: No  . Sexual Activity: Not Asked   Other Topics Concern  . None   Social History Narrative     FAMILY HISTORY:  We obtained a detailed,  4-generation family history.  Significant diagnoses are listed below: Family History  Problem Relation Age of Onset  . Endometrial cancer Mother 43  . Cancer Mother 78    additional cancer from "polyp in groin"  . Stomach cancer Maternal Grandfather     died a year before pt was born; treated with chemo; metastasis    Lori Dodson has two sons, ages 65 and 82.  She has no full siblings, but she does have two maternal half-sisters and one paternal half-sister, all between the ages of 99-40 and none of whom have ever been diagnosed  with cancer.  Lori Dodson mother is currently 14.  She has a history of endometrial cancer, diagnosed at age 81, and was, per her report, diagnosed with a new cancer that came from a 'polyp in her groin' about 4 years later.  Lori Dodson father is currently in his late 70s and has not had cancer to her knowledge, but she has limited information for him.    Lori Dodson mother has two full brothers who are currently in their mid-90s- age 18.  Neither of these brothers have had cancer.  The oldest brother has no children and the youngest brother has two sons who have never had cancer.  Lori Dodson maternal grandmother is currently 27 and has not had cancer.  She has two sisters and a brother, none of whom have had cancer.  Lori Dodson maternal grandfather died of stomach cancer a year before she was born.  He had four or five siblings, but Ms. Warzecha has no information for these relatives.    Lori Dodson father had no full siblings, but he did have two paternal half-brothers and two paternal half-sisters, none of whom have had cancer to Ms. Barriere's knowledge.  Lori Dodson never met her father's biological mother.  She has no further information for her paternal grandfather.    Patient's maternal and paternal ancestors are of African American descent. There is no reported Ashkenazi Jewish ancestry. There is no known consanguinity.  GENETIC COUNSELING ASSESSMENT: Lori Dodson is a 30 y.o. female with a personal and family history of cancer which is somewhat suggestive of a hereditary cancer syndrome and predisposition to cancer. We, therefore, discussed and recommended the following at today's visit.   DISCUSSION: We reviewed the characteristics, features and inheritance patterns of hereditary cancer syndromes, particularly those caused by mutations within the BRCA1/2, TP53, PTEN, and Lynch syndrome genes. We also discussed genetic testing, including the appropriate family members to test, the process of testing, insurance  coverage and turn-around-time for results. We discussed the implications of a negative, positive and/or variant of uncertain significant result. We recommended Lori Dodson pursue genetic testing for the 42-gene [facility] Common Hereditary Cancers (Breast, Gyn, GI) Panel through [facility], [place]).  The 42-gene Common Hereditary Cancers Panel (Breast, Gyn, GI) through [facility], [place]) includes sequencing and/or deletion/duplication analysis of the following genes: APC, ATM, AXIN2, BARD1, BMPR1A, BRCA1, BRCA2, BRIP1, CDH1, CDKN2A, CHEK2, DICER1, EPCAM, GREM1, KIT, MEN1, MLH1, MSH2, MSH6, MUTYH, NBN, NF1, PALB2, PDGFRA, PMS2, POLD1, POLE, PTEN, RAD50, RAD51C, RAD51D, SDHA, SDHB, SDHC, SDHD, SMAD4, SMARCA4, STK11, TP53, TSC1, TSC2, and VHL.  Based on Lori Dodson's personal and family history of cancer, she meets medical criteria for genetic testing. Despite that she meets criteria, she may still have an out of pocket cost. We discussed that if her out of pocket cost for testing is over $100, the laboratory will call and confirm  whether she wants to proceed with testing.  If the out of pocket cost of testing is less than $100 she will be billed by the genetic testing laboratory.   PLAN: After considering the risks, benefits, and limitations, Lori Dodson  provided informed consent to pursue genetic testing and the blood sample was sent to [facility] for analysis of the 42-gene [employer] Common Hereditary Cancers Panel (Breast, Gyn, GI). Results should be available within approximately 2-3 weeks' time, at which point they will be disclosed by telephone to Lori Dodson, as will any additional recommendations warranted by these results. Lori Dodson will receive a summary of her genetic counseling visit and a copy of her results once available. This information will also be available in Epic. We encouraged Ms. Likins to remain in contact with cancer genetics annually so that we can  continuously update the family history and inform her of any changes in cancer genetics and testing that may be of benefit for her family. Ms. Cockrum questions were answered to her satisfaction today. Our contact information was provided should additional questions or concerns arise.  Thank you for the referral and allowing [place] to share in the care of your patient.   Jeanine Luz, MS Genetic Counselor [email or web address] Phone: [phone]  The patient was seen for a total of 60 minutes in face-to-face genetic counseling.  This patient was discussed with Drs. Magrinat, Lindi Adie and/or Burr Medico who agrees with the above.    _______________________________________________________________________ For Office Staff:  Number of people involved in session: 1 Was an Intern/ student involved with case: no

## 2016-01-17 NOTE — Progress Notes (Signed)
Patient Care Team: [name] Patient as PCP - General (General Practice)  SUMMARY OF ONCOLOGIC HISTORY:   Breast cancer of upper-outer quadrant of right female breast ([place])   08/29/2015 Mammogram right breast ill-defined mass 8:00 position, extensive pleomorphic malignant calcifications extending from the mass for approximately 10.5 cm   09/05/2015 Initial Diagnosis Right breast biopsy: Invasive ductal carcinoma with DCIS, grade 3, ER 20%, PR 0%, Ki-67 40%, HER-2 positive ratio 5.61; right biopsy 8:00: IDC grade 3, ER 50%, PR 5%, Ki-67 80%, HER-2 positive ratio 5.38   09/11/2015 Breast MRI Right breast middle depth: 2.7 cm; right breast posterior outer: 4.4 cm; right breast retroareolar 2.7 cm; probable right axillary and right axillary tail lymphadenopathy   09/18/2015 -  Neo-Adjuvant Chemotherapy [facility]   01/09/2016 Breast MRI Significantly improved enhancement in Rt breast, small enh in central portion, No susp LN    CHIEF COMPLIANT: Follow-up to review breast MRI  INTERVAL HISTORY: Jazzie Trampe is a 30 year old with above-mentioned history of right breast cancer who is here to discuss a breast MRI report as well as to discuss a treatment plan. She was present yesterday the multidisciplinary tumor board. She is here today to discuss a final plan. Patient had excellent response to chemotherapy and had completed 6 cycles of chemotherapy and is going to be scheduled for maintenance Herceptin.  REVIEW OF SYSTEMS:   Constitutional: Denies fevers, chills or abnormal weight loss Eyes: Denies blurriness of vision Ears, nose, mouth, throat, and face: Denies mucositis or sore throat Respiratory: Denies cough, dyspnea or wheezes Cardiovascular: Denies palpitation, chest discomfort Gastrointestinal:  Denies nausea, heartburn or change in bowel habits Skin: Denies abnormal skin rashes Lymphatics: Denies new lymphadenopathy or easy bruising Neurological:Denies numbness, tingling or new  weaknesses Behavioral/Psych: Mood is stable, no new changes  Extremities: No lower extremity edema Breast:  denies any pain or lumps or nodules in either breasts All other systems were reviewed with the patient and are negative.  I have reviewed the past medical history, past surgical history, social history and family history with the patient and they are unchanged from previous note.  ALLERGIES:  has No Known Allergies.  MEDICATIONS:  Current Outpatient Prescriptions  Medication Sig Dispense Refill  . clindamycin (CLINDAGEL) 1 % gel Apply topically 2 (two) times daily. 30 g 0  . dexamethasone (DECADRON) 4 MG tablet Take 1 tablet (4 mg total) by mouth 2 (two) times daily. Start the day before Taxotere. Then again the day after chemo for 3 days. 30 tablet 1  . lidocaine-prilocaine (EMLA) cream Apply to affected area once 30 g 3  . LORazepam (ATIVAN) 0.5 MG tablet Take 1 tablet (0.5 mg total) by mouth 3 times/day as needed-between meals & bedtime for anxiety or sleep. 30 tablet 0  . ondansetron (ZOFRAN) 8 MG tablet Take 1 tablet (8 mg total) by mouth 2 (two) times daily. Start the day after chemo for 3 days. Then take as needed for nausea or vomiting. 30 tablet 1  . oxyCODONE-acetaminophen (PERCOCET/ROXICET) 5-325 MG tablet Take 1 tablet by mouth every 4 (four) hours as needed for severe pain. 30 tablet 0  . prochlorperazine (COMPAZINE) 10 MG tablet Take 1 tablet (10 mg total) by mouth every 6 (six) hours as needed (Nausea or vomiting). 30 tablet 1   No current facility-administered medications for this visit.    PHYSICAL EXAMINATION: ECOG PERFORMANCE STATUS: 1 - Symptomatic but completely ambulatory  Filed Vitals:   01/17/16 0906  BP: 107/62  Pulse: 95  Temp: 98 F (36.7 C)  Resp: 18   Filed Weights   01/17/16 0906  Weight: 170 lb 4.8 oz (77.248 kg)    GENERAL:alert, no distress and comfortable SKIN: skin color, texture, turgor are normal, no rashes or significant  lesions EYES: normal, Conjunctiva are pink and non-injected, sclera clear OROPHARYNX:no exudate, no erythema and lips, buccal mucosa, and tongue normal  NECK: supple, thyroid normal size, non-tender, without nodularity LYMPH:  no palpable lymphadenopathy in the cervical, axillary or inguinal LUNGS: clear to auscultation and percussion with normal breathing effort HEART: regular rate & rhythm and no murmurs and no lower extremity edema ABDOMEN:abdomen soft, non-tender and normal bowel sounds MUSCULOSKELETAL:no cyanosis of digits and no clubbing  NEURO: alert & oriented x 3 with fluent speech, no focal motor/sensory deficits EXTREMITIES: No lower extremity edema  LABORATORY DATA:  I have reviewed the data as listed   Chemistry      Component Value Date/Time   NA 139 01/01/2016 0816   NA 136 09/17/2015 0716   K 3.7 01/01/2016 0816   K 3.7 09/17/2015 0716   CL 102 09/17/2015 0716   CO2 19* 01/01/2016 0816   CO2 27 09/17/2015 0716   BUN 17.3 01/01/2016 0816   BUN 9 09/17/2015 0716   CREATININE 0.8 01/01/2016 0816   CREATININE 0.79 09/17/2015 0716      Component Value Date/Time   CALCIUM 9.1 01/01/2016 0816   CALCIUM 8.8* 09/17/2015 0716   ALKPHOS 59 01/01/2016 0816   AST 14 01/01/2016 0816   ALT 17 01/01/2016 0816   BILITOT 0.30 01/01/2016 0816      Lab Results  Component Value Date   WBC 7.4 01/01/2016   HGB 11.1* 01/01/2016   HCT 32.5* 01/01/2016   MCV 91.5 01/01/2016   PLT 199 01/01/2016   NEUTROABS 4.4 01/01/2016   ASSESSMENT & PLAN:  Breast cancer of upper-outer quadrant of right female breast ([place]) 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%, PR 0%, Ki-67 40%, HER-2 positive ratio 5.61; right biopsy 8:00: IDC grade 3, ER 50%, PR 5%, Ki-67 80%, HER-2 positive ratio 5.38  Mammogram 08/29/2015: Right breast ill-defined mass 8:00 position, extensive pleomorphic malignant calcifications extending from the mass for  approximately 10.5 cm   Breast MRI 09/11/15: 2.7 cm rt Breast middle depth; 4.4 cm posterior rt Breast; 2.7 cm DCIS retroareolar duct; probable Rt Axill LN and Axillary tail LN  Chest abdomen pelvis 10/04/2015: Right axillary lymphadenopathy, 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 Metastatic disease), right upper lobe 4 mm lung nodule indeterminate   Recommendation: Genetic counseling  1. Neo-adjuvant chemotherapy with [place] started 09/18/15 completed 01/01/16 2. Followed by surgery with most likely mastectomy  3. Followed by Antiestrogen therapy with tamoxifen  -----------------------------------------------------------------------------------------------------------------------  MRI Breast 01/09/16:Significantly improved enhancement in Rt breast, small enh in central portion, No susp LN  Radiology Review: Discussed the MRI report  Plan: 1. CT chest 2. Mastectomy 3. ALLIANCE trial  ALLIANCE [id number]: Phase 3 clinical trial in patients with node positive disease after neoadjuvant chemotherapy, if patient is positive for sentinel lymph node determined by intraoperative pathology on final pathology if axillary lymph node dissection was not performed, randomized to axillary lymph node dissection with nodal radiation versus axillary radiation and nodal radiation  We will her arrange for urgent genetic counseling today. For some reason it was missed previously. This might change her surgical decisions so  we will try to arrange for the test to be done expeditiously We appreciated the genetic counselors for agreeing to see her ASAP  RTC after surgery to make the final decision regarding ALLIANCE trial  No orders of the defined types were placed in this encounter.   The patient has a good understanding of the overall plan. she agrees with it. she will call with any problems that may develop before the next visit here.   Rulon Eisenmenger,  MD 01/17/2016

## 2016-01-21 ENCOUNTER — Ambulatory Visit (HOSPITAL_COMMUNITY)
Admission: RE | Admit: 2016-01-21 | Discharge: 2016-01-21 | Disposition: A | Discharge status: Home / Self Care | Payer: BLUE CROSS/BLUE SHIELD | Source: Ambulatory Visit | Attending: Hematology and Oncology | Admitting: Hematology and Oncology

## 2016-01-21 DIAGNOSIS — L720 Epidermal cyst: Secondary | ICD-10-CM | POA: Insufficient documentation

## 2016-01-21 DIAGNOSIS — Z853 Personal history of malignant neoplasm of breast: Secondary | ICD-10-CM | POA: Insufficient documentation

## 2016-01-21 DIAGNOSIS — Z08 Encounter for follow-up examination after completed treatment for malignant neoplasm: Secondary | ICD-10-CM | POA: Diagnosis not present

## 2016-01-21 DIAGNOSIS — R590 Localized enlarged lymph nodes: Secondary | ICD-10-CM

## 2016-01-21 DIAGNOSIS — R911 Solitary pulmonary nodule: Secondary | ICD-10-CM | POA: Diagnosis not present

## 2016-01-21 MED ORDER — IOHEXOL 300 MG/ML  SOLN
75.0000 mL | Freq: Once | INTRAMUSCULAR | Status: AC | PRN
  Administered 2016-01-21: 75 mL via INTRAVENOUS

## 2016-01-22 ENCOUNTER — Ambulatory Visit (HOSPITAL_BASED_OUTPATIENT_CLINIC_OR_DEPARTMENT_OTHER): Payer: BLUE CROSS/BLUE SHIELD

## 2016-01-22 VITALS — BP 105/61 | HR 70 | Temp 98.7°F | Resp 18

## 2016-01-22 DIAGNOSIS — Z5112 Encounter for antineoplastic immunotherapy: Secondary | ICD-10-CM

## 2016-01-22 DIAGNOSIS — C50411 Malignant neoplasm of upper-outer quadrant of right female breast: Secondary | ICD-10-CM

## 2016-01-22 MED ORDER — ACETAMINOPHEN 325 MG PO TABS
650.0000 mg | ORAL_TABLET | Freq: Once | ORAL | Status: AC
  Administered 2016-01-22: 650 mg via ORAL

## 2016-01-22 MED ORDER — ACETAMINOPHEN 325 MG PO TABS
ORAL_TABLET | ORAL | Status: AC
  Filled 2016-01-22: qty 2

## 2016-01-22 MED ORDER — SODIUM CHLORIDE 0.9 % IV SOLN
Freq: Once | INTRAVENOUS | Status: AC
  Administered 2016-01-22: 12:00:00 via INTRAVENOUS

## 2016-01-22 MED ORDER — DIPHENHYDRAMINE HCL 25 MG PO CAPS
ORAL_CAPSULE | ORAL | Status: AC
  Filled 2016-01-22: qty 2

## 2016-01-22 MED ORDER — HEPARIN SOD (PORK) LOCK FLUSH 100 UNIT/ML IV SOLN
500.0000 [IU] | Freq: Once | INTRAVENOUS | Status: AC | PRN
  Administered 2016-01-22: 500 [IU]
  Filled 2016-01-22: qty 5

## 2016-01-22 MED ORDER — SODIUM CHLORIDE 0.9% FLUSH
10.0000 mL | INTRAVENOUS | Status: DC | PRN
  Administered 2016-01-22: 10 mL
  Filled 2016-01-22: qty 10

## 2016-01-22 MED ORDER — DIPHENHYDRAMINE HCL 25 MG PO CAPS
50.0000 mg | ORAL_CAPSULE | Freq: Once | ORAL | Status: AC
  Administered 2016-01-22: 50 mg via ORAL

## 2016-01-22 MED ORDER — TRASTUZUMAB CHEMO INJECTION 440 MG
6.0000 mg/kg | Freq: Once | INTRAVENOUS | Status: AC
  Administered 2016-01-22: 462 mg via INTRAVENOUS
  Filled 2016-01-22: qty 22

## 2016-01-22 NOTE — Patient Instructions (Signed)
[facility] Discharge Instructions for Patients Receiving Chemotherapy  Today you received the following chemotherapy agents:  Herceptin  To help prevent nausea and vomiting after your treatment, we encourage you to take your nausea medication as prescribed.   If you develop nausea and vomiting that is not controlled by your nausea medication, call the clinic.   BELOW ARE SYMPTOMS THAT SHOULD BE REPORTED IMMEDIATELY:  *FEVER GREATER THAN 100.5 F  *CHILLS WITH OR WITHOUT FEVER  NAUSEA AND VOMITING THAT IS NOT CONTROLLED WITH YOUR NAUSEA MEDICATION  *UNUSUAL SHORTNESS OF BREATH  *UNUSUAL BRUISING OR BLEEDING  TENDERNESS IN MOUTH AND THROAT WITH OR WITHOUT PRESENCE OF ULCERS  *URINARY PROBLEMS  *BOWEL PROBLEMS  UNUSUAL RASH Items with * indicate a potential emergency and should be followed up as soon as possible.  Feel free to call the clinic you have any questions or concerns. The clinic phone number is [phone].  Please show the CHEMO ALERT CARD at check-in to the Emergency Department and triage nurse.   

## 2016-01-23 ENCOUNTER — Other Ambulatory Visit: Payer: Self-pay | Admitting: General Surgery

## 2016-01-23 DIAGNOSIS — C50911 Malignant neoplasm of unspecified site of right female breast: Secondary | ICD-10-CM

## 2016-01-24 ENCOUNTER — Telehealth: Payer: Self-pay | Admitting: Genetic Counselor

## 2016-01-24 ENCOUNTER — Other Ambulatory Visit: Payer: Self-pay | Admitting: General Surgery

## 2016-01-24 DIAGNOSIS — C50911 Malignant neoplasm of unspecified site of right female breast: Secondary | ICD-10-CM

## 2016-01-24 NOTE — Telephone Encounter (Signed)
Discussed with Ms. Moone that her genetic test result was negative for known pathogenic mutations within any of 42 genes that would cause her to be at an increased risk for breast, ovarian, uterine, or GI cancers.  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  Discussed that we treat this just like a negative result until it gets reclassified by the lab.  Encouraged Ms. Sampsel to keep her contact information up to date with [place] and to check in with [place] in the future about whether this has been updated at that time.  Discussed that women in the family are still considered to be at an increased risk for cancer, based on her history, her sisters could all be having their mammograms now based on her age of diagnosis.  Discussed that typically we recommend starting annual mammograms at the age of 69 or ten years younger than the earliest diagnosis in the immediate family.  In the future, Ms. Coley's nieces and sisters should make their primary doctors aware of the family cancer history, so that they may receive the most appropriate cancer screening.  Ms. Dimiceli should continue to follow her doctors recommendations for screening in the future.  Discussed that Ms. Shan's mother would likely be eligible for genetic testing based on her uterine cancer diagnosis at 42.  She is welcome to call me if she lives in the area, or to use the [email or web address] website to lookup genetic counselors in other cities.  Ms. Soza is happy to receive this result.  She knows she is welcome to call or email with any questions.

## 2016-01-25 ENCOUNTER — Ambulatory Visit: Payer: Self-pay | Admitting: Genetic Counselor

## 2016-01-25 DIAGNOSIS — Z1379 Encounter for other screening for genetic and chromosomal anomalies: Secondary | ICD-10-CM | POA: Insufficient documentation

## 2016-01-25 DIAGNOSIS — Z8049 Family history of malignant neoplasm of other genital organs: Secondary | ICD-10-CM

## 2016-01-25 DIAGNOSIS — C50411 Malignant neoplasm of upper-outer quadrant of right female breast: Secondary | ICD-10-CM

## 2016-01-25 DIAGNOSIS — Z800 Family history of malignant neoplasm of digestive organs: Secondary | ICD-10-CM

## 2016-01-25 NOTE — Progress Notes (Signed)
GENETIC TEST RESULT  HPI: Lori Dodson was previously seen in the [facility] due to a personal history of breast cancer at age 30, family history of early-onset uterine cancer, and family history of stomach cancer, and concerns regarding a hereditary predisposition to cancer. Please refer to our prior cancer genetics clinic note from January 17, 2016 for more information regarding Lori Dodson's medical, social and family histories, and our assessment and recommendations, at the time. Lori Dodson recent genetic test results were disclosed to her, as were recommendations warranted by these results. These results and recommendations are discussed in more detail below.  GENETIC TEST RESULTS: At the time of Lori Dodson's visit on 01/17/16, we recommended she pursue genetic testing of the 42-gene [employer] Common Hereditary Cancer Panel (Breast, Gyn, GI) through [facility].  The 42-gene [employer] Common Hereditary Cancers Panel (Breast, Gyn, GI) performed by [facility] [place]) includes sequencing and/or deletion/duplication analysis for the following genes: APC, ATM, AXIN2, BARD1, BMPR1A, BRCA1, BRCA2, BRIP1, CDH1, CDKN2A, CHEK2, DICER1, EPCAM, GREM1, KIT, MEN1, MLH1, MSH2, MSH6, MUTYH, NBN, NF1, PALB2, PDGFRA, PMS2, POLD1, POLE, PTEN, RAD50, RAD51C, RAD51D, SDHA, SDHB, SDHC, SDHD, SMAD4, SMARCA4, STK11, TP53, TSC1, TSC2, and VHL.  Those results are now back, the report date for which is January 24, 2016.  Genetic testing was normal, and did not reveal a deleterious mutation in these genes.  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  The test report will be scanned into EPIC and will be located under the Results Review tab in the Pathology>Molecular Pathology section.   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  At this time, it is unknown if this VUS is associated with an increased risk for cancer or if this is a normal finding. Since this VUS result is uncertain, it cannot help guide screening recommendations, and family members should not be tested for this VUS to help define their own cancer risks.  Also, we all have variants within our genes that make [place] unique individuals--most of these variants are benign.  Thus, we treat this VUS as a negative result.   With time, we suspect the lab will reclassify this variant and when they do, we will try to re-contact Lori Dodson to discuss the reclassification further.  We also encouraged Lori Dodson to contact us in a year or two to obtain an update on the status of this VUS.  We discussed with Lori Dodson that since the current genetic testing is not perfect, it is possible there may be a gene mutation in one of these genes that current testing cannot detect. We also discussed, that it is possible that another gene that has not yet been discovered, or that we have not yet tested, is responsible for the cancer diagnoses in the family, and it is, therefore, important to remain in touch with cancer genetics in the future so that we can continue to offer Lori Dodson the most up to date genetic testing.    CANCER SCREENING RECOMMENDATIONS:  Thus, we still do not have an explanation for the personal history of early-onset breast cancer.  This result may be reassuring and indicates that Lori Dodson likely does not have an increased risk for a future cancer due to a mutation in one of these genes. This normal test also suggests that Lori Dodson's cancer was  most likely not due to an inherited predisposition associated with one of these genes.  There is no other family history of breast cancer, though Lori Dodson's family is small.  Most cancers happen by chance and this negative test suggests that her cancer falls into this category.  We, therefore, recommended she continue to follow the cancer  management and screening guidelines provided by her oncology and primary healthcare providers.   RECOMMENDATIONS FOR FAMILY MEMBERS: Women in this family might be at some increased risk of developing cancer, over the general population risk, simply due to the family history of cancer. We recommended women in this family have a yearly mammogram beginning at age 65, or 63 years younger than the earliest onset of cancer, an an annual clinical breast exam, and perform monthly breast self-exams. Based on Lori Dodson's young age of diagnosis, her paternal half-sister and maternal half-sisters could all be having annual mammograms.  Lori Dodson nieces will likely also need to begin breast cancer screening at an earlier age.  Women in this family should also have a gynecological exam as recommended by their primary provider. All family members should have a colonoscopy by age 70.  Lori Dodson two young sons should make their future primary doctors aware of the family history of cancer and they are also welcome to check in with [place] regarding updated genetic testing options in the future.    Based on Lori Dodson's family history, we recommended her mother, who was diagnosed with uterine cancer at age 54, have genetic counseling and testing. Lori Dodson does not believe her mother would be interested in genetic counseling and testing, but she will let us know if we can be of any assistance in coordinating genetic counseling and/or testing for her.  FOLLOW-UP: Lastly, we discussed with Lori Dodson that cancer genetics is a rapidly advancing field and it is possible that new genetic tests will be appropriate for her and/or her family members in the future. We encouraged her to remain in contact with cancer genetics on an annual basis so we can update her personal and family histories and let her know of advances in cancer genetics that may benefit this family.   Our contact number was provided. Lori Dodson questions were answered to  her satisfaction, and she knows she is welcome to call us at anytime with additional questions or concerns.   Jeanine Luz, MS Genetic Counselor [email or web address] Phone: [phone]

## 2016-02-01 ENCOUNTER — Other Ambulatory Visit: Payer: Self-pay | Admitting: *Deleted

## 2016-02-01 ENCOUNTER — Telehealth: Payer: Self-pay | Admitting: Hematology and Oncology

## 2016-02-01 NOTE — Telephone Encounter (Signed)
s.w. pt and advised on March appts....pt ok and aware

## 2016-02-04 NOTE — Pre-Procedure Instructions (Signed)
Jilene Scism  02/04/2016     Your procedure is scheduled on : Monday February 11, 2016 at 11:30 AM.  Report to [facility] Admitting at 9:30 AM.  Call this number if you have problems the morning of surgery: [phone]    Remember:  Do not eat food or drink liquids after midnight.  Take these medicines the morning of surgery with A SIP OF WATER : Lorazepam (Ativan) if needed, Oxycodone if needed   Stop taking any aspirin, vitamins, herbal medications/supplements, Ibuprofen, Advil, Motrin, Aleve, etc on Wednesday March 8th (5 days prior to your surgery)   Do not wear jewelry, make-up or nail polish.  Do not wear lotions, powders, or perfumes.  You may NOT wear deodorant.  Do not shave 48 hours prior to surgery.   Do not bring valuables to the hospital.  [facility] is not responsible for any belongings or valuables.  Contacts, dentures or bridgework may not be worn into surgery.  Leave your suitcase in the car.  After surgery it may be brought to your room.  For patients admitted to the hospital, discharge time will be determined by your treatment team.  Patients discharged the day of surgery will not be allowed to drive home.   Name and phone number of your driver:    Special instructions:  Shower using CHG soap the night before and the morning of your surgery  Please read over the following fact sheets that you were given. Pain Booklet, Coughing and Deep Breathing and Surgical Site Infection Prevention

## 2016-02-05 ENCOUNTER — Encounter (HOSPITAL_COMMUNITY): Payer: Self-pay

## 2016-02-05 ENCOUNTER — Encounter (HOSPITAL_COMMUNITY)
Admission: RE | Admit: 2016-02-05 | Discharge: 2016-02-05 | Disposition: A | Discharge status: Home / Self Care | Payer: BLUE CROSS/BLUE SHIELD | Source: Ambulatory Visit | Attending: General Surgery | Admitting: General Surgery

## 2016-02-05 DIAGNOSIS — Z01812 Encounter for preprocedural laboratory examination: Secondary | ICD-10-CM | POA: Insufficient documentation

## 2016-02-05 DIAGNOSIS — C50911 Malignant neoplasm of unspecified site of right female breast: Secondary | ICD-10-CM | POA: Diagnosis not present

## 2016-02-05 LAB — BASIC METABOLIC PANEL
ANION GAP: 10 (ref 5–15)
BUN: 10 mg/dL (ref 6–20)
CHLORIDE: 109 mmol/L (ref 101–111)
CO2: 23 mmol/L (ref 22–32)
Calcium: 9.5 mg/dL (ref 8.9–10.3)
Creatinine, Ser: 0.96 mg/dL (ref 0.44–1.00)
Glucose, Bld: 107 mg/dL — ABNORMAL HIGH (ref 65–99)
POTASSIUM: 4.2 mmol/L (ref 3.5–5.1)
SODIUM: 142 mmol/L (ref 135–145)

## 2016-02-05 LAB — CBC
HCT: 35.8 % — ABNORMAL LOW (ref 36.0–46.0)
HEMOGLOBIN: 11.9 g/dL — AB (ref 12.0–15.0)
MCH: 30.8 pg (ref 26.0–34.0)
MCHC: 33.2 g/dL (ref 30.0–36.0)
MCV: 92.7 fL (ref 78.0–100.0)
PLATELETS: 232 10*3/uL (ref 150–400)
RBC: 3.86 MIL/uL — AB (ref 3.87–5.11)
RDW: 14.3 % (ref 11.5–15.5)
WBC: 5.3 10*3/uL (ref 4.0–10.5)

## 2016-02-05 LAB — HCG, SERUM, QUALITATIVE: PREG SERUM: NEGATIVE

## 2016-02-08 ENCOUNTER — Ambulatory Visit
Admission: RE | Admit: 2016-02-08 | Discharge: 2016-02-08 | Disposition: A | Discharge status: Home / Self Care | Payer: BLUE CROSS/BLUE SHIELD | Source: Ambulatory Visit | Attending: General Surgery | Admitting: General Surgery

## 2016-02-08 DIAGNOSIS — C50911 Malignant neoplasm of unspecified site of right female breast: Secondary | ICD-10-CM

## 2016-02-10 MED ORDER — CEFAZOLIN SODIUM-DEXTROSE 2-3 GM-% IV SOLR
2.0000 g | INTRAVENOUS | Status: AC
  Administered 2016-02-11: 2 g via INTRAVENOUS
  Filled 2016-02-10: qty 50

## 2016-02-11 ENCOUNTER — Encounter (HOSPITAL_COMMUNITY): Payer: Self-pay | Admitting: Certified Registered Nurse Anesthetist

## 2016-02-11 ENCOUNTER — Ambulatory Visit
Admission: RE | Admit: 2016-02-11 | Discharge: 2016-02-11 | Disposition: A | Discharge status: Home / Self Care | Payer: BLUE CROSS/BLUE SHIELD | Source: Ambulatory Visit | Attending: General Surgery | Admitting: General Surgery

## 2016-02-11 ENCOUNTER — Ambulatory Visit (HOSPITAL_COMMUNITY): Payer: BLUE CROSS/BLUE SHIELD | Admitting: Certified Registered Nurse Anesthetist

## 2016-02-11 ENCOUNTER — Ambulatory Visit (HOSPITAL_COMMUNITY)
Admission: RE | Admit: 2016-02-11 | Discharge: 2016-02-12 | Disposition: A | Discharge status: Home / Self Care | Payer: BLUE CROSS/BLUE SHIELD | Source: Ambulatory Visit | Attending: General Surgery | Admitting: General Surgery

## 2016-02-11 ENCOUNTER — Ambulatory Visit (HOSPITAL_COMMUNITY)
Admission: RE | Admit: 2016-02-11 | Discharge: 2016-02-11 | Disposition: A | Discharge status: Home / Self Care | Payer: BLUE CROSS/BLUE SHIELD | Source: Ambulatory Visit | Attending: General Surgery | Admitting: General Surgery

## 2016-02-11 ENCOUNTER — Encounter (HOSPITAL_COMMUNITY)
Admission: RE | Disposition: A | Discharge status: Home / Self Care | Payer: Self-pay | Source: Ambulatory Visit | Attending: General Surgery

## 2016-02-11 DIAGNOSIS — C50511 Malignant neoplasm of lower-outer quadrant of right female breast: Secondary | ICD-10-CM | POA: Insufficient documentation

## 2016-02-11 DIAGNOSIS — C50911 Malignant neoplasm of unspecified site of right female breast: Secondary | ICD-10-CM | POA: Diagnosis present

## 2016-02-11 DIAGNOSIS — Z853 Personal history of malignant neoplasm of breast: Secondary | ICD-10-CM | POA: Insufficient documentation

## 2016-02-11 DIAGNOSIS — C773 Secondary and unspecified malignant neoplasm of axilla and upper limb lymph nodes: Secondary | ICD-10-CM | POA: Diagnosis not present

## 2016-02-11 HISTORY — PX: RADIOACTIVE SEED GUIDED PARTIAL MASTECTOMY WITH AXILLARY SENTINEL LYMPH NODE BIOPSY: SHX6520

## 2016-02-11 SURGERY — RADIOACTIVE SEED GUIDED PARTIAL MASTECTOMY WITH AXILLARY SENTINEL LYMPH NODE BIOPSY
Anesthesia: General | Site: Breast | Laterality: Right

## 2016-02-11 MED ORDER — FENTANYL CITRATE (PF) 100 MCG/2ML IJ SOLN
INTRAMUSCULAR | Status: DC | PRN
  Administered 2016-02-11 (×3): 25 ug via INTRAVENOUS
  Administered 2016-02-11: 50 ug via INTRAVENOUS
  Administered 2016-02-11: 25 ug via INTRAVENOUS
  Administered 2016-02-11: 50 ug via INTRAVENOUS
  Administered 2016-02-11 (×2): 25 ug via INTRAVENOUS

## 2016-02-11 MED ORDER — MIDAZOLAM HCL 2 MG/2ML IJ SOLN
2.0000 mg | Freq: Once | INTRAMUSCULAR | Status: AC
  Administered 2016-02-11: 2 mg via INTRAVENOUS

## 2016-02-11 MED ORDER — PROPOFOL 10 MG/ML IV BOLUS
INTRAVENOUS | Status: AC
  Filled 2016-02-11: qty 20

## 2016-02-11 MED ORDER — BUPIVACAINE-EPINEPHRINE (PF) 0.5% -1:200000 IJ SOLN
INTRAMUSCULAR | Status: DC | PRN
  Administered 2016-02-11: 25 mL

## 2016-02-11 MED ORDER — HEPARIN SODIUM (PORCINE) 5000 UNIT/ML IJ SOLN
5000.0000 [IU] | Freq: Three times a day (TID) | INTRAMUSCULAR | Status: DC
  Administered 2016-02-12: 5000 [IU] via SUBCUTANEOUS
  Filled 2016-02-11: qty 1

## 2016-02-11 MED ORDER — BUPIVACAINE-EPINEPHRINE (PF) 0.25% -1:200000 IJ SOLN
INTRAMUSCULAR | Status: AC
  Filled 2016-02-11: qty 30

## 2016-02-11 MED ORDER — 0.9 % SODIUM CHLORIDE (POUR BTL) OPTIME
TOPICAL | Status: DC | PRN
  Administered 2016-02-11: 1000 mL

## 2016-02-11 MED ORDER — CHLORHEXIDINE GLUCONATE 4 % EX LIQD: 1.0000 "application " | Freq: Once | CUTANEOUS | Status: DC

## 2016-02-11 MED ORDER — LORAZEPAM 0.5 MG PO TABS: 0.5000 mg | ORAL_TABLET | Freq: Two times a day (BID) | ORAL | Status: DC | PRN

## 2016-02-11 MED ORDER — ROCURONIUM BROMIDE 50 MG/5ML IV SOLN
INTRAVENOUS | Status: AC
  Filled 2016-02-11: qty 1

## 2016-02-11 MED ORDER — LACTATED RINGERS IV SOLN
INTRAVENOUS | Status: DC | PRN
  Administered 2016-02-11 (×2): via INTRAVENOUS

## 2016-02-11 MED ORDER — MEPERIDINE HCL 25 MG/ML IJ SOLN: 6.2500 mg | INTRAMUSCULAR | Status: DC | PRN

## 2016-02-11 MED ORDER — PANTOPRAZOLE SODIUM 40 MG IV SOLR
40.0000 mg | Freq: Every day | INTRAVENOUS | Status: DC
  Administered 2016-02-11: 40 mg via INTRAVENOUS
  Filled 2016-02-11: qty 40

## 2016-02-11 MED ORDER — SODIUM CHLORIDE 0.9 % IJ SOLN
INTRAMUSCULAR | Status: AC
  Filled 2016-02-11: qty 10

## 2016-02-11 MED ORDER — LIDOCAINE HCL (CARDIAC) 20 MG/ML IV SOLN
INTRAVENOUS | Status: DC | PRN
  Administered 2016-02-11: 100 mg via INTRAVENOUS

## 2016-02-11 MED ORDER — FENTANYL CITRATE (PF) 100 MCG/2ML IJ SOLN
50.0000 ug | Freq: Once | INTRAMUSCULAR | Status: AC
  Administered 2016-02-11: 50 ug via INTRAVENOUS

## 2016-02-11 MED ORDER — MIDAZOLAM HCL 2 MG/2ML IJ SOLN
INTRAMUSCULAR | Status: AC
  Filled 2016-02-11: qty 2

## 2016-02-11 MED ORDER — ONDANSETRON HCL 4 MG/2ML IJ SOLN: 4.0000 mg | Freq: Four times a day (QID) | INTRAMUSCULAR | Status: DC | PRN

## 2016-02-11 MED ORDER — HYDROMORPHONE HCL 1 MG/ML IJ SOLN
0.5000 mg | INTRAMUSCULAR | Status: DC | PRN
  Administered 2016-02-11 (×2): 0.5 mg via INTRAVENOUS

## 2016-02-11 MED ORDER — ONDANSETRON HCL 4 MG/2ML IJ SOLN
INTRAMUSCULAR | Status: AC
  Filled 2016-02-11: qty 2

## 2016-02-11 MED ORDER — MIDAZOLAM HCL 2 MG/2ML IJ SOLN
INTRAMUSCULAR | Status: AC
  Administered 2016-02-11: 2 mg via INTRAVENOUS
  Filled 2016-02-11: qty 2

## 2016-02-11 MED ORDER — MIDAZOLAM HCL 5 MG/5ML IJ SOLN
INTRAMUSCULAR | Status: DC | PRN
  Administered 2016-02-11: 2 mg via INTRAVENOUS

## 2016-02-11 MED ORDER — FENTANYL CITRATE (PF) 100 MCG/2ML IJ SOLN
100.0000 ug | Freq: Once | INTRAMUSCULAR | Status: AC
  Administered 2016-02-11: 100 ug via INTRAVENOUS
  Filled 2016-02-11: qty 2

## 2016-02-11 MED ORDER — FENTANYL CITRATE (PF) 100 MCG/2ML IJ SOLN
INTRAMUSCULAR | Status: AC
  Administered 2016-02-11: 50 ug via INTRAVENOUS
  Filled 2016-02-11: qty 2

## 2016-02-11 MED ORDER — FENTANYL CITRATE (PF) 100 MCG/2ML IJ SOLN
INTRAMUSCULAR | Status: AC
  Filled 2016-02-11: qty 2

## 2016-02-11 MED ORDER — HYDROMORPHONE HCL 1 MG/ML IJ SOLN
INTRAMUSCULAR | Status: AC
  Filled 2016-02-11: qty 1

## 2016-02-11 MED ORDER — FENTANYL CITRATE (PF) 250 MCG/5ML IJ SOLN
INTRAMUSCULAR | Status: AC
  Filled 2016-02-11: qty 5

## 2016-02-11 MED ORDER — PROPOFOL 10 MG/ML IV BOLUS
INTRAVENOUS | Status: DC | PRN
  Administered 2016-02-11: 10 mg via INTRAVENOUS
  Administered 2016-02-11: 200 mg via INTRAVENOUS

## 2016-02-11 MED ORDER — TECHNETIUM TC 99M SULFUR COLLOID FILTERED
1.0000 | Freq: Once | INTRAVENOUS | Status: AC | PRN
Start: 2016-02-11 — End: 2016-02-11
  Administered 2016-02-11: 1 via INTRADERMAL

## 2016-02-11 MED ORDER — FENTANYL CITRATE (PF) 100 MCG/2ML IJ SOLN
INTRAMUSCULAR | Status: AC
  Administered 2016-02-11: 100 ug via INTRAVENOUS
  Filled 2016-02-11: qty 2

## 2016-02-11 MED ORDER — ONDANSETRON HCL 4 MG/2ML IJ SOLN
INTRAMUSCULAR | Status: DC | PRN
  Administered 2016-02-11: 4 mg via INTRAVENOUS

## 2016-02-11 MED ORDER — LACTATED RINGERS IV SOLN: INTRAVENOUS | Status: DC

## 2016-02-11 MED ORDER — PROMETHAZINE HCL 25 MG/ML IJ SOLN: 6.2500 mg | INTRAMUSCULAR | Status: DC | PRN

## 2016-02-11 MED ORDER — LACTATED RINGERS IV SOLN
INTRAVENOUS | Status: DC
  Administered 2016-02-11: 11:00:00 via INTRAVENOUS

## 2016-02-11 MED ORDER — LIDOCAINE HCL (CARDIAC) 20 MG/ML IV SOLN
INTRAVENOUS | Status: AC
  Filled 2016-02-11: qty 5

## 2016-02-11 MED ORDER — ONDANSETRON 4 MG PO TBDP: 4.0000 mg | ORAL_TABLET | Freq: Four times a day (QID) | ORAL | Status: DC | PRN

## 2016-02-11 MED ORDER — METHYLENE BLUE 0.5 % INJ SOLN
INTRAVENOUS | Status: AC
  Filled 2016-02-11: qty 10

## 2016-02-11 MED ORDER — OXYCODONE-ACETAMINOPHEN 5-325 MG PO TABS
1.0000 | ORAL_TABLET | ORAL | Status: DC | PRN
  Administered 2016-02-11 – 2016-02-12 (×3): 1 via ORAL
  Filled 2016-02-11 (×3): qty 1

## 2016-02-11 MED ORDER — ACETAMINOPHEN 325 MG PO TABS
650.0000 mg | ORAL_TABLET | Freq: Once | ORAL | Status: AC
  Administered 2016-02-11: 650 mg via ORAL
  Filled 2016-02-11: qty 2

## 2016-02-11 MED ORDER — KCL IN DEXTROSE-NACL 20-5-0.9 MEQ/L-%-% IV SOLN
INTRAVENOUS | Status: DC
  Administered 2016-02-11 – 2016-02-12 (×2): via INTRAVENOUS
  Filled 2016-02-11 (×3): qty 1000

## 2016-02-11 MED ORDER — MORPHINE SULFATE (PF) 2 MG/ML IV SOLN
1.0000 mg | INTRAVENOUS | Status: DC | PRN
  Administered 2016-02-11 – 2016-02-12 (×2): 2 mg via INTRAVENOUS
  Filled 2016-02-11 (×2): qty 1

## 2016-02-11 MED ORDER — FENTANYL CITRATE (PF) 100 MCG/2ML IJ SOLN
25.0000 ug | INTRAMUSCULAR | Status: DC | PRN
  Administered 2016-02-11: 25 ug via INTRAVENOUS
  Administered 2016-02-11: 50 ug via INTRAVENOUS
  Administered 2016-02-11: 25 ug via INTRAVENOUS

## 2016-02-11 MED ORDER — METHOCARBAMOL 500 MG PO TABS
500.0000 mg | ORAL_TABLET | Freq: Four times a day (QID) | ORAL | Status: DC | PRN
  Administered 2016-02-12 (×2): 500 mg via ORAL
  Filled 2016-02-11 (×2): qty 1

## 2016-02-11 SURGICAL SUPPLY — 56 items
APPLIER CLIP 9.375 MED OPEN (MISCELLANEOUS) ×6
BINDER BREAST LRG (GAUZE/BANDAGES/DRESSINGS) ×3 IMPLANT
BINDER BREAST XLRG (GAUZE/BANDAGES/DRESSINGS) IMPLANT
BLADE SURG 15 STRL LF DISP TIS (BLADE) ×1 IMPLANT
BLADE SURG 15 STRL SS (BLADE) ×2
CANISTER SUCTION 2500CC (MISCELLANEOUS) IMPLANT
CHLORAPREP W/TINT 26ML (MISCELLANEOUS) ×3 IMPLANT
CLIP APPLIE 9.375 MED OPEN (MISCELLANEOUS) ×2 IMPLANT
CONT SPEC 4OZ CLIKSEAL STRL BL (MISCELLANEOUS) ×9 IMPLANT
COVER PROBE W GEL 5X96 (DRAPES) ×3 IMPLANT
COVER SURGICAL LIGHT HANDLE (MISCELLANEOUS) ×3 IMPLANT
DEVICE DISSECT PLASMABLAD 3.0S (MISCELLANEOUS) ×1 IMPLANT
DEVICE DUBIN SPECIMEN MAMMOGRA (MISCELLANEOUS) ×3 IMPLANT
DRAIN CHANNEL 19F RND (DRAIN) ×3 IMPLANT
DRAPE CHEST BREAST 15X10 FENES (DRAPES) ×3 IMPLANT
DRAPE UTILITY XL STRL (DRAPES) ×3 IMPLANT
ELECT CAUTERY BLADE 6.4 (BLADE) ×3 IMPLANT
ELECT REM PT RETURN 9FT ADLT (ELECTROSURGICAL) ×3
ELECTRODE REM PT RTRN 9FT ADLT (ELECTROSURGICAL) ×1 IMPLANT
EVACUATOR SILICONE 100CC (DRAIN) ×3 IMPLANT
GLOVE BIO SURGEON STRL SZ7.5 (GLOVE) ×3 IMPLANT
GLOVE BIOGEL PI IND STRL 6.5 (GLOVE) ×1 IMPLANT
GLOVE BIOGEL PI INDICATOR 6.5 (GLOVE) ×2
GLOVE SURG SS PI 6.5 STRL IVOR (GLOVE) ×6 IMPLANT
GLOVE SURG SS PI 7.0 STRL IVOR (GLOVE) ×3 IMPLANT
GLOVE SURG SS PI 7.5 STRL IVOR (GLOVE) ×6 IMPLANT
GOWN STRL REUS W/ TWL LRG LVL3 (GOWN DISPOSABLE) ×2 IMPLANT
GOWN STRL REUS W/TWL LRG LVL3 (GOWN DISPOSABLE) ×4
KIT BASIN OR (CUSTOM PROCEDURE TRAY) ×3 IMPLANT
KIT MARKER MARGIN INK (KITS) ×3 IMPLANT
LIQUID BAND (GAUZE/BANDAGES/DRESSINGS) ×3 IMPLANT
NDL SAFETY ECLIPSE 18X1.5 (NEEDLE) IMPLANT
NEEDLE HYPO 18GX1.5 SHARP (NEEDLE)
NEEDLE HYPO 25X1 1.5 SAFETY (NEEDLE) ×3 IMPLANT
NS IRRIG 1000ML POUR BTL (IV SOLUTION) IMPLANT
PACK SURGICAL SETUP 50X90 (CUSTOM PROCEDURE TRAY) ×3 IMPLANT
PENCIL BUTTON HOLSTER BLD 10FT (ELECTRODE) ×3 IMPLANT
PLASMABLADE 3.0S (MISCELLANEOUS) ×3
SPECIMEN JAR MEDIUM (MISCELLANEOUS) ×3 IMPLANT
SPONGE LAP 18X18 X RAY DECT (DISPOSABLE) ×3 IMPLANT
SUT ETHILON 2 0 FS 18 (SUTURE) ×3 IMPLANT
SUT MNCRL AB 4-0 PS2 18 (SUTURE) ×9 IMPLANT
SUT SILK 2 0 SH (SUTURE) IMPLANT
SUT VIC AB 2-0 SH 18 (SUTURE) ×3 IMPLANT
SUT VIC AB 2-0 SH 27 (SUTURE) ×2
SUT VIC AB 2-0 SH 27XBRD (SUTURE) ×1 IMPLANT
SUT VIC AB 3-0 SH 18 (SUTURE) ×3 IMPLANT
SUT VIC AB 3-0 SH 27 (SUTURE) ×2
SUT VIC AB 3-0 SH 27X BRD (SUTURE) ×1 IMPLANT
SYR BULB 3OZ (MISCELLANEOUS) ×3 IMPLANT
SYR CONTROL 10ML LL (SYRINGE) ×3 IMPLANT
TOWEL OR 17X24 6PK STRL BLUE (TOWEL DISPOSABLE) ×3 IMPLANT
TOWEL OR 17X26 10 PK STRL BLUE (TOWEL DISPOSABLE) ×3 IMPLANT
TUBE CONNECTING 12'X1/4 (SUCTIONS)
TUBE CONNECTING 12X1/4 (SUCTIONS) IMPLANT
YANKAUER SUCT BULB TIP NO VENT (SUCTIONS) IMPLANT

## 2016-02-11 NOTE — Transfer of Care (Signed)
Immediate Anesthesia Transfer of Care Note  Patient: Lori Dodson  Procedure(s) Performed: Procedure(s): RIGHT MASTECTOMY WITH SENTINEL LYMPH NODE MAPPING AND RADIOACTIVE SEED GUIDED RIGHT NODE EXCISION (Right)  Patient Location: PACU  Anesthesia Type:General  Level of Consciousness: awake, alert  and oriented, anxious   Airway & Oxygen Therapy: Patient Spontanous Breathing and Patient connected to nasal cannula oxygen  Post-op Assessment: Report given to RN and Post -op Vital signs reviewed and stable  Post vital signs: Reviewed and stable  Last Vitals:  Filed Vitals:   02/11/16 1146 02/11/16 1456  BP: 109/67 109/64  Pulse: 99 88  Temp:  36.2 C  Resp:  12    Complications: No apparent anesthesia complications

## 2016-02-11 NOTE — Op Note (Signed)
02/11/2016  2:38 PM  PATIENT:  Lori Dodson  30 y.o. female  PRE-OPERATIVE DIAGNOSIS:  RIGHT BREAST CANCER  POST-OPERATIVE DIAGNOSIS:  RIGHT BREAST CANCER  PROCEDURE:  Procedure(s): RIGHT MASTECTOMY WITH SENTINEL LYMPH NODE MAPPING AND RADIOACTIVE SEED GUIDED RIGHT NODE EXCISION (Right)  SURGEON:  Surgeon(s) and Role:    * Jovita Kussmaul, MD - Primary  PHYSICIAN ASSISTANT:   ASSISTANTS: Judyann Munson, RNFA   ANESTHESIA:   general  EBL:  Total I/O In: 1300 [I.V.:1300] Out: 100 [Blood:100]  BLOOD ADMINISTERED:none  DRAINS: (1) Jackson-Pratt drain(s) with closed bulb suction in the prepectoral space   LOCAL MEDICATIONS USED:  NONE  SPECIMEN:  Source of Specimen:  right mastectomy and axillary nodes  DISPOSITION OF SPECIMEN:  PATHOLOGY  COUNTS:  YES  TOURNIQUET:  * No tourniquets in log *  DICTATION: .Dragon Dictation   After informed consent was obtained the patient was brought to the operating room and placed in the supine position on the operating room table. After adequate induction of general anesthesia the patient's right chest, breast, and axillary area were prepped with ChloraPrep, allowed to dry, and draped in usual sterile manner. An appropriate timeout was performed. The patient had initially a large cancer in the right breast with a positive axillary node. She received neoadjuvant chemotherapy and had a complete radiographic response. Several days ago for an I-125 seed was placed in the node that was previously biopsied in the right axilla. Earlier in the day the patient underwent injection of 1 mCi of technetium sulfur colloid in the subareolar position on the right. The neoprobe was set to technetium and there was a good signal in the right axilla. The neoprobe was also set to I-125 and we were able to identify an area of radioactivity corresponding to the seed in the right axilla. At this point an elliptical incision was made around the nipple and areola complex  in order to minimize the excess skin. The incision was carried through the skin and subcutaneous tissue sharply with the plasma blade. Rest x-ray used to elevate the skin flaps anteriorly towards the ceiling and thin skin flaps were created circumferentially between the breast tissue and subcutaneous fat. This dissection was carried all the way to the chest wall. Laterally the dissection was carried all the way to the latissimus muscle. Next the breast was removed from the pectoralis muscle with the pectoralis fascia also using the plasma blade. Once the breast was removed it was oriented with a stitch on the lateral aspect of the skin and sent to pathology for further evaluation. The neoprobe was initially set to technetium and I was able to identify several hot lymph nodes in the right axilla.  These were excised sharply with the plasma blade. The neoprobe was then set to I-125 in the area of radioactivity was identified very high in the right axilla. Dissection was carried out in the direction of the radioactive seed and in doing so several more probable lymph nodes were identified and removed. The seed was finally identified and it was not in a lymph node. The seed was essentially sitting on the right axillary vein. We carefully dissected bluntly with a right angle clamp in this area until we were able to identify the seed and remove it. It was placed in a container and x-rayed to confirm that it was the seed. It was the seed was then sent to pathology. After removing the palpable nodes we were able to identify the thoracodorsal and long  thoracic nerves as well as the axillary vein, latissimus muscle, and serratus muscle. We essentially performed a right axillary lymph node dissection. There were no palpable lymph nodes or radioactivity left in the right axilla. The wound was irrigated with copious amounts of saline. The wound was examined and found to be hemostatic. A small stab incision was made near the  anterior axillary line inferior to the operative bed with the blade knife. A tonsil clamp was placed through this incision into the operative bed and used to bring a 19 [place] round Blake drain into the operative bed. The drain was curled along the chest wall and axilla. The drain was anchored to the skin with a 3-0 nylon stitch. The superior and inferior flaps were grossly reapproximated with interrupted 3-0 Vicryl stitches. The skin was then closed with a running 4-0 Monocryl subcuticular stitch. Dermabond dressings were applied. The patient tolerated the procedure well. The drain was placed to bulb suction and there was a good seal. At the end of the case all needle sponge and his counts are correct. The patient was then awakened and taken to recovery in stable condition.  PLAN OF CARE: Admit for overnight observation  PATIENT DISPOSITION:  PACU - hemodynamically stable.   Delay start of Pharmacological VTE agent (>24hrs) due to surgical blood loss or risk of bleeding: no

## 2016-02-11 NOTE — Anesthesia Preprocedure Evaluation (Signed)
Anesthesia Evaluation  Patient identified by MRN, date of birth, ID band Patient awake    Reviewed: Allergy & Precautions, NPO status , Patient's Chart, lab work & pertinent test results  Airway Mallampati: II  TM Distance: >3 FB Neck ROM: Full    Dental no notable dental hx.    Pulmonary neg pulmonary ROS,    Pulmonary exam normal breath sounds clear to auscultation       Cardiovascular negative cardio ROS Normal cardiovascular exam Rhythm:Regular Rate:Normal     Neuro/Psych negative neurological ROS  negative psych ROS   GI/Hepatic negative GI ROS, Neg liver ROS,   Endo/Other  negative endocrine ROS  Renal/GU negative Renal ROS  negative genitourinary   Musculoskeletal negative musculoskeletal ROS (+)   Abdominal   Peds negative pediatric ROS (+)  Hematology negative hematology ROS (+)   Anesthesia Other Findings   Reproductive/Obstetrics negative OB ROS                             Anesthesia Physical Anesthesia Plan  ASA: I  Anesthesia Plan: General   Post-op Pain Management: GA combined w/ Regional for post-op pain   Induction: Intravenous  Airway Management Planned: LMA  Additional Equipment:   Intra-op Plan:   Post-operative Plan:   Informed Consent: I have reviewed the patients History and Physical, chart, labs and discussed the procedure including the risks, benefits and alternatives for the proposed anesthesia with the patient or authorized representative who has indicated his/her understanding and acceptance.   Dental advisory given  Plan Discussed with: CRNA  Anesthesia Plan Comments: (Pectoralis block with LMA)        Anesthesia Quick Evaluation

## 2016-02-11 NOTE — H&P (Signed)
Lori Dodson  Location: [facility] Surgery Patient #: [id number] DOB: June 04, 1986 Married / Language: English / Race: Black or African American Female   History of Present Illness  Patient words: discuss surgery.  The patient is a 30 year old female who presents for a follow-up for Breast cancer. The patient is a 30 year old black female who initially presented with a 10 cm area of calcification and invasive cancer in the lower outer right breast. She has undergone neoadjuvant chemotherapy and has responded well. The enhancement that was initially seen on her MRI has resolved. She is feeling well. Her last dose of chemotherapy was approximately 2 weeks ago.   Allergies No Known Drug Allergies10/01/2015  Medication History Ativan (0.5MG  Tablet, Oral) Active. Percocet (5-325MG  Tablet, Oral) Active. Medications Reconciled    Review of Systems  General Not Present- Appetite Loss, Chills, Fatigue, Fever, Night Sweats, Weight Gain and Weight Loss. Skin Not Present- Change in Wart/Mole, Dryness, Hives, Jaundice, New Lesions, Non-Healing Wounds, Rash and Ulcer. HEENT Present- Wears glasses/contact lenses. Not Present- Earache, Hearing Loss, Hoarseness, Nose Bleed, Oral Ulcers, Ringing in the Ears, Seasonal Allergies, Sinus Pain, Sore Throat, Visual Disturbances and Yellow Eyes. Respiratory Not Present- Bloody sputum, Chronic Cough, Difficulty Breathing, Snoring and Wheezing. Breast Present- Breast Mass and Breast Pain. Not Present- Nipple Discharge and Skin Changes. Cardiovascular Not Present- Chest Pain, Difficulty Breathing Lying Down, Leg Cramps, Palpitations, Rapid Heart Rate, Shortness of Breath and Swelling of Extremities. Gastrointestinal Present- Bloating and Constipation. Not Present- Abdominal Pain, Bloody Stool, Change in Bowel Habits, Chronic diarrhea, Difficulty Swallowing, Excessive gas, Gets full quickly at meals, Hemorrhoids, Indigestion, Nausea, Rectal Pain and  Vomiting. Female Genitourinary Not Present- Frequency, Nocturia, Painful Urination, Pelvic Pain and Urgency. Musculoskeletal Not Present- Back Pain, Joint Pain, Joint Stiffness, Muscle Pain, Muscle Weakness and Swelling of Extremities. Neurological Not Present- Decreased Memory, Fainting, Headaches, Numbness, Seizures, Tingling, Tremor, Trouble walking and Weakness. Psychiatric Not Present- Anxiety, Bipolar, Change in Sleep Pattern, Depression, Fearful and Frequent crying. Endocrine Not Present- Cold Intolerance, Excessive Hunger, Hair Changes, Heat Intolerance, Hot flashes and New Diabetes. Hematology Not Present- Easy Bruising, Excessive bleeding, Gland problems, HIV and Persistent Infections.  Vitals  Weight: 166.4 lb Height: 63in Body Surface Area: 1.79 m Body Mass Index: 29.48 kg/m  Temp.: 98.33F(Oral)  Pulse: 74 (Regular)  BP: 112/78 (Sitting, Left Arm, Standard)       Physical Exam General Mental Status-Alert. General Appearance-Consistent with stated age. Hydration-Well hydrated. Voice-Normal.  Head and Neck Head-normocephalic, atraumatic with no lesions or palpable masses. Trachea-midline. Thyroid Gland Characteristics - normal size and consistency.  Eye Eyeball - Bilateral-Extraocular movements intact. Sclera/Conjunctiva - Bilateral-No scleral icterus.  Chest and Lung Exam Chest and lung exam reveals -quiet, even and easy respiratory effort with no use of accessory muscles and on auscultation, normal breath sounds, no adventitious sounds and normal vocal resonance. Inspection Chest Wall - Normal. Back - normal.  Breast Note: There is no palpable mass in either breast. There is no palpable axillary, supraclavicular, or cervical lymphadenopathy.   Cardiovascular Cardiovascular examination reveals -normal heart sounds, regular rate and rhythm with no murmurs and normal pedal pulses  bilaterally.  Abdomen Inspection Inspection of the abdomen reveals - No Hernias. Skin - Scar - no surgical scars. Palpation/Percussion Palpation and Percussion of the abdomen reveal - Soft, Non Tender, No Rebound tenderness, No Rigidity (guarding) and No hepatosplenomegaly. Auscultation Auscultation of the abdomen reveals - Bowel sounds normal.  Neurologic Neurologic evaluation reveals -alert and oriented x 3 with no  impairment of recent or remote memory. Mental Status-Normal.  Musculoskeletal Normal Exam - Left-Upper Extremity Strength Normal and Lower Extremity Strength Normal. Normal Exam - Right-Upper Extremity Strength Normal and Lower Extremity Strength Normal.  Lymphatic Head & Neck  General Head & Neck Lymphatics: Bilateral - Description - Normal. Axillary  General Axillary Region: Bilateral - Description - Normal. Tenderness - Non Tender. Femoral & Inguinal  Generalized Femoral & Inguinal Lymphatics: Bilateral - Description - Normal. Tenderness - Non Tender.    Assessment & Plan  PRIMARY CANCER OF LOWER OUTER QUADRANT OF RIGHT FEMALE BREAST (C50.511) Impression: The patient had a known 10 cm area of invasive breast cancer in the outer right breast initially. She has responded well to neoadjuvant chemotherapy and the enhancement has resolved. She also had a positive lymph node initially that appears normal now. I have talked her in detail about the different options for treatment and at this point she has decided on a right mastectomy and sentinel node mapping. I have discussed with her in detail the risks and benefits of this operation as well as some of the technical aspects and she understands and wishes to proceed    Signed by Luella Cook, MD

## 2016-02-11 NOTE — Interval H&P Note (Signed)
History and Physical Interval Note:  02/11/2016 11:40 AM  Lori Dodson  has presented today for surgery, with the diagnosis of RIGHT BREAST CANCER  The various methods of treatment have been discussed with the patient and family. After consideration of risks, benefits and other options for treatment, the patient has consented to  Procedure(s): [place] (Right) as a surgical intervention .  The patient's history has been reviewed, patient examined, no change in status, stable for surgery.  I have reviewed the patient's chart and labs.  Questions were answered to the patient's satisfaction.     TOTH III,Ashima Shrake S

## 2016-02-11 NOTE — Anesthesia Procedure Notes (Addendum)
Anesthesia Regional Block:  Pectoralis block  Pre-Anesthetic Checklist: ,, timeout performed, Correct Patient, Correct Site, Correct Laterality, Correct Procedure, Correct Position, site marked, Risks and benefits discussed,  Surgical consent,  Pre-op evaluation,  At surgeon's request and post-op pain management  Laterality: Right  Prep: Maximum Sterile Barrier Precautions used and chloraprep       Needles:  Injection technique: Single-shot  Needle Type: Echogenic Stimulator Needle     Needle Length: 10cm 10 cm Needle Gauge: 21 and 21 G    Additional Needles:  Procedures: ultrasound guided (picture in chart) Pectoralis block Narrative:  Injection made incrementally with aspirations every 5 mL.  Performed by: Personally   Additional Notes: Patient tolerated the procedure well without complications   Procedure Name: Intubation Date/Time: 02/11/2016 12:17 PM Performed by: Garrison [name] T Pre-anesthesia Checklist: Patient identified, Emergency Drugs available, Suction available and Patient being monitored Patient Re-evaluated:Patient Re-evaluated prior to inductionOxygen Delivery Method: Circle system utilized Preoxygenation: Pre-oxygenation with 100% oxygen Intubation Type: IV induction Ventilation: Mask ventilation without difficulty LMA: LMA inserted LMA Size: 4.0 Number of attempts: 1 Placement Confirmation: positive ETCO2 and breath sounds checked- equal and bilateral Tube secured with: Tape Dental Injury: Teeth and Oropharynx as per pre-operative assessment

## 2016-02-12 ENCOUNTER — Encounter (HOSPITAL_COMMUNITY): Payer: Self-pay | Admitting: General Surgery

## 2016-02-12 ENCOUNTER — Ambulatory Visit: Payer: BLUE CROSS/BLUE SHIELD

## 2016-02-12 DIAGNOSIS — C50511 Malignant neoplasm of lower-outer quadrant of right female breast: Secondary | ICD-10-CM | POA: Diagnosis not present

## 2016-02-12 MED ORDER — KETOROLAC TROMETHAMINE 30 MG/ML IJ SOLN
30.0000 mg | Freq: Once | INTRAMUSCULAR | Status: AC
  Administered 2016-02-12: 30 mg via INTRAVENOUS
  Filled 2016-02-12: qty 1

## 2016-02-12 MED ORDER — OXYCODONE-ACETAMINOPHEN 5-325 MG PO TABS: 1.0000 | ORAL_TABLET | ORAL | Status: DC | PRN

## 2016-02-12 NOTE — Progress Notes (Signed)
1 Day Post-Op  Subjective: Complains of headache  Objective: Vital signs in last 24 hours: Temp:  [97.2 F (36.2 C)-98.8 F (37.1 C)] 98.8 F (37.1 C) (03/14 0538) Pulse Rate:  [57-126] 76 (03/14 0538) Resp:  [0-35] 18 (03/14 0538) BP: (95-139)/(54-99) 95/54 mmHg (03/14 0538) SpO2:  [89 %-100 %] 100 % (03/14 0538) Weight:  [75.297 kg (166 lb)-79.1 kg (174 lb 6.1 oz)] 79.1 kg (174 lb 6.1 oz) (03/14 0700)    Intake/Output from previous day: 03/13 0701 - 03/14 0700 In: 1300 [I.V.:1300] Out: 1064 [Urine:800; Drains:164; Blood:100] Intake/Output this shift:    Resp: clear to auscultation bilaterally Chest wall: skin flaps look good Cardio: regular rate and rhythm GI: soft, non-tender; bowel sounds normal; no masses,  no organomegaly  Lab Results:  No results for input(s): WBC, HGB, HCT, PLT in the last 72 hours. BMET No results for input(s): NA, K, CL, CO2, GLUCOSE, BUN, CREATININE, CALCIUM in the last 72 hours. PT/INR No results for input(s): LABPROT, INR in the last 72 hours. ABG No results for input(s): PHART, HCO3 in the last 72 hours.  Invalid input(s): PCO2, PO2  Studies/Results: Nm Sentinel Node Inj-no Rpt (breast)  02/11/2016  CLINICAL DATA: right breast cancer Sulfur colloid was injected intradermally by the nuclear medicine technologist for breast cancer sentinel node localization.   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 Ultrasound-guided radioactive seed placement was performed on 02/08/2016 for localization of the biopsied right axillary lymph node. EXAM: SPECIMEN RADIOGRAPH OF THE RIGHT BREAST COMPARISON:  Previous exam(s). FINDINGS: Status post excision of the right axilla. The radioactive seed is present and intact. No biopsy marker clip is present within the specimen. Findings were communicated to Dr. Marlou Starks via  telephone in the operating room at the time of interpretation. IMPRESSION: Specimen radiograph of the right breast. The radioactive seed is present and intact. No biopsy marker clip is present within the specimen. Electronically Signed   By: Pamelia Hoit M.D.   On: 02/11/2016 14:28    Anti-infectives: Anti-infectives    Start     Dose/Rate Route Frequency Ordered Stop   02/11/16 1000  ceFAZolin (ANCEF) IVPB 2 g/50 mL premix     2 g 100 mL/hr over 30 Minutes Intravenous To ShortStay Surgical 02/10/16 1429 02/11/16 1228      Assessment/Plan: s/p Procedure(s): RIGHT MASTECTOMY WITH SENTINEL LYMPH NODE MAPPING AND RADIOACTIVE SEED GUIDED RIGHT NODE EXCISION (Right) Advance diet  Try toradol for headache May be ready for discharge later today Teach pt drain care     TOTH III,Leary Mcnulty S 02/12/2016

## 2016-02-12 NOTE — Progress Notes (Signed)
Lori Dodson to be D/C'd home per MD order. Discussed with the patient and all questions fully answered.  VSS, Surgical incision site clean, dry, intact with ABD/breast binder in place.  IV catheter discontinued intact. Site without signs and symptoms of complications. Dressing and pressure applied.  An After Visit Summary was printed and given to the patient. Patient received prescription. Patient and husband received teaching regarding JP drain care. Able to verbalize and return demonstrate understanding.  D/c education completed with patient/family including follow up instructions, medication list, d/c activities limitations if indicated, with other d/c instructions as indicated by MD - patient able to verbalize understanding, all questions fully answered.   Patient instructed to return to ED, call 911, or call MD for any changes in condition.   Patient to be escorted via [place], and D/C home via private auto.

## 2016-02-12 NOTE — Anesthesia Postprocedure Evaluation (Signed)
Anesthesia Post Note  Patient: Lori Dodson  Procedure(s) Performed: Procedure(s) (LRB): RIGHT MASTECTOMY WITH SENTINEL LYMPH NODE MAPPING AND RADIOACTIVE SEED GUIDED RIGHT NODE EXCISION (Right)  Patient location during evaluation: PACU Anesthesia Type: General Level of consciousness: awake and alert Pain management: pain level controlled Vital Signs Assessment: post-procedure vital signs reviewed and stable Respiratory status: spontaneous breathing, nonlabored ventilation, respiratory function stable and patient connected to nasal cannula oxygen Cardiovascular status: blood pressure returned to baseline and stable Postop Assessment: no signs of nausea or vomiting Anesthetic complications: no    Last Vitals:  Filed Vitals:   02/11/16 2218 02/12/16 0538  BP: 104/72 95/54  Pulse: 72 76  Temp: 36.7 C 37.1 C  Resp: 18 18    Last Pain:  Filed Vitals:   02/12/16 1037  PainSc: 4                  Montez Hageman

## 2016-02-17 NOTE — Assessment & Plan Note (Signed)
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%, PR 0%, Ki-67 40%, HER-2 positive ratio 5.61; right biopsy 8:00: IDC grade 3, ER 50%, PR 5%, Ki-67 80%, HER-2 positive ratio 5.38  Mammogram 08/29/2015: Right breast ill-defined mass 8:00 position, extensive pleomorphic malignant calcifications extending from the mass for approximately 10.5 cm  Rt Mastectomy: 02/11/16: Path CR 0/16 LN neg Treatment Plan: 1. No role of XRT 2. Adjuvant Anti estrogen therapy with Tamoxifen 20 mg daily X 10 years  RTC 3 months

## 2016-02-18 ENCOUNTER — Ambulatory Visit (HOSPITAL_BASED_OUTPATIENT_CLINIC_OR_DEPARTMENT_OTHER): Payer: BLUE CROSS/BLUE SHIELD | Admitting: Hematology and Oncology

## 2016-02-18 ENCOUNTER — Encounter: Payer: Self-pay | Admitting: Hematology and Oncology

## 2016-02-18 ENCOUNTER — Telehealth: Payer: Self-pay | Admitting: Hematology and Oncology

## 2016-02-18 VITALS — BP 107/68 | HR 76 | Temp 98.0°F | Resp 18 | Wt 164.4 lb

## 2016-02-18 DIAGNOSIS — C50411 Malignant neoplasm of upper-outer quadrant of right female breast: Secondary | ICD-10-CM | POA: Diagnosis not present

## 2016-02-18 MED ORDER — TAMOXIFEN CITRATE 20 MG PO TABS: 20.0000 mg | ORAL_TABLET | Freq: Every day | ORAL | Status: DC

## 2016-02-18 MED ORDER — HYDROMORPHONE HCL 2 MG PO TABS: 2.0000 mg | ORAL_TABLET | ORAL | Status: DC | PRN

## 2016-02-18 NOTE — Telephone Encounter (Signed)
appt made and avs printed °

## 2016-02-18 NOTE — Progress Notes (Signed)
Patient Care Team: [name] Patient as PCP - General (General Practice)  SUMMARY OF ONCOLOGIC HISTORY:   Breast cancer of upper-outer quadrant of right female breast ([place])   08/29/2015 Mammogram right breast ill-defined mass 8:00 position, extensive pleomorphic malignant calcifications extending from the mass for approximately 10.5 cm   09/05/2015 Initial Diagnosis Right breast biopsy: Invasive ductal carcinoma with DCIS, grade 3, ER 20%, PR 0%, Ki-67 40%, HER-2 positive ratio 5.61; right biopsy 8:00: IDC grade 3, ER 50%, PR 5%, Ki-67 80%, HER-2 positive ratio 5.38   09/11/2015 Breast MRI Right breast middle depth: 2.7 cm; right breast posterior outer: 4.4 cm; right breast retroareolar 2.7 cm; probable right axillary and right axillary tail lymphadenopathy   09/18/2015 - 01/01/2016 Neo-Adjuvant Chemotherapy [facility]   01/09/2016 Breast MRI Significantly improved enhancement in Rt breast, small enh in central portion, No susp LN   02/11/2016 Surgery Rt Mastectomy: No residual cancer 0/16 LN Negative for cancer.    CHIEF COMPLIANT: follow-up after mastectomy  INTERVAL HISTORY: Lori Dodson is a 30 year old with above-mentioned history right breast cancer underwent neoadjuvant chemotherapy followed by recent mastectomy with axillary lymph node dissection. She is in extreme amount of pain in the right axilla. She reports that the Percocet is not working. Even though she did take 2 Percocets, the pain was not getting better. The drains are draining about 25 mL per day. She thinks most of the pain is related to the drains. Denies any fevers or chills. She is extremely numb under the arm.  REVIEW OF SYSTEMS:   Constitutional: Denies fevers, chills or abnormal weight loss Eyes: Denies blurriness of vision Ears, nose, mouth, throat, and face: Denies mucositis or sore throat Respiratory: Denies cough, dyspnea or wheezes Cardiovascular: Denies palpitation, chest discomfort Gastrointestinal:  Denies  nausea, heartburn or change in bowel habits Skin: Denies abnormal skin rashes Lymphatics: Denies new lymphadenopathy or easy bruising Neurological:Denies numbness, tingling or new weaknesses Behavioral/Psych: Mood is stable, no new changes  Extremities: No lower extremity edema Breast: recent right mastectomy with the pain and numbness All other systems were reviewed with the patient and are negative.  I have reviewed the past medical history, past surgical history, social history and family history with the patient and they are unchanged from previous note.  ALLERGIES:  is allergic to latex.  MEDICATIONS:  Current Outpatient Prescriptions  Medication Sig Dispense Refill  . clindamycin (CLINDAGEL) 1 % gel Apply topically 2 (two) times daily. (Patient taking differently: Apply 1 application topically 2 (two) times daily as needed (for acne). ) 30 g 0  . lidocaine-prilocaine (EMLA) cream Apply to affected area once 30 g 3  . LORazepam (ATIVAN) 0.5 MG tablet Take 1 tablet (0.5 mg total) by mouth 3 times/day as needed-between meals & bedtime for anxiety or sleep. 30 tablet 0  . oxyCODONE-acetaminophen (PERCOCET/ROXICET) 5-325 MG tablet Take 1 tablet by mouth every 4 (four) hours as needed for severe pain. (Patient taking differently: Take 1 tablet by mouth every 6 (six) hours as needed (for migraines). ) 30 tablet 0  . oxyCODONE-acetaminophen (ROXICET) 5-325 MG tablet Take 1-2 tablets by mouth every 4 (four) hours as needed. 50 tablet 0   No current facility-administered medications for this visit.    PHYSICAL EXAMINATION: ECOG PERFORMANCE STATUS: 2 - Symptomatic, <50% confined to bed  Filed Vitals:   02/18/16 1506  BP: 107/68  Pulse: 76  Temp: 98 F (36.7 C)  Resp: 18   Filed Weights  02/18/16 1506  Weight: 164 lb 6.4 oz (74.571 kg)    GENERAL:alert, no distress and comfortable SKIN: skin color, texture, turgor are normal, no rashes or significant lesions EYES: normal,  Conjunctiva are pink and non-injected, sclera clear OROPHARYNX:no exudate, no erythema and lips, buccal mucosa, and tongue normal  NECK: supple, thyroid normal size, non-tender, without nodularity LYMPH:  no palpable lymphadenopathy in the cervical, axillary or inguinal LUNGS: clear to auscultation and percussion with normal breathing effort HEART: regular rate & rhythm and no murmurs and no lower extremity edema ABDOMEN:abdomen soft, non-tender and normal bowel sounds MUSCULOSKELETAL:no cyanosis of digits and no clubbing  NEURO: alert & oriented x 3 with fluent speech, no focal motor/sensory deficits EXTREMITIES: No lower extremity edema  LABORATORY DATA:  I have reviewed the data as listed   Chemistry      Component Value Date/Time   NA 142 02/05/2016 1032   NA 142 01/17/2016 1117   K 4.2 02/05/2016 1032   K 4.2 01/17/2016 1117   CL 109 02/05/2016 1032   CO2 23 02/05/2016 1032   CO2 26 01/17/2016 1117   BUN 10 02/05/2016 1032   BUN 11.0 01/17/2016 1117   CREATININE 0.96 02/05/2016 1032   CREATININE 0.8 01/17/2016 1117      Component Value Date/Time   CALCIUM 9.5 02/05/2016 1032   CALCIUM 9.0 01/17/2016 1117   ALKPHOS 54 01/17/2016 1117   AST 18 01/17/2016 1117   ALT 20 01/17/2016 1117   BILITOT 0.36 01/17/2016 1117       Lab Results  Component Value Date   WBC 5.3 02/05/2016   HGB 11.9* 02/05/2016   HCT 35.8* 02/05/2016   MCV 92.7 02/05/2016   PLT 232 02/05/2016   NEUTROABS 0.5* 01/17/2016     ASSESSMENT & PLAN:  Breast cancer of upper-outer quadrant of right female breast ([place]) 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%, PR 0%, Ki-67 40%, HER-2 positive ratio 5.61; right biopsy 8:00: IDC grade 3, ER 50%, PR 5%, Ki-67 80%, HER-2 positive ratio 5.38  Clinical staging: T2 multifocal N1 M0 stage IIB Mammogram 08/29/2015: Right breast ill-defined mass 8:00 position, extensive pleomorphic malignant  calcifications extending from the mass for approximately 10.5 cm Pathological staging:ypT0N0  Rt Mastectomy: 02/11/16: Path CR 0/16 LN neg Treatment Plan: Tumor board presentation 1. No role of XRT 2. Adjuvant Anti estrogen therapy with Tamoxifen 20 mg daily X 10 years 3. Continue with Herceptin maintenance every 3 weeks.  Severe postsurgical pain: Not responding to Percocet. I gave her a prescription for Dilaudid today. I discussed with her about constipation management.  RTC 3 months   No orders of the defined types were placed in this encounter.   The patient has a good understanding of the overall plan. she agrees with it. she will call with any problems that may develop before the next visit here.   Rulon Eisenmenger, MD 02/18/2016

## 2016-02-21 ENCOUNTER — Other Ambulatory Visit: Payer: Self-pay | Admitting: *Deleted

## 2016-02-21 ENCOUNTER — Telehealth: Payer: Self-pay | Admitting: Hematology and Oncology

## 2016-02-21 NOTE — Telephone Encounter (Signed)
Spoke with patient to confirm added appts per 3/23 pof

## 2016-03-04 ENCOUNTER — Encounter: Payer: Self-pay | Admitting: Hematology and Oncology

## 2016-03-04 ENCOUNTER — Ambulatory Visit (HOSPITAL_BASED_OUTPATIENT_CLINIC_OR_DEPARTMENT_OTHER): Payer: BLUE CROSS/BLUE SHIELD

## 2016-03-04 ENCOUNTER — Other Ambulatory Visit (HOSPITAL_BASED_OUTPATIENT_CLINIC_OR_DEPARTMENT_OTHER): Payer: BLUE CROSS/BLUE SHIELD

## 2016-03-04 VITALS — BP 104/63 | HR 75 | Temp 98.2°F | Resp 18

## 2016-03-04 DIAGNOSIS — Z5112 Encounter for antineoplastic immunotherapy: Secondary | ICD-10-CM | POA: Diagnosis not present

## 2016-03-04 DIAGNOSIS — C50411 Malignant neoplasm of upper-outer quadrant of right female breast: Secondary | ICD-10-CM | POA: Diagnosis not present

## 2016-03-04 LAB — COMPREHENSIVE METABOLIC PANEL
ALBUMIN: 3.6 g/dL (ref 3.5–5.0)
ALK PHOS: 60 U/L (ref 40–150)
ALT: 16 U/L (ref 0–55)
AST: 17 U/L (ref 5–34)
Anion Gap: 7 mEq/L (ref 3–11)
BUN: 15.7 mg/dL (ref 7.0–26.0)
CO2: 27 mEq/L (ref 22–29)
CREATININE: 0.9 mg/dL (ref 0.6–1.1)
Calcium: 9.5 mg/dL (ref 8.4–10.4)
Chloride: 108 mEq/L (ref 98–109)
EGFR: >90 mL/min/{1.73_m2} (ref >90)
GLUCOSE: 100 mg/dL (ref 70–140)
POTASSIUM: 4.5 meq/L (ref 3.5–5.1)
SODIUM: 143 meq/L (ref 136–145)
TOTAL PROTEIN: 6.9 g/dL (ref 6.4–8.3)
Total Bilirubin: 0.54 mg/dL (ref 0.20–1.20)

## 2016-03-04 LAB — CBC WITH DIFFERENTIAL/PLATELET
BASO%: 0.6 % (ref 0.0–2.0)
Basophils Absolute: 0 10*3/uL (ref 0.0–0.1)
EOS%: 2.8 % (ref 0.0–7.0)
Eosinophils Absolute: 0.2 10*3/uL (ref 0.0–0.5)
HCT: 37 % (ref 34.8–46.6)
HEMOGLOBIN: 12.5 g/dL (ref 11.6–15.9)
LYMPH#: 2.9 10*3/uL (ref 0.9–3.3)
LYMPH%: 51.6 % — ABNORMAL HIGH (ref 14.0–49.7)
MCH: 31.1 pg (ref 25.1–34.0)
MCHC: 33.8 g/dL (ref 31.5–36.0)
MCV: 92.1 fL (ref 79.5–101.0)
MONO#: 0.3 10*3/uL (ref 0.1–0.9)
MONO%: 4.8 % (ref 0.0–14.0)
NEUT%: 40.2 % (ref 38.4–76.8)
NEUTROS ABS: 2.3 10*3/uL (ref 1.5–6.5)
Platelets: 214 10*3/uL (ref 145–400)
RBC: 4.02 10*6/uL (ref 3.70–5.45)
RDW: 13 % (ref 11.2–14.5)
WBC: 5.7 10*3/uL (ref 3.9–10.3)

## 2016-03-04 MED ORDER — HEPARIN SOD (PORK) LOCK FLUSH 100 UNIT/ML IV SOLN
500.0000 [IU] | Freq: Once | INTRAVENOUS | Status: AC | PRN
  Administered 2016-03-04: 500 [IU]
  Filled 2016-03-04: qty 5

## 2016-03-04 MED ORDER — SODIUM CHLORIDE 0.9 % IV SOLN
Freq: Once | INTRAVENOUS | Status: AC
  Administered 2016-03-04: 12:00:00 via INTRAVENOUS

## 2016-03-04 MED ORDER — SODIUM CHLORIDE 0.9% FLUSH
10.0000 mL | INTRAVENOUS | Status: DC | PRN
  Administered 2016-03-04: 10 mL
  Filled 2016-03-04: qty 10

## 2016-03-04 MED ORDER — DIPHENHYDRAMINE HCL 25 MG PO CAPS
50.0000 mg | ORAL_CAPSULE | Freq: Once | ORAL | Status: AC
  Administered 2016-03-04: 50 mg via ORAL

## 2016-03-04 MED ORDER — ACETAMINOPHEN 325 MG PO TABS
ORAL_TABLET | ORAL | Status: AC
  Filled 2016-03-04: qty 2

## 2016-03-04 MED ORDER — TRASTUZUMAB CHEMO INJECTION 440 MG
6.0000 mg/kg | Freq: Once | INTRAVENOUS | Status: AC
  Administered 2016-03-04: 462 mg via INTRAVENOUS
  Filled 2016-03-04: qty 22

## 2016-03-04 MED ORDER — DIPHENHYDRAMINE HCL 25 MG PO CAPS
ORAL_CAPSULE | ORAL | Status: AC
  Filled 2016-03-04: qty 1

## 2016-03-04 MED ORDER — ACETAMINOPHEN 325 MG PO TABS
650.0000 mg | ORAL_TABLET | Freq: Once | ORAL | Status: AC
  Administered 2016-03-04: 650 mg via ORAL

## 2016-03-04 NOTE — Patient Instructions (Signed)
Pt declined AVS.

## 2016-03-04 NOTE — Progress Notes (Signed)
Fax done 09/18/15 I sent to medical records

## 2016-03-04 NOTE — Progress Notes (Signed)
Completed patient's application through [employer]. Patient approved for $10,000 with a lookback date of 09/06/15-03/04/17. Delivered physician form to RN for Dr.Gudena to sign and return to me. Gave patient a copy of approval letter. Sent a copy of award letter and POE to billing. Patient has my card for any additional questions or concerns.

## 2016-03-05 ENCOUNTER — Telehealth: Payer: Self-pay | Admitting: Hematology and Oncology

## 2016-03-05 ENCOUNTER — Encounter: Payer: Self-pay | Admitting: Hematology and Oncology

## 2016-03-05 NOTE — Progress Notes (Signed)
Went to pick up signed physician form for [employer]. Faxed to [employer]. Fax received ok per confirmation sheet.

## 2016-03-05 NOTE — Telephone Encounter (Signed)
Pt called needing to r/s 4/25 appt to 4/24.pt aware of new date/time

## 2016-03-17 ENCOUNTER — Encounter: Payer: Self-pay | Admitting: Physical Therapy

## 2016-03-17 ENCOUNTER — Ambulatory Visit: Payer: BLUE CROSS/BLUE SHIELD | Attending: General Surgery | Admitting: Physical Therapy

## 2016-03-17 DIAGNOSIS — M25611 Stiffness of right shoulder, not elsewhere classified: Secondary | ICD-10-CM | POA: Insufficient documentation

## 2016-03-17 DIAGNOSIS — M6281 Muscle weakness (generalized): Secondary | ICD-10-CM | POA: Diagnosis present

## 2016-03-17 NOTE — Therapy (Deleted)
[place], [place], [address] Phone: [phone]   Fax:  [phone]  Physical Therapy Evaluation  Patient Details  Name: Lori Dodson MRN: [id number] Date of Birth: 08-Nov-1986 Referring Provider: Marlou Starks  Encounter Date: 03/17/2016      PT End of Session - 03/17/16 1352    Visit Number 1   Number of Visits 8   Date for PT Re-Evaluation 04/14/16   PT Start Time 1300   PT Stop Time 1345   PT Time Calculation (min) 45 min   Activity Tolerance Patient tolerated treatment well   Behavior During Therapy [facility] for tasks assessed/performed      Past Medical History  Diagnosis Date  . Breast cancer ([place]) 09/2015    Right breast    Past Surgical History  Procedure Laterality Date  . [place]    . Portacath placement Left 09/13/2015    Procedure: ATTEMPTED INSERTION PORT-A-CATH;  Surgeon: Autumn Messing III, MD;  Location: [facility];  Service: General;  Laterality: Left;  . Radioactive seed guided mastectomy with axillary sentinel lymph node biopsy Right 02/11/2016    Procedure: RIGHT MASTECTOMY WITH SENTINEL LYMPH NODE MAPPING AND RADIOACTIVE SEED GUIDED RIGHT NODE EXCISION;  Surgeon: Autumn Messing III, MD;  Location: [facility];  Service: General;  Laterality: Right;    There were no vitals filed for this visit.       Subjective Assessment - 03/17/16 1312    Subjective I am here because my doctor thinks I may need some therapy for my arm after my mastectomy.   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 No/denies   Pain Score 0-No pain            [facility] PT Assessment - 03/17/16 0001    Assessment   Medical Diagnosis right breast cancer   Referring Provider Marlou Starks   Onset Date/Surgical Date 02/11/16   Hand Dominance Right   Prior Therapy none   Precautions   Precautions Other (comment)  at risk for developing lymphedema   Restrictions   Weight Bearing Restrictions No   Balance Screen   Has the patient fallen in the past 6 months No   Has the patient had a decrease in activity level because of a fear of falling?  No   Is the patient reluctant to leave their home because of a fear of falling?  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   Leisure pt exercises 2-3x/wk for about 30 min   Cognition   Overall Cognitive Status Within Functional Limits for tasks assessed  AROM   Right Shoulder Flexion 168 Degrees   Right Shoulder ABduction 163 Degrees   Right Shoulder Internal Rotation 50 Degrees   Right Shoulder External Rotation 90 Degrees   Left Shoulder Flexion 175 Degrees   Left Shoulder ABduction 176 Degrees   Left Shoulder Internal Rotation 59 Degrees   Left Shoulder External Rotation 85 Degrees   Strength   Overall Strength Within functional limits for tasks performed           LYMPHEDEMA/ONCOLOGY QUESTIONNAIRE - 03/17/16 1317    Type   Cancer Type right breast cancer   Surgeries   Mastectomy Date 02/11/16   Saline Implant Reconstruction Date --  date not set yet -  not sure what type of reconstruction   Axillary Lymph Node Dissection Date 02/11/16   Number Lymph Nodes Removed 16  13-16 per pt report   Date Lymphedema/Swelling Started   Date 02/25/16  once drains were removed pt had a ceroma    Treatment   Active Chemotherapy Treatment No   Past Chemotherapy Treatment Yes   Date 01/01/16   Active Radiation Treatment No   Past Radiation Treatment No   Current Hormone Treatment Yes   Date 03/17/16   Drug Name Herceptin   Past Hormone Therapy No   What other symptoms do you have   Are you Having Heaviness or Tightness No   Are you having Pain No   Are you having pitting edema No   Is it Hard or Difficult finding clothes that fit No   Do you have infections No   Is there Decreased scar mobility No   Right Upper Extremity Lymphedema   15 cm Proximal to Olecranon Process 33 cm   Olecranon Process 25 cm   15 cm Proximal to Ulnar Styloid Process 25.9 cm   Just Proximal to Ulnar Styloid Process 15.5 cm   Across Hand at [employer] 17.6 cm   At [place] of 2nd Digit 6 cm   Left Upper Extremity Lymphedema   15 cm Proximal to Olecranon Process 33 cm   Olecranon Process 24.8 cm   15 cm Proximal to Ulnar Styloid Process 24.8 cm   Just Proximal to Ulnar Styloid Process 15 cm   Across Hand at [employer] 17.5 cm   At [place] of 2nd Digit 6 cm                [facility] Adult PT Treatment/Exercise - 03/17/16 0001    Manual Therapy   Manual Therapy Myofascial release   Manual therapy comments to right axilla where they is tighness possibly from cording/scar tissue   Myofascial Release to right axilla in area of tightness from cording/scar tissue                        Long Term Clinic Goals - 03/17/16 1401    CC Long Term Goal  #1   Title Pt will demonstrate 175 degrees of right shoulder abduction to allow her to return to PLOF   Baseline 163   Time 4   Period Weeks   Status New   CC Long Term Goal  #2   Title Pt will  demonstrate 175 degrees of right shoulder flexion to allow her to reach items overhead and return to PLOF   Baseline 168   Time 4   Period Weeks   Status New   CC Long Term Goal  #3   Title Pt to  be independent with a home exercise program for ROM and strengthening   Time 4   Period Weeks   Status New            Plan - 03/17/16 1356    Clinical Impression Statement Pt status post right mastectomy with 13-16 lymph nodes removed per pt report. Pt's right UE ROM is still slightly limited following her surgery. Her strength is grossly 5/5 within her current ROM. Pt would benefit from skilled PT services to address tightness in right axilla from cording and possible scar tissue and to progress pt towards independence with a home exercise program for continued strengthening.    Rehab Potential Excellent   Clinical Impairments Affecting Rehab Potential none   PT Frequency 2x / week   PT Duration 4 weeks   PT Treatment/Interventions Therapeutic exercise;Taping;Passive range of motion;Scar mobilization   PT Next Visit Plan begin with new HEP including rockwood exercises, rom exercises (wall washing) - myofascial to right axilla   Consulted and Agree with Plan of Care Patient      Patient will benefit from skilled therapeutic intervention in order to improve the following deficits and impairments:  Decreased range of motion, Decreased strength, Increased fascial restricitons  Visit Diagnosis: Muscle weakness (generalized) - Plan: PT plan of care cert/re-cert  Stiffness of right shoulder, not elsewhere classified - Plan: PT plan of care cert/re-cert     Problem List Patient Active Problem List   Diagnosis Date Noted  . Breast cancer, female, right 02/11/2016  . Genetic testing 01/25/2016  . 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 Family history of uterine cancer 01/17/2016  . Chemotherapy induced neutropenia ([place]) 11/20/2015  . Breast cancer of upper-outer quadrant of right female  breast ([place]) 09/05/2015    Alexia Freestone 03/17/2016, 2:06 PM  [place], [place], [address] Phone: [phone]   Fax:  [phone]  Name: Lori Dodson MRN: [id number] Date of Birth: [birth year]   Allyson Sabal, PT 03/17/2016 2:06 PM

## 2016-03-17 NOTE — Therapy (Signed)
[place], [place], [address] Phone: [phone]   Fax:  [phone]  Physical Therapy Evaluation  Patient Details  Name: Lori Dodson MRN: [id number] Date of Birth: 1986-08-28 Referring Provider: Marlou Starks  Encounter Date: 03/17/2016      PT End of Session - 03/17/16 1352    Visit Number 1   Number of Visits 8   Date for PT Re-Evaluation 04/14/16   PT Start Time 1300   PT Stop Time 1345   PT Time Calculation (min) 45 min   Activity Tolerance Patient tolerated treatment well   Behavior During Therapy [facility] for tasks assessed/performed      Past Medical History  Diagnosis Date  . Breast cancer ([place]) 09/2015    Right breast    Past Surgical History  Procedure Laterality Date  . Leep    . Portacath placement Left 09/13/2015    Procedure: ATTEMPTED INSERTION PORT-A-CATH;  Surgeon: Autumn Messing III, MD;  Location: [place];  Service: General;  Laterality: Left;  . Radioactive seed guided mastectomy with axillary sentinel lymph node biopsy Right 02/11/2016    Procedure: RIGHT MASTECTOMY WITH SENTINEL LYMPH NODE MAPPING AND RADIOACTIVE SEED GUIDED RIGHT NODE EXCISION;  Surgeon: Autumn Messing III, MD;  Location: [place];  Service: General;  Laterality: Right;    There were no vitals filed for this visit.       Subjective Assessment - 03/17/16 1312    Subjective I am here because my doctor thinks I may need some therapy for my arm after my mastectomy.   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 No/denies   Pain Score 0-No pain            [facility] PT Assessment - 03/17/16 0001    Assessment   Medical Diagnosis right breast cancer   Referring Provider Marlou Starks   Onset Date/Surgical Date 02/11/16   Hand Dominance Right   Prior Therapy none   Precautions   Precautions Other (comment)  at risk for developing lymphedema   Restrictions   Weight Bearing Restrictions No   Balance Screen   Has the patient fallen in the past 6 months No   Has the patient had a decrease in activity level because of a fear of falling?  No   Is the patient reluctant to leave their home because of a fear of falling?  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   Leisure pt exercises 2-3x/wk for about 30 min   Cognition   Overall Cognitive Status Within Functional Limits for tasks assessed  AROM   Right Shoulder Flexion 168 Degrees   Right Shoulder ABduction 163 Degrees   Right Shoulder Internal Rotation 50 Degrees   Right Shoulder External Rotation 90 Degrees   Left Shoulder Flexion 175 Degrees   Left Shoulder ABduction 176 Degrees   Left Shoulder Internal Rotation 59 Degrees   Left Shoulder External Rotation 85 Degrees   Strength   Overall Strength Within functional limits for tasks performed           LYMPHEDEMA/ONCOLOGY QUESTIONNAIRE - 03/17/16 1317    Type   Cancer Type right breast cancer   Surgeries   Mastectomy Date 02/11/16   Saline Implant Reconstruction Date --  date not set yet -  not sure what type of reconstruction   Axillary Lymph Node Dissection Date 02/11/16   Number Lymph Nodes Removed 16  13-16 per pt report   Date Lymphedema/Swelling Started   Date 02/25/16  once drains were removed pt had a ceroma    Treatment   Active Chemotherapy Treatment No   Past Chemotherapy Treatment Yes   Date 01/01/16   Active Radiation Treatment No   Past Radiation Treatment No   Current Hormone Treatment Yes   Date 03/17/16   Drug Name Herceptin   Past Hormone Therapy No   What other symptoms do you have   Are you Having Heaviness or Tightness No   Are you having Pain No   Are you having pitting edema No   Is it Hard or Difficult finding clothes that fit No   Do you have infections No   Is there Decreased scar mobility No   Right Upper Extremity Lymphedema   15 cm Proximal to Olecranon Process 33 cm   Olecranon Process 25 cm   15 cm Proximal to Ulnar Styloid Process 25.9 cm   Just Proximal to Ulnar Styloid Process 15.5 cm   Across Hand at [employer] 17.6 cm   At [place] of 2nd Digit 6 cm   Left Upper Extremity Lymphedema   15 cm Proximal to Olecranon Process 33 cm   Olecranon Process 24.8 cm   15 cm Proximal to Ulnar Styloid Process 24.8 cm   Just Proximal to Ulnar Styloid Process 15 cm   Across Hand at [employer] 17.5 cm   At [place] of 2nd Digit 6 cm           Quick Dash - 03/17/16 0001    Open a tight or new jar No difficulty   Do heavy household chores (wash walls, wash floors) No difficulty   Carry a shopping bag or briefcase No difficulty   Wash your back No difficulty   Use a knife to cut food No difficulty   Recreational activities in which you take some force or impact through your arm, shoulder, or hand (golf, hammering, tennis) No difficulty   During the past week, to what extent has your arm, shoulder or hand problem interfered with your normal social activities with family, friends, neighbors, or groups? Not at all   During the past week,  to what extent has your arm, shoulder or hand problem limited your work or other regular daily activities Not at all   Arm, shoulder, or hand pain. Mild   Tingling (pins and needles) in your arm, shoulder, or hand Mild   Difficulty Sleeping No difficulty   DASH Score 4.55 %             [facility] Adult PT Treatment/Exercise - 03/17/16  0001    Manual Therapy   Manual Therapy Myofascial release   Manual therapy comments to right axilla where they is tighness possibly from cording/scar tissue   Myofascial Release to right axilla in area of tightness from cording/scar tissue                        Long Term Clinic Goals - 03/17/16 1401    CC Long Term Goal  #1   Title Pt will demonstrate 175 degrees of right shoulder abduction to allow her to return to PLOF   Baseline 163   Time 4   Period Weeks   Status New   CC Long Term Goal  #2   Title Pt will demonstrate 175 degrees of right shoulder flexion to allow her to reach items overhead and return to PLOF   Baseline 168   Time 4   Period Weeks   Status New   CC Long Term Goal  #3   Title Pt to be independent with a home exercise program for ROM and strengthening   Time 4   Period Weeks   Status New            Plan - 03/17/16 1356    Clinical Impression Statement Pt status post right mastectomy with 13-16 lymph nodes removed per pt report. Pt's right UE ROM is still slightly limited following her surgery. Her strength is grossly 5/5 within her current ROM. Pt would benefit from skilled PT services to address tightness in right axilla from cording and possible scar tissue and to progress pt towards independence with a home exercise program for continued strengthening.    Rehab Potential Excellent   Clinical Impairments Affecting Rehab Potential none   PT Frequency 2x / week   PT Duration 4 weeks   PT Treatment/Interventions Therapeutic exercise;Taping;Passive range of motion;Scar mobilization   PT Next Visit Plan  begin with new HEP including rockwood exercises, rom exercises (wall washing) - myofascial to right axilla   Consulted and Agree with Plan of Care Patient      Patient will benefit from skilled therapeutic intervention in order to improve the following deficits and impairments:  Decreased range of motion, Decreased strength, Increased fascial restricitons  Visit Diagnosis: Muscle weakness (generalized) - Plan: PT plan of care cert/re-cert  Stiffness of right shoulder, not elsewhere classified - Plan: PT plan of care cert/re-cert     Problem List Patient Active Problem List   Diagnosis Date Noted  . Breast cancer, female, right 02/11/2016  . Genetic testing 01/25/2016  . 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 Family history of uterine cancer 01/17/2016  . Chemotherapy induced neutropenia ([place]) 11/20/2015  . Breast cancer of upper-outer quadrant of right female breast ([place]) 09/05/2015    Alexia Freestone 03/17/2016, 2:07 PM  [place], [place], [address] Phone: [phone]   Fax:  [phone]  Name: Lori Dodson MRN: [id number] Date of Birth: May 15, 1986   Allyson Sabal, PT 03/17/2016 2:07 PM

## 2016-03-18 ENCOUNTER — Ambulatory Visit: Payer: BLUE CROSS/BLUE SHIELD

## 2016-03-18 DIAGNOSIS — M6281 Muscle weakness (generalized): Secondary | ICD-10-CM

## 2016-03-18 DIAGNOSIS — M25611 Stiffness of right shoulder, not elsewhere classified: Secondary | ICD-10-CM

## 2016-03-18 NOTE — Patient Instructions (Signed)
Strengthening: Resisted Internal Rotation   Hold tubing in left hand, elbow at side and forearm out. Rotate forearm in across body. Repeat _10___ times per set. Do _2___ sets per session. Do _1-2___ sessions per day.  http://orth.exer.us/830   Copyright  VHI. All rights reserved.  Strengthening: Resisted External Rotation   Hold tubing in right hand, elbow at side and forearm across body. Rotate forearm out. Repeat _10___ times per set. Do _2___ sets per session. Do _1-2___ sessions per day.  http://orth.exer.us/828   Copyright  VHI. All rights reserved.  Strengthening: Resisted Flexion   Hold tubing with left arm at side. Pull forward and up. Move shoulder through pain-free range of motion. Repeat __10__ times per set. Do _2___ sets per session. Do _1-2___ sessions per day.  http://orth.exer.us/824   Copyright  VHI. All rights reserved.  Strengthening: Resisted Extension   Hold tubing in right hand, arm forward. Pull arm back, elbow straight. Repeat _10___ times per set. Do __2__ sets per session. Do _1-2___ sessions per day.  http://orth.exer.us/832   Copyright  VHI. All rights reserved.   In standing hold band out in front with arms straight and open arms above chest and bring back together slowly (horizontal abduction) 10 reps, 2 sets, 1-2 times/day.  In standing "pull sword out" from opposite hip and rotate to palm open with theraband. 10 reps, 2 sets, 1-2 times/day.

## 2016-03-18 NOTE — Therapy (Signed)
[place], [place], [address] Phone: [phone]   Fax:  [phone]  Physical Therapy Treatment  Patient Details  Name: Lori Dodson MRN: [id number] Date of Birth: May 01, 1986 Referring Provider: Marlou Starks  Encounter Date: 03/18/2016      PT End of Session - 03/18/16 1107    Visit Number 2   Number of Visits 8   Date for PT Re-Evaluation 04/14/16   PT Start Time 1024  Pt arrived late   PT Stop Time 1104   PT Time Calculation (min) 40 min   Activity Tolerance Patient tolerated treatment well   Behavior During Therapy [facility] for tasks assessed/performed      Past Medical History  Diagnosis Date  . Breast cancer ([place]) 09/2015    Right breast    Past Surgical History  Procedure Laterality Date  . Leep    . Portacath placement Left 09/13/2015    Procedure: ATTEMPTED INSERTION PORT-A-CATH;  Surgeon: Autumn Messing III, MD;  Location: [place];  Service: General;  Laterality: Left;  . Radioactive seed guided mastectomy with axillary sentinel lymph node biopsy Right 02/11/2016    Procedure: RIGHT MASTECTOMY WITH SENTINEL LYMPH NODE MAPPING AND RADIOACTIVE SEED GUIDED RIGHT NODE EXCISION;  Surgeon: Autumn Messing III, MD;  Location: [place];  Service: General;  Laterality: Right;    There were no vitals filed for this visit.      Subjective Assessment - 03/18/16 1026    Subjective Nothing new to report.   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  No/denies               LYMPHEDEMA/ONCOLOGY QUESTIONNAIRE - 03/17/16 1317    Type   Cancer Type right breast cancer   Surgeries   Mastectomy Date 02/11/16   Saline Implant Reconstruction Date --  date not set yet - not sure what type of reconstruction   Axillary Lymph Node Dissection Date 02/11/16   Number Lymph Nodes Removed 16  13-16 per pt report   Date Lymphedema/Swelling Started   Date 02/25/16  once drains were removed pt had a ceroma    Treatment   Active Chemotherapy Treatment No   Past Chemotherapy Treatment Yes   Date 01/01/16   Active Radiation Treatment No   Past Radiation Treatment No   Current Hormone Treatment Yes   Date 03/17/16   Drug Name Herceptin   Past Hormone Therapy No   What other symptoms do you have   Are you Having Heaviness or Tightness No   Are you having Pain No   Are you having pitting edema No   Is it Hard or Difficult finding clothes that fit No   Do you have infections No   Is there Decreased scar mobility No   Right Upper Extremity Lymphedema   15 cm Proximal to Olecranon Process 33 cm   Olecranon Process 25 cm   15 cm Proximal to Ulnar Styloid Process 25.9 cm   Just Proximal to Ulnar Styloid Process 15.5 cm   Across Hand at [employer]  17.6 cm   At [facility] of 2nd Digit 6 cm   Left Upper Extremity Lymphedema   15 cm Proximal to Olecranon Process 33 cm   Olecranon Process 24.8 cm   15 cm Proximal to Ulnar Styloid Process 24.8 cm   Just Proximal to Ulnar Styloid Process 15 cm   Across Hand at [employer] 17.5 cm   At [place] of 2nd Digit 6 cm                  [facility] Adult PT Treatment/Exercise - 03/18/16 0001    Self-Care   Other Self-Care Comments  Instructed pt in lymphedema risk reduction practices and infection prevention and including to limit how often she does UE strengthening exercises about 3x/week and not on days she feels she has moverworked/overused her arm. Pt verbalized understanding all.    Shoulder  Exercises: Standing   Horizontal ABduction Strengthening;Both;10 reps;Theraband   Theraband Level (Shoulder Horizontal ABduction) Level 2 (Red)   External Rotation Strengthening;Right;10 reps;Theraband   Theraband Level (Shoulder External Rotation) Level 2 (Red)   Internal Rotation Strengthening;Right;10 reps;Theraband   Theraband Level (Shoulder Internal Rotation) Level 2 (Red)   Flexion Strengthening;Right;10 reps;Theraband   Theraband Level (Shoulder Flexion) Level 2 (Red)   Extension Strengthening;Right;10 reps;Theraband   Theraband Level (Shoulder Extension) Level 2 (Red)   Extension Limitations Demo and verbal cuing for correct technique for correct scapular engagment and no trunk rotation   Other Standing Exercises Bil D2 with red theraband 10 reps each side   Manual Therapy   Manual Therapy Myofascial release   Myofascial Release to right axilla with UE in end of D2 position at area of tightness with cross hands technique diagonally from axilla to incision                PT Education - 03/18/16 1055    Education provided Yes   Education Details [place] and scapular strengthening exercises, also issued green theraband for pt to progress to when able   Person(s) Educated Patient   Methods Explanation;Demonstration;Handout   Comprehension Verbalized understanding;Returned demonstration                Long Term Clinic Goals - 03/17/16 1401    CC Long Term Goal  #1   Title Pt will demonstrate 175 degrees of right shoulder abduction to allow her to return to PLOF   Baseline 163   Time 4   Period Weeks   Status New   CC Long Term Goal  #2   Title Pt will demonstrate 175 degrees of right shoulder flexion to allow her to reach items overhead and return to PLOF   Baseline 168   Time 4   Period Weeks   Status New   CC Long Term Goal  #3   Title Pt to be independent with a home exercise program for ROM and strengthening   Time 4   Period Weeks   Status New             Plan - 03/18/16 1208    Clinical Impression Statement Pt did well with new exercises issued today performing with good technique and no increased pain. Pt also did well with manual therapy reporting feeling looser after stretching today. She wants to cont for at least 1 more week to finalize a HEP for strength and cont to work on axillary tightness, though cording was not palpable today.    Rehab Potential Excellent   Clinical Impairments Affecting Rehab  Potential none   PT Frequency 2x / week   PT Duration 4 weeks   PT Treatment/Interventions Therapeutic exercise;Taping;Passive range of motion;Scar mobilization   PT Next Visit Plan Review HEP prn and progress, possibly to Strength ABC Program?? Cont myofascial release to Rt axilla and incision. Asses status of compression sleeve, does pt need help with this?   PT Home Exercise Plan see education section   Recommended Other Services Pt to look into getting a compression sleeve from a medical company in [place], [place] where she lives.    Consulted and Agree with Plan of Care Patient      Patient will benefit from skilled therapeutic intervention in order to improve the following deficits and impairments:  Decreased range of motion, Decreased strength, Increased fascial restricitons  Visit Diagnosis: Muscle weakness (generalized)  Stiffness of right shoulder, not elsewhere classified     Problem List Patient Active Problem List   Diagnosis Date Noted  . Breast cancer, female, right 02/11/2016  . Genetic testing 01/25/2016  . 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 Family history of uterine cancer 01/17/2016  . Chemotherapy induced neutropenia ([place]) 11/20/2015  . Breast cancer of upper-outer quadrant of right female breast ([place]) 09/05/2015    Otelia Limes, PTA 03/18/2016, 12:19 PM  [place], [place], [address] Phone: [phone]    Fax:  [phone]  Name: Lori Dodson MRN: [id number] Date of Birth: [birth year]

## 2016-03-24 ENCOUNTER — Encounter: Payer: Self-pay | Admitting: Hematology and Oncology

## 2016-03-24 ENCOUNTER — Ambulatory Visit: Payer: BLUE CROSS/BLUE SHIELD | Admitting: Physical Therapy

## 2016-03-24 ENCOUNTER — Ambulatory Visit (HOSPITAL_BASED_OUTPATIENT_CLINIC_OR_DEPARTMENT_OTHER): Payer: BLUE CROSS/BLUE SHIELD | Admitting: Hematology and Oncology

## 2016-03-24 ENCOUNTER — Encounter: Payer: Self-pay | Admitting: Physical Therapy

## 2016-03-24 ENCOUNTER — Other Ambulatory Visit (HOSPITAL_BASED_OUTPATIENT_CLINIC_OR_DEPARTMENT_OTHER): Payer: BLUE CROSS/BLUE SHIELD

## 2016-03-24 ENCOUNTER — Ambulatory Visit (HOSPITAL_BASED_OUTPATIENT_CLINIC_OR_DEPARTMENT_OTHER): Payer: BLUE CROSS/BLUE SHIELD

## 2016-03-24 VITALS — BP 103/65 | HR 65 | Temp 97.9°F | Resp 18 | Ht 63.0 in | Wt 165.4 lb

## 2016-03-24 DIAGNOSIS — C50411 Malignant neoplasm of upper-outer quadrant of right female breast: Secondary | ICD-10-CM

## 2016-03-24 DIAGNOSIS — Z5112 Encounter for antineoplastic immunotherapy: Secondary | ICD-10-CM | POA: Diagnosis not present

## 2016-03-24 DIAGNOSIS — M6281 Muscle weakness (generalized): Secondary | ICD-10-CM | POA: Diagnosis not present

## 2016-03-24 DIAGNOSIS — M25611 Stiffness of right shoulder, not elsewhere classified: Secondary | ICD-10-CM

## 2016-03-24 LAB — CBC WITH DIFFERENTIAL/PLATELET
BASO%: 0.4 % (ref 0.0–2.0)
Basophils Absolute: 0 10*3/uL (ref 0.0–0.1)
EOS ABS: 0.1 10*3/uL (ref 0.0–0.5)
EOS%: 2.1 % (ref 0.0–7.0)
HCT: 37.9 % (ref 34.8–46.6)
HEMOGLOBIN: 12.7 g/dL (ref 11.6–15.9)
LYMPH%: 47 % (ref 14.0–49.7)
MCH: 30.3 pg (ref 25.1–34.0)
MCHC: 33.5 g/dL (ref 31.5–36.0)
MCV: 90.4 fL (ref 79.5–101.0)
MONO#: 0.3 10*3/uL (ref 0.1–0.9)
MONO%: 5.6 % (ref 0.0–14.0)
NEUT%: 44.9 % (ref 38.4–76.8)
NEUTROS ABS: 2.8 10*3/uL (ref 1.5–6.5)
Platelets: 231 10*3/uL (ref 145–400)
RBC: 4.19 10*6/uL (ref 3.70–5.45)
RDW: 12.4 % (ref 11.2–14.5)
WBC: 6.2 10*3/uL (ref 3.9–10.3)
lymph#: 2.9 10*3/uL (ref 0.9–3.3)

## 2016-03-24 LAB — COMPREHENSIVE METABOLIC PANEL
ALBUMIN: 3.8 g/dL (ref 3.5–5.0)
ALK PHOS: 59 U/L (ref 40–150)
ALT: 16 U/L (ref 0–55)
AST: 15 U/L (ref 5–34)
Anion Gap: 7 mEq/L (ref 3–11)
BILIRUBIN TOTAL: 0.77 mg/dL (ref 0.20–1.20)
BUN: 16.9 mg/dL (ref 7.0–26.0)
CO2: 26 meq/L (ref 22–29)
CREATININE: 0.9 mg/dL (ref 0.6–1.1)
Calcium: 9.5 mg/dL (ref 8.4–10.4)
Chloride: 108 mEq/L (ref 98–109)
GLUCOSE: 108 mg/dL (ref 70–140)
Potassium: 4 mEq/L (ref 3.5–5.1)
SODIUM: 141 meq/L (ref 136–145)
TOTAL PROTEIN: 6.9 g/dL (ref 6.4–8.3)

## 2016-03-24 MED ORDER — SODIUM CHLORIDE 0.9 % IV SOLN
Freq: Once | INTRAVENOUS | Status: AC
  Administered 2016-03-24: 11:00:00 via INTRAVENOUS

## 2016-03-24 MED ORDER — TRASTUZUMAB CHEMO INJECTION 440 MG
6.0000 mg/kg | Freq: Once | INTRAVENOUS | Status: AC
  Administered 2016-03-24: 462 mg via INTRAVENOUS
  Filled 2016-03-24: qty 22

## 2016-03-24 MED ORDER — SODIUM CHLORIDE 0.9% FLUSH
10.0000 mL | INTRAVENOUS | Status: DC | PRN
  Administered 2016-03-24: 10 mL
  Filled 2016-03-24: qty 10

## 2016-03-24 MED ORDER — ACETAMINOPHEN 325 MG PO TABS
650.0000 mg | ORAL_TABLET | Freq: Once | ORAL | Status: AC
  Administered 2016-03-24: 650 mg via ORAL

## 2016-03-24 MED ORDER — ACETAMINOPHEN 325 MG PO TABS
ORAL_TABLET | ORAL | Status: AC
  Filled 2016-03-24: qty 2

## 2016-03-24 MED ORDER — HEPARIN SOD (PORK) LOCK FLUSH 100 UNIT/ML IV SOLN
500.0000 [IU] | Freq: Once | INTRAVENOUS | Status: AC | PRN
  Administered 2016-03-24: 500 [IU]
  Filled 2016-03-24: qty 5

## 2016-03-24 NOTE — Progress Notes (Signed)
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    CHIEF COMPLIANT: Herceptin maintenance therapy  INTERVAL HISTORY: Lori Dodson is a 30 year old with above-mentioned history of right breast cancer who underwent neoadjuvant chemotherapy and had a complete response. She underwent right mastectomy on 02/11/2016. She is currently on Herceptin maintenance. She has had no major trouble from chemotherapy are Herceptin. She reports that her energy levels and strength are back to her normal. These postsurgical pain has resolved. She does not take any more narcotic pain medications.  REVIEW OF SYSTEMS:   Constitutional: Denies fevers, chills or abnormal weight loss Eyes: Denies blurriness of vision Ears, nose, mouth, throat, and face: Denies mucositis or sore throat Respiratory: Denies cough, dyspnea or wheezes Cardiovascular: Denies palpitation, chest discomfort Gastrointestinal:  Denies nausea, heartburn or change in bowel  habits Skin: Denies abnormal skin rashes Lymphatics: Denies new lymphadenopathy or easy bruising Neurological:Denies numbness, tingling or new weaknesses Behavioral/Psych: Mood is stable, no new changes  Extremities: No lower extremity edema Breast: Right mastectomy All other systems were reviewed with the patient and are negative.  I have reviewed the past medical history, past surgical history, social history and family history with the patient and they are unchanged from previous note.  ALLERGIES:  is allergic to latex.  MEDICATIONS:  Current Outpatient Prescriptions  Medication Sig Dispense Refill  . lidocaine-prilocaine (EMLA) cream Apply to affected area once 30 g 3  . tamoxifen (NOLVADEX) 20 MG tablet Take 1 tablet (20 mg total) by mouth daily. 90 tablet 3   No current facility-administered medications for this visit.    PHYSICAL EXAMINATION: ECOG PERFORMANCE STATUS: 0 - Asymptomatic  Filed Vitals:   03/24/16 0930  BP: 103/65  Pulse: 65  Temp: 97.9 F (36.6 C)  Resp: 18   Filed Weights   03/24/16 0930  Weight: 165 lb 6.4 oz (75.025 kg)    GENERAL:alert, no distress and comfortable SKIN: skin color, texture, turgor are normal, no rashes or significant lesions EYES: normal, Conjunctiva are pink and non-injected, sclera clear OROPHARYNX:no exudate, no erythema and lips, buccal mucosa, and tongue normal  NECK: supple, thyroid normal size, non-tender, without nodularity LYMPH:  no palpable lymphadenopathy in the cervical, axillary or inguinal LUNGS: clear to auscultation and percussion with normal breathing effort HEART: regular rate & rhythm and no murmurs and no lower extremity edema ABDOMEN:abdomen soft, non-tender and normal bowel sounds MUSCULOSKELETAL:no cyanosis of digits and no clubbing  NEURO: alert & oriented x 3 with fluent speech, no focal motor/sensory deficits EXTREMITIES: No lower extremity edema BREAST: Right breast scar appears  to be healing very  well. No palpable axillary supraclavicular or infraclavicular adenopathy no breast tenderness or nipple discharge. (exam performed in the presence of a chaperone)  LABORATORY DATA:  I have reviewed the data as listed   Chemistry      Component Value Date/Time   NA 141 03/24/2016 0914   NA 142 02/05/2016 1032   K 4.0 03/24/2016 0914   K 4.2 02/05/2016 1032   CL 109 02/05/2016 1032   CO2 26 03/24/2016 0914   CO2 23 02/05/2016 1032   BUN 16.9 03/24/2016 0914   BUN 10 02/05/2016 1032   CREATININE 0.9 03/24/2016 0914   CREATININE 0.96 02/05/2016 1032      Component Value Date/Time   CALCIUM 9.5 03/24/2016 0914   CALCIUM 9.5 02/05/2016 1032   ALKPHOS 59 03/24/2016 0914   AST 15 03/24/2016 0914   ALT 16 03/24/2016 0914   BILITOT 0.77 03/24/2016 0914     Lab Results  Component Value Date   WBC 6.2 03/24/2016   HGB 12.7 03/24/2016   HCT 37.9 03/24/2016   MCV 90.4 03/24/2016   PLT 231 03/24/2016   NEUTROABS 2.8 03/24/2016   ASSESSMENT & PLAN:  Breast cancer of upper-outer quadrant of right female breast ([place]) 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 No role of XRT 2. Adjuvant Anti estrogen therapy with Tamoxifen 20 mg daily X 10 years 3. Continue with Herceptin maintenance every 3 weeks.  Severe postsurgical pain: Resolved  Return to clinic every 3 weeks with Herceptin and received 6 weeks for follow-up   Orders Placed This Encounter  Procedures  . Ambulatory referral to Plastic  Surgery    Referral Priority:  Routine    Referral Type:  Surgical    Referral Reason:  Specialty Services Required    Requested Specialty:  Plastic Surgery    Number of Visits Requested:  1   The patient has a good understanding of the overall plan. she agrees with it. she will call with any problems that may develop before the next visit here.   Rulon Eisenmenger, MD 03/24/2016

## 2016-03-24 NOTE — Therapy (Signed)
[place], [place], [address] Phone: [phone]   Fax:  [phone]  Physical Therapy Treatment  Patient Details  Name: Lori Dodson MRN: [id number] Date of Birth: 09-18-1986 Referring Provider: Marlou Starks  Encounter Date: 03/24/2016      PT End of Session - 03/24/16 0901    Visit Number 3   Number of Visits 8   Date for PT Re-Evaluation 04/14/16   PT Start Time 0803   PT Stop Time 0845   PT Time Calculation (min) 42 min   Activity Tolerance Patient tolerated treatment well   Behavior During Therapy [facility] for tasks assessed/performed      Past Medical History  Diagnosis Date  . Breast cancer ([place]) 09/2015    Right breast    Past Surgical History  Procedure Laterality Date  . [place]    . Portacath placement Left 09/13/2015    Procedure: ATTEMPTED INSERTION PORT-A-CATH;  Surgeon: Autumn Messing III, MD;  Location: [place];  Service: General;  Laterality: Left;  . Radioactive seed guided mastectomy with axillary sentinel lymph node biopsy Right 02/11/2016    Procedure: RIGHT MASTECTOMY WITH SENTINEL LYMPH NODE MAPPING AND RADIOACTIVE SEED GUIDED RIGHT NODE EXCISION;  Surgeon: Autumn Messing III, MD;  Location: [place];  Service: General;  Laterality: Right;    There were no vitals filed for this visit.      Subjective Assessment - 03/24/16 0808    Subjective Pt has been compliant with home exercise program. Pt stated her arm did feel heavier but she states that happens sometimes. It is not anything new.   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 No/denies   Pain Score 0-No pain            [facility] PT Assessment - 03/24/16 0001    AROM   Right Shoulder Internal Rotation 75 Degrees                     [facility] Adult PT Treatment/Exercise - 03/24/16 0001    Shoulder Exercises: Standing   External Rotation Strengthening;10 reps;Theraband;Both   Theraband Level (Shoulder External Rotation) Level 3 (Green)   Internal Rotation Strengthening;10 reps;Theraband;Both   Theraband Level (Shoulder Internal Rotation) Level 3 (Green)   Internal Rotation Limitations cues for correct form   Flexion Strengthening;10 reps;Theraband;Both   Theraband Level (Shoulder Flexion) Level 3 (Green)   ABduction Strengthening;Both;10 reps;Theraband   Theraband Level (Shoulder ABduction) Level 3 (Green)   Extension Strengthening;Both;10 reps;Theraband   Theraband Level (Shoulder Extension) Level 3 (Green)   Extension Limitations verbal cuing for correct technique for correct scapular engagment and no trunk rotation   Row Strengthening;Both;Theraband;10 reps   Theraband Level (Shoulder Row) Level 3 (Green)   Other Standing Exercises Bil D2 with green theraband 10 reps each side   Other Standing Exercises --   Shoulder Exercises: Therapy Ball   Flexion 10 reps  with stretch at end range   ABduction 10 reps  with stretch at end range   Manual Therapy   Manual Therapy Myofascial release  Myofascial Release to right axilla with UE in end of D2 position at area of tightness                        Long Term Clinic Goals - 03/24/16 6962    CC Long Term Goal  #1   Title Pt will demonstrate 175 degrees of right shoulder abduction to allow her to return to PLOF   Baseline 163, 03/24/16- 179 degrees   Status Achieved   CC Long Term Goal  #2   Title Pt will demonstrate 175 degrees of right shoulder flexion to allow her to  reach items overhead and return to PLOF   Baseline 168, 03/24/16- 175 degrees   Status Achieved   CC Long Term Goal  #3   Title Pt to be independent with a home exercise program for ROM and strengthening   Status Partially Met            Plan - 03/24/16 0901    Clinical Impression Statement Pt has made excellent progress in therapy. She has met both ROM goals. She has been compliant with her home exercise program and the strength of the theraband was increased from red to green. She continues to have tightness in right axilla but tolerates myofascial well without increased pain or tenderness. Pt is progressing towards discharge and will be ready for d/c once she is independent with a home exercise program.    Rehab Potential Excellent   Clinical Impairments Affecting Rehab Potential none   PT Frequency 2x / week   PT Duration 4 weeks   PT Treatment/Interventions Therapeutic exercise;Taping;Passive range of motion;Scar mobilization   PT Next Visit Plan assess for indep with rockwood strengthening exercises, myofascial to R axilla, asses status of compression sleeve - does pt need assist in ordering - she was going to look in to getting one in [place], possible d/c?   Consulted and Agree with Plan of Care Patient      Patient will benefit from skilled therapeutic intervention in order to improve the following deficits and impairments:  Decreased range of motion, Decreased strength, Increased fascial restricitons  Visit Diagnosis: Muscle weakness (generalized)  Stiffness of right shoulder, not elsewhere classified     Problem List Patient Active Problem List   Diagnosis Date Noted  . Genetic testing 01/25/2016  . 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 Family history of uterine cancer 01/17/2016  . Breast cancer of upper-outer quadrant of right female breast ([facility]) 09/05/2015    Alexia Freestone 03/24/2016, [place], [place], [address] Phone: [phone]   Fax:  [phone]  Name: Lori Dodson MRN: [id number] Date of Birth: 06/11/1986    Allyson Sabal, PT 03/24/2016 9:05 AM

## 2016-03-24 NOTE — Patient Instructions (Signed)
[facility] Discharge Instructions for Patients Receiving Chemotherapy  Today you received the following chemotherapy agents:  Herceptin  To help prevent nausea and vomiting after your treatment, we encourage you to take your nausea medication as prescribed.   If you develop nausea and vomiting that is not controlled by your nausea medication, call the clinic.   BELOW ARE SYMPTOMS THAT SHOULD BE REPORTED IMMEDIATELY:  *FEVER GREATER THAN 100.5 F  *CHILLS WITH OR WITHOUT FEVER  NAUSEA AND VOMITING THAT IS NOT CONTROLLED WITH YOUR NAUSEA MEDICATION  *UNUSUAL SHORTNESS OF BREATH  *UNUSUAL BRUISING OR BLEEDING  TENDERNESS IN MOUTH AND THROAT WITH OR WITHOUT PRESENCE OF ULCERS  *URINARY PROBLEMS  *BOWEL PROBLEMS  UNUSUAL RASH Items with * indicate a potential emergency and should be followed up as soon as possible.  Feel free to call the clinic you have any questions or concerns. The clinic phone number is [phone].  Please show the CHEMO ALERT CARD at check-in to the Emergency Department and triage nurse.   

## 2016-03-24 NOTE — Assessment & Plan Note (Signed)
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 No role of XRT 2. Adjuvant Anti estrogen therapy with Tamoxifen 20 mg daily X 10 years 3. Continue with Herceptin maintenance every 3 weeks.  Severe postsurgical pain:   Return to clinic every 3 weeks with Herceptin and received 6 weeks for follow-up 

## 2016-03-25 ENCOUNTER — Other Ambulatory Visit: Payer: BLUE CROSS/BLUE SHIELD

## 2016-03-25 ENCOUNTER — Ambulatory Visit: Payer: BLUE CROSS/BLUE SHIELD | Admitting: Hematology and Oncology

## 2016-03-25 ENCOUNTER — Ambulatory Visit: Payer: BLUE CROSS/BLUE SHIELD

## 2016-03-26 ENCOUNTER — Telehealth: Payer: Self-pay | Admitting: Hematology and Oncology

## 2016-03-26 NOTE — Telephone Encounter (Signed)
Spoke with patient to confirm appt with Dr Harlow Mares plastic surgery for 5/15 at 3 pm.

## 2016-03-27 ENCOUNTER — Encounter: Payer: BLUE CROSS/BLUE SHIELD | Admitting: Physical Therapy

## 2016-03-31 ENCOUNTER — Ambulatory Visit: Payer: BLUE CROSS/BLUE SHIELD | Admitting: Physical Therapy

## 2016-03-31 ENCOUNTER — Other Ambulatory Visit: Payer: Self-pay

## 2016-04-03 ENCOUNTER — Ambulatory Visit: Payer: BLUE CROSS/BLUE SHIELD | Admitting: Physical Therapy

## 2016-04-07 ENCOUNTER — Telehealth: Payer: Self-pay

## 2016-04-07 ENCOUNTER — Telehealth (HOSPITAL_COMMUNITY): Payer: Self-pay | Admitting: Vascular Surgery

## 2016-04-07 ENCOUNTER — Ambulatory Visit: Payer: BLUE CROSS/BLUE SHIELD | Admitting: Physical Therapy

## 2016-04-07 NOTE — Telephone Encounter (Signed)
Left pt message to make a new pt appt  W/ ECHO

## 2016-04-07 NOTE — Telephone Encounter (Signed)
Spoke with pt to inform her she would be receiving a call to schedule her next ECHO as it is overdue.  Pt scheduled for next treatment 04/14/16.  Last ECHO 12/18/15.  Pt verbalized understanding and no further questions at time of call.

## 2016-04-11 NOTE — Progress Notes (Signed)
Received request from [facility] with Dr. Alvira Philips office for pt's records to be faxed over for her appointment on Monday 5/15.  Records were faxed and Lori Dodson notified.

## 2016-04-11 NOTE — Progress Notes (Signed)
Patient scheduled for treatment on 04/14/16.  Pt last ECHO done on 12/17/13.  Pt scheduled for ECHO on 04/25/16.  Pharmacy aware and pt okay to treat if asymptomatic.

## 2016-04-14 ENCOUNTER — Ambulatory Visit (HOSPITAL_BASED_OUTPATIENT_CLINIC_OR_DEPARTMENT_OTHER): Payer: BLUE CROSS/BLUE SHIELD

## 2016-04-14 ENCOUNTER — Other Ambulatory Visit (HOSPITAL_BASED_OUTPATIENT_CLINIC_OR_DEPARTMENT_OTHER): Payer: BLUE CROSS/BLUE SHIELD

## 2016-04-14 DIAGNOSIS — C50411 Malignant neoplasm of upper-outer quadrant of right female breast: Secondary | ICD-10-CM | POA: Diagnosis not present

## 2016-04-14 DIAGNOSIS — Z5112 Encounter for antineoplastic immunotherapy: Secondary | ICD-10-CM | POA: Diagnosis not present

## 2016-04-14 LAB — CBC WITH DIFFERENTIAL/PLATELET
BASO%: 0.3 % (ref 0.0–2.0)
BASOS ABS: 0 10*3/uL (ref 0.0–0.1)
EOS ABS: 0.1 10*3/uL (ref 0.0–0.5)
EOS%: 1.6 % (ref 0.0–7.0)
HCT: 35 % (ref 34.8–46.6)
HGB: 12.4 g/dL (ref 11.6–15.9)
LYMPH%: 56.2 % — AB (ref 14.0–49.7)
MCH: 30.5 pg (ref 25.1–34.0)
MCHC: 35.4 g/dL (ref 31.5–36.0)
MCV: 86.2 fL (ref 79.5–101.0)
MONO#: 0.3 10*3/uL (ref 0.1–0.9)
MONO%: 4.8 % (ref 0.0–14.0)
NEUT%: 37.1 % — AB (ref 38.4–76.8)
NEUTROS ABS: 2.5 10*3/uL (ref 1.5–6.5)
PLATELETS: 201 10*3/uL (ref 145–400)
RBC: 4.06 10*6/uL (ref 3.70–5.45)
RDW: 12.3 % (ref 11.2–14.5)
WBC: 6.7 10*3/uL (ref 3.9–10.3)
lymph#: 3.8 10*3/uL — ABNORMAL HIGH (ref 0.9–3.3)

## 2016-04-14 LAB — COMPREHENSIVE METABOLIC PANEL
ALK PHOS: 49 U/L (ref 40–150)
ALT: 11 U/L (ref 0–55)
ANION GAP: 6 meq/L (ref 3–11)
AST: 12 U/L (ref 5–34)
Albumin: 3.5 g/dL (ref 3.5–5.0)
BILIRUBIN TOTAL: 0.79 mg/dL (ref 0.20–1.20)
BUN: 16.9 mg/dL (ref 7.0–26.0)
CO2: 25 meq/L (ref 22–29)
Calcium: 9.2 mg/dL (ref 8.4–10.4)
Chloride: 109 mEq/L (ref 98–109)
Creatinine: 0.9 mg/dL (ref 0.6–1.1)
Glucose: 111 mg/dl (ref 70–140)
POTASSIUM: 4.1 meq/L (ref 3.5–5.1)
Sodium: 140 mEq/L (ref 136–145)
TOTAL PROTEIN: 6.6 g/dL (ref 6.4–8.3)

## 2016-04-14 MED ORDER — SODIUM CHLORIDE 0.9 % IV SOLN
Freq: Once | INTRAVENOUS | Status: AC
  Administered 2016-04-14: 12:00:00 via INTRAVENOUS

## 2016-04-14 MED ORDER — ACETAMINOPHEN 325 MG PO TABS
ORAL_TABLET | ORAL | Status: AC
  Filled 2016-04-14: qty 2

## 2016-04-14 MED ORDER — ACETAMINOPHEN 325 MG PO TABS
650.0000 mg | ORAL_TABLET | Freq: Once | ORAL | Status: AC
  Administered 2016-04-14: 650 mg via ORAL

## 2016-04-14 MED ORDER — HEPARIN SOD (PORK) LOCK FLUSH 100 UNIT/ML IV SOLN
500.0000 [IU] | Freq: Once | INTRAVENOUS | Status: DC | PRN
  Filled 2016-04-14: qty 5

## 2016-04-14 MED ORDER — TRASTUZUMAB CHEMO INJECTION 440 MG
6.0000 mg/kg | Freq: Once | INTRAVENOUS | Status: AC
  Administered 2016-04-14: 462 mg via INTRAVENOUS
  Filled 2016-04-14: qty 22

## 2016-04-14 MED ORDER — SODIUM CHLORIDE 0.9% FLUSH
10.0000 mL | INTRAVENOUS | Status: DC | PRN
  Filled 2016-04-14: qty 10

## 2016-04-14 NOTE — Patient Instructions (Signed)
[facility] Discharge Instructions for Patients Receiving Chemotherapy  Today you received the following chemotherapy agents:  Herceptin  To help prevent nausea and vomiting after your treatment, we encourage you to take your nausea medication as prescribed.   If you develop nausea and vomiting that is not controlled by your nausea medication, call the clinic.   BELOW ARE SYMPTOMS THAT SHOULD BE REPORTED IMMEDIATELY:  *FEVER GREATER THAN 100.5 F  *CHILLS WITH OR WITHOUT FEVER  NAUSEA AND VOMITING THAT IS NOT CONTROLLED WITH YOUR NAUSEA MEDICATION  *UNUSUAL SHORTNESS OF BREATH  *UNUSUAL BRUISING OR BLEEDING  TENDERNESS IN MOUTH AND THROAT WITH OR WITHOUT PRESENCE OF ULCERS  *URINARY PROBLEMS  *BOWEL PROBLEMS  UNUSUAL RASH Items with * indicate a potential emergency and should be followed up as soon as possible.  Feel free to call the clinic you have any questions or concerns. The clinic phone number is [phone].  Please show the CHEMO ALERT CARD at check-in to the Emergency Department and triage nurse.   

## 2016-04-15 ENCOUNTER — Other Ambulatory Visit: Payer: BLUE CROSS/BLUE SHIELD

## 2016-04-15 ENCOUNTER — Ambulatory Visit: Payer: BLUE CROSS/BLUE SHIELD

## 2016-04-25 ENCOUNTER — Inpatient Hospital Stay (HOSPITAL_COMMUNITY): Admission: RE | Admit: 2016-04-25 | Payer: BLUE CROSS/BLUE SHIELD | Source: Ambulatory Visit

## 2016-04-25 ENCOUNTER — Ambulatory Visit (HOSPITAL_COMMUNITY): Admission: RE | Admit: 2016-04-25 | Payer: BLUE CROSS/BLUE SHIELD | Source: Ambulatory Visit

## 2016-04-25 ENCOUNTER — Other Ambulatory Visit (HOSPITAL_COMMUNITY): Payer: Self-pay | Admitting: Cardiology

## 2016-04-25 DIAGNOSIS — C50011 Malignant neoplasm of nipple and areola, right female breast: Secondary | ICD-10-CM

## 2016-05-06 ENCOUNTER — Telehealth: Payer: Self-pay | Admitting: Hematology and Oncology

## 2016-05-06 ENCOUNTER — Ambulatory Visit (HOSPITAL_BASED_OUTPATIENT_CLINIC_OR_DEPARTMENT_OTHER): Payer: BLUE CROSS/BLUE SHIELD

## 2016-05-06 ENCOUNTER — Ambulatory Visit (HOSPITAL_BASED_OUTPATIENT_CLINIC_OR_DEPARTMENT_OTHER): Payer: BLUE CROSS/BLUE SHIELD | Admitting: Hematology and Oncology

## 2016-05-06 ENCOUNTER — Encounter: Payer: Self-pay | Admitting: Hematology and Oncology

## 2016-05-06 ENCOUNTER — Other Ambulatory Visit (HOSPITAL_BASED_OUTPATIENT_CLINIC_OR_DEPARTMENT_OTHER): Payer: BLUE CROSS/BLUE SHIELD

## 2016-05-06 VITALS — BP 111/71 | HR 64 | Temp 98.6°F | Resp 18 | Ht 63.0 in | Wt 167.7 lb

## 2016-05-06 DIAGNOSIS — C50411 Malignant neoplasm of upper-outer quadrant of right female breast: Secondary | ICD-10-CM | POA: Diagnosis not present

## 2016-05-06 DIAGNOSIS — Z5112 Encounter for antineoplastic immunotherapy: Secondary | ICD-10-CM | POA: Diagnosis not present

## 2016-05-06 LAB — CBC WITH DIFFERENTIAL/PLATELET
BASO%: 0.4 % (ref 0.0–2.0)
BASOS ABS: 0 10*3/uL (ref 0.0–0.1)
EOS%: 2.6 % (ref 0.0–7.0)
Eosinophils Absolute: 0.2 10*3/uL (ref 0.0–0.5)
HCT: 36.8 % (ref 34.8–46.6)
HEMOGLOBIN: 12.9 g/dL (ref 11.6–15.9)
LYMPH%: 54.5 % — ABNORMAL HIGH (ref 14.0–49.7)
MCH: 29.9 pg (ref 25.1–34.0)
MCHC: 35.1 g/dL (ref 31.5–36.0)
MCV: 85.4 fL (ref 79.5–101.0)
MONO#: 0.4 10*3/uL (ref 0.1–0.9)
MONO%: 5.2 % (ref 0.0–14.0)
NEUT#: 2.6 10*3/uL (ref 1.5–6.5)
NEUT%: 37.3 % — ABNORMAL LOW (ref 38.4–76.8)
Platelets: 229 10*3/uL (ref 145–400)
RBC: 4.31 10*6/uL (ref 3.70–5.45)
RDW: 12.4 % (ref 11.2–14.5)
WBC: 6.9 10*3/uL (ref 3.9–10.3)
lymph#: 3.7 10*3/uL — ABNORMAL HIGH (ref 0.9–3.3)

## 2016-05-06 LAB — COMPREHENSIVE METABOLIC PANEL
ALBUMIN: 3.7 g/dL (ref 3.5–5.0)
ALK PHOS: 55 U/L (ref 40–150)
ALT: 10 U/L (ref 0–55)
AST: 12 U/L (ref 5–34)
Anion Gap: 7 mEq/L (ref 3–11)
BUN: 12.8 mg/dL (ref 7.0–26.0)
CHLORIDE: 109 meq/L (ref 98–109)
CO2: 25 mEq/L (ref 22–29)
Calcium: 9.2 mg/dL (ref 8.4–10.4)
Creatinine: 1 mg/dL (ref 0.6–1.1)
EGFR: 88 mL/min/{1.73_m2} — ABNORMAL LOW (ref >90)
GLUCOSE: 106 mg/dL (ref 70–140)
POTASSIUM: 4 meq/L (ref 3.5–5.1)
SODIUM: 141 meq/L (ref 136–145)
Total Bilirubin: 0.78 mg/dL (ref 0.20–1.20)
Total Protein: 7 g/dL (ref 6.4–8.3)

## 2016-05-06 MED ORDER — SODIUM CHLORIDE 0.9% FLUSH
10.0000 mL | INTRAVENOUS | Status: DC | PRN
  Administered 2016-05-06: 10 mL
  Filled 2016-05-06: qty 10

## 2016-05-06 MED ORDER — HEPARIN SOD (PORK) LOCK FLUSH 100 UNIT/ML IV SOLN
500.0000 [IU] | Freq: Once | INTRAVENOUS | Status: AC | PRN
  Administered 2016-05-06: 500 [IU]
  Filled 2016-05-06: qty 5

## 2016-05-06 MED ORDER — TRASTUZUMAB CHEMO INJECTION 440 MG
6.0000 mg/kg | Freq: Once | INTRAVENOUS | Status: AC
  Administered 2016-05-06: 462 mg via INTRAVENOUS
  Filled 2016-05-06: qty 22

## 2016-05-06 MED ORDER — ACETAMINOPHEN 325 MG PO TABS
650.0000 mg | ORAL_TABLET | Freq: Once | ORAL | Status: AC
  Administered 2016-05-06: 650 mg via ORAL

## 2016-05-06 MED ORDER — ACETAMINOPHEN 325 MG PO TABS
ORAL_TABLET | ORAL | Status: AC
  Filled 2016-05-06: qty 2

## 2016-05-06 MED ORDER — SODIUM CHLORIDE 0.9 % IV SOLN
Freq: Once | INTRAVENOUS | Status: AC
  Administered 2016-05-06: 11:00:00 via INTRAVENOUS

## 2016-05-06 NOTE — Patient Instructions (Signed)
[facility] Discharge Instructions for Patients Receiving Chemotherapy  Today you received the following chemotherapy agents:  Herceptin  To help prevent nausea and vomiting after your treatment, we encourage you to take your nausea medication as prescribed.   If you develop nausea and vomiting that is not controlled by your nausea medication, call the clinic.   BELOW ARE SYMPTOMS THAT SHOULD BE REPORTED IMMEDIATELY:  *FEVER GREATER THAN 100.5 F  *CHILLS WITH OR WITHOUT FEVER  NAUSEA AND VOMITING THAT IS NOT CONTROLLED WITH YOUR NAUSEA MEDICATION  *UNUSUAL SHORTNESS OF BREATH  *UNUSUAL BRUISING OR BLEEDING  TENDERNESS IN MOUTH AND THROAT WITH OR WITHOUT PRESENCE OF ULCERS  *URINARY PROBLEMS  *BOWEL PROBLEMS  UNUSUAL RASH Items with * indicate a potential emergency and should be followed up as soon as possible.  Feel free to call the clinic you have any questions or concerns. The clinic phone number is [phone].  Please show the CHEMO ALERT CARD at check-in to the Emergency Department and triage nurse.   

## 2016-05-06 NOTE — Progress Notes (Signed)
Okay to treat today despite pt being overdue for ECHO per Lindi Adie, MD.  Pt will be rescheduled for follow up ECHO.

## 2016-05-06 NOTE — Progress Notes (Signed)
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    CHIEF COMPLIANT: Follow-up on Herceptin and tamoxifen  INTERVAL HISTORY: Lori Dodson is a 30 year old with above-mentioned history of right breast cancer who underwent right mastectomy and a complete pathologic response to neoadjuvant chemotherapy. She is currently on Herceptin maintenance and appears to be tolerating it fairly well. She was started on tamoxifen and appears to have been tolerating it fairly well as well. She does have occasional hot flashes. Denies any myalgias.  REVIEW OF SYSTEMS:   Constitutional: Denies fevers, chills or abnormal weight loss Eyes: Denies blurriness of vision Ears, nose, mouth, throat, and face: Denies mucositis or sore throat Respiratory: Denies cough, dyspnea or wheezes Cardiovascular: Denies palpitation, chest discomfort Gastrointestinal:  Denies nausea, heartburn or change in bowel habits Skin: Denies abnormal skin rashes Lymphatics:  Denies new lymphadenopathy or easy bruising Neurological:Denies numbness, tingling or new weaknesses Behavioral/Psych: Mood is stable, no new changes  Extremities: No lower extremity edema Breast:  denies any pain or lumps or nodules in either breasts All other systems were reviewed with the patient and are negative.  I have reviewed the past medical history, past surgical history, social history and family history with the patient and they are unchanged from previous note.  ALLERGIES:  is allergic to latex.  MEDICATIONS:  Current Outpatient Prescriptions  Medication Sig Dispense Refill  . lidocaine-prilocaine (EMLA) cream Apply to affected area once 30 g 3  . tamoxifen (NOLVADEX) 20 MG tablet Take 1 tablet (20 mg total) by mouth daily. 90 tablet 3   No current facility-administered medications for this visit.   Facility-Administered Medications Ordered in Other Visits  Medication Dose Route Frequency Provider Last Rate Last Dose  . sodium chloride flush (NS) 0.9 % injection 10 mL  10 mL Intracatheter PRN Nicholas Lose, MD   10 mL at 05/06/16 1119    PHYSICAL EXAMINATION: ECOG PERFORMANCE STATUS: 1 - Symptomatic but completely ambulatory  Filed Vitals:   05/06/16 0930  BP: 111/71  Pulse: 64  Temp: 98.6 F (37 C)  Resp: 18   Filed Weights   05/06/16 0930  Weight: 167 lb 11.2 oz (76.068 kg)    GENERAL:alert, no distress and comfortable SKIN: skin color, texture, turgor are normal, no rashes or significant lesions EYES: normal, Conjunctiva are pink and non-injected, sclera clear OROPHARYNX:no exudate, no erythema and lips, buccal mucosa, and tongue normal  NECK: supple, thyroid normal size, non-tender, without nodularity LYMPH:  no palpable lymphadenopathy in the cervical, axillary or inguinal LUNGS: clear to auscultation and percussion with normal breathing  effort HEART: regular rate & rhythm and no murmurs and no lower extremity edema ABDOMEN:abdomen soft, non-tender and  normal bowel sounds MUSCULOSKELETAL:no cyanosis of digits and no clubbing  NEURO: alert & oriented x 3 with fluent speech, no focal motor/sensory deficits EXTREMITIES: No lower extremity edema LABORATORY DATA:  I have reviewed the data as listed   Chemistry      Component Value Date/Time   NA 141 05/06/2016 0916   NA 142 02/05/2016 1032   K 4.0 05/06/2016 0916   K 4.2 02/05/2016 1032   CL 109 02/05/2016 1032   CO2 25 05/06/2016 0916   CO2 23 02/05/2016 1032   BUN 12.8 05/06/2016 0916   BUN 10 02/05/2016 1032   CREATININE 1.0 05/06/2016 0916   CREATININE 0.96 02/05/2016 1032      Component Value Date/Time   CALCIUM 9.2 05/06/2016 0916   CALCIUM 9.5 02/05/2016 1032   ALKPHOS 55 05/06/2016 0916   AST 12 05/06/2016 0916   ALT 10 05/06/2016 0916   BILITOT 0.78 05/06/2016 0916       Lab Results  Component Value Date   WBC 6.9 05/06/2016   HGB 12.9 05/06/2016   HCT 36.8 05/06/2016   MCV 85.4 05/06/2016   PLT 229 05/06/2016   NEUTROABS 2.6 05/06/2016     ASSESSMENT & PLAN:  Breast cancer of upper-outer quadrant of right female breast ([place]) 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 No role of XRT 2. Adjuvant Anti estrogen therapy with Tamoxifen 20 mg daily X 10 years started 02/18/2016 3. Continue with Herceptin maintenance every 3 weeks.  Tamoxifen toxicities:  Severe postsurgical pain: Resolved  Return to clinic every 3 weeks with Herceptin and received 6 weeks for  follow-up   Orders Placed This Encounter  Procedures  . ECHOCARDIOGRAM COMPLETE    Standing Status: Future     Number of Occurrences:      Standing Expiration Date: 08/06/2017    Order Specific Question:  Where should this test be performed    Answer:  [facility]    Order Specific Question:  Complete or Limited study?    Answer:  Complete    Order Specific Question:  Does the patient have a known history of hypersensitivity to Perflutren?    Answer:  No - With Imaging Enhancing Agent    Order Specific Question:  Expected Date:    Answer:  ASAP    Order Specific Question:  Reason for exam-Echo    Answer:  Chemo  V67.2 / Z09   The patient has a good understanding of the overall plan. she agrees with it. she will call with any problems that may develop before the next visit here.   Rulon Eisenmenger, MD 05/06/2016

## 2016-05-06 NOTE — Telephone Encounter (Signed)
appt made and avs printed °

## 2016-05-06 NOTE — Assessment & Plan Note (Signed)
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 No role of XRT 2. Adjuvant Anti estrogen therapy with Tamoxifen 20 mg daily X 10 years started 02/18/2016 3. Continue with Herceptin maintenance every 3 weeks. --------------------------------------------------------------------------------------------------------- Tamoxifen toxicities:  Severe postsurgical pain: Resolved  Return to clinic every 3 weeks with Herceptin and received 6 weeks for follow-up 

## 2016-05-07 ENCOUNTER — Telehealth: Payer: Self-pay

## 2016-05-07 ENCOUNTER — Ambulatory Visit: Payer: BLUE CROSS/BLUE SHIELD | Admitting: Hematology and Oncology

## 2016-05-07 ENCOUNTER — Ambulatory Visit: Payer: BLUE CROSS/BLUE SHIELD

## 2016-05-07 ENCOUNTER — Ambulatory Visit (HOSPITAL_COMMUNITY): Admission: RE | Admit: 2016-05-07 | Payer: BLUE CROSS/BLUE SHIELD | Source: Ambulatory Visit

## 2016-05-07 ENCOUNTER — Other Ambulatory Visit: Payer: BLUE CROSS/BLUE SHIELD

## 2016-05-07 NOTE — Telephone Encounter (Signed)
Order placed for ECHO and appointment scheduled yesterday 05/06/16 as pt overdue for procedure.  Received call from radiology stating pt called and cancelled appointment.  Called to follow up with pt to ensure everything was okay and to ensure she was planning to reschedule this appointment.  Pt verbalized understanding of importance to get this procedure done and agrees to call to reschedule once she has her schedule.  Pt without questions and concerns at time of call.

## 2016-05-20 ENCOUNTER — Ambulatory Visit: Payer: BLUE CROSS/BLUE SHIELD | Admitting: Hematology and Oncology

## 2016-05-22 ENCOUNTER — Ambulatory Visit (HOSPITAL_COMMUNITY)
Admission: RE | Admit: 2016-05-22 | Discharge: 2016-05-22 | Disposition: A | Discharge status: Home / Self Care | Payer: BLUE CROSS/BLUE SHIELD | Source: Ambulatory Visit | Attending: Hematology and Oncology | Admitting: Hematology and Oncology

## 2016-05-22 DIAGNOSIS — I340 Nonrheumatic mitral (valve) insufficiency: Secondary | ICD-10-CM | POA: Insufficient documentation

## 2016-05-22 DIAGNOSIS — C50411 Malignant neoplasm of upper-outer quadrant of right female breast: Secondary | ICD-10-CM | POA: Insufficient documentation

## 2016-05-22 DIAGNOSIS — Z09 Encounter for follow-up examination after completed treatment for conditions other than malignant neoplasm: Secondary | ICD-10-CM | POA: Diagnosis present

## 2016-05-22 LAB — ECHOCARDIOGRAM COMPLETE
CHL CUP MV DEC (S): 222
EERAT: 5.42
EWDT: 222 ms
FS: 26 % — AB (ref 28–44)
IV/PV OW: 0.92
LA diam end sys: 32 mm
LA vol A4C: 29.3 ml
LA vol: 31.1 mL
LADIAMINDEX: 1.79 cm/m2
LASIZE: 32 mm
LAVOLIN: 17.4 mL/m2
LV E/e' medial: 5.42
LV PW d: 9.89 mm — AB (ref 0.6–1.1)
LV TDI E'LATERAL: 16.3
LV TDI E'MEDIAL: 9.25
LVEEAVG: 5.42
LVELAT: 16.3 cm/s
LVOT area: 2.54 cm2
LVOT diameter: 18 mm
MV pk A vel: 66.9 m/s
MVPG: 3 mmHg
MVPKEVEL: 88.3 m/s

## 2016-05-22 NOTE — Progress Notes (Signed)
  Echocardiogram 2D Echocardiogram has been performed.  Diamond Nickel 05/22/2016, 10:54 AM

## 2016-05-27 ENCOUNTER — Telehealth: Payer: Self-pay | Admitting: *Deleted

## 2016-05-27 ENCOUNTER — Other Ambulatory Visit: Payer: BLUE CROSS/BLUE SHIELD

## 2016-05-27 ENCOUNTER — Ambulatory Visit: Payer: BLUE CROSS/BLUE SHIELD

## 2016-05-27 NOTE — Telephone Encounter (Signed)
Patient called and moved appts from today to tomorrow

## 2016-05-28 ENCOUNTER — Ambulatory Visit (HOSPITAL_BASED_OUTPATIENT_CLINIC_OR_DEPARTMENT_OTHER): Payer: BLUE CROSS/BLUE SHIELD

## 2016-05-28 ENCOUNTER — Other Ambulatory Visit (HOSPITAL_BASED_OUTPATIENT_CLINIC_OR_DEPARTMENT_OTHER): Payer: BLUE CROSS/BLUE SHIELD

## 2016-05-28 VITALS — BP 111/75 | HR 72 | Temp 97.6°F | Resp 18

## 2016-05-28 DIAGNOSIS — Z5112 Encounter for antineoplastic immunotherapy: Secondary | ICD-10-CM | POA: Diagnosis not present

## 2016-05-28 DIAGNOSIS — C50411 Malignant neoplasm of upper-outer quadrant of right female breast: Secondary | ICD-10-CM | POA: Diagnosis not present

## 2016-05-28 LAB — CBC WITH DIFFERENTIAL/PLATELET
BASO%: 0.3 % (ref 0.0–2.0)
BASOS ABS: 0 10*3/uL (ref 0.0–0.1)
EOS%: 2 % (ref 0.0–7.0)
Eosinophils Absolute: 0.2 10*3/uL (ref 0.0–0.5)
HEMATOCRIT: 36.4 % (ref 34.8–46.6)
HEMOGLOBIN: 12.8 g/dL (ref 11.6–15.9)
LYMPH#: 4.3 10*3/uL — AB (ref 0.9–3.3)
LYMPH%: 56.7 % — ABNORMAL HIGH (ref 14.0–49.7)
MCH: 29.9 pg (ref 25.1–34.0)
MCHC: 35.2 g/dL (ref 31.5–36.0)
MCV: 85 fL (ref 79.5–101.0)
MONO#: 0.4 10*3/uL (ref 0.1–0.9)
MONO%: 5.6 % (ref 0.0–14.0)
NEUT%: 35.4 % — ABNORMAL LOW (ref 38.4–76.8)
NEUTROS ABS: 2.7 10*3/uL (ref 1.5–6.5)
Platelets: 223 10*3/uL (ref 145–400)
RBC: 4.28 10*6/uL (ref 3.70–5.45)
RDW: 12.6 % (ref 11.2–14.5)
WBC: 7.6 10*3/uL (ref 3.9–10.3)

## 2016-05-28 LAB — COMPREHENSIVE METABOLIC PANEL
ALBUMIN: 3.9 g/dL (ref 3.5–5.0)
ALK PHOS: 57 U/L (ref 40–150)
ALT: 10 U/L (ref 0–55)
AST: 13 U/L (ref 5–34)
Anion Gap: 10 mEq/L (ref 3–11)
BUN: 22.6 mg/dL (ref 7.0–26.0)
CALCIUM: 9.2 mg/dL (ref 8.4–10.4)
CO2: 25 mEq/L (ref 22–29)
CREATININE: 0.9 mg/dL (ref 0.6–1.1)
Chloride: 106 mEq/L (ref 98–109)
EGFR: >90 mL/min/{1.73_m2} (ref >90)
GLUCOSE: 96 mg/dL (ref 70–140)
Potassium: 3.8 mEq/L (ref 3.5–5.1)
SODIUM: 142 meq/L (ref 136–145)
TOTAL PROTEIN: 7.2 g/dL (ref 6.4–8.3)
Total Bilirubin: 0.38 mg/dL (ref 0.20–1.20)

## 2016-05-28 MED ORDER — SODIUM CHLORIDE 0.9% FLUSH
10.0000 mL | INTRAVENOUS | Status: DC | PRN
  Filled 2016-05-28: qty 10

## 2016-05-28 MED ORDER — ACETAMINOPHEN 325 MG PO TABS
ORAL_TABLET | ORAL | Status: AC
  Filled 2016-05-28: qty 2

## 2016-05-28 MED ORDER — SODIUM CHLORIDE 0.9 % IV SOLN
Freq: Once | INTRAVENOUS | Status: AC
  Administered 2016-05-28: 10:00:00 via INTRAVENOUS

## 2016-05-28 MED ORDER — HEPARIN SOD (PORK) LOCK FLUSH 100 UNIT/ML IV SOLN
500.0000 [IU] | Freq: Once | INTRAVENOUS | Status: AC | PRN
  Administered 2016-05-28: 500 [IU]
  Filled 2016-05-28: qty 5

## 2016-05-28 MED ORDER — ACETAMINOPHEN 325 MG PO TABS
650.0000 mg | ORAL_TABLET | Freq: Once | ORAL | Status: AC
  Administered 2016-05-28: 650 mg via ORAL

## 2016-05-28 MED ORDER — TRASTUZUMAB CHEMO INJECTION 440 MG
6.0000 mg/kg | Freq: Once | INTRAVENOUS | Status: AC
  Administered 2016-05-28: 462 mg via INTRAVENOUS
  Filled 2016-05-28: qty 22

## 2016-05-28 NOTE — Patient Instructions (Signed)
[facility] Discharge Instructions for Patients Receiving Chemotherapy  Today you received the following chemotherapy agents:  Herceptin  To help prevent nausea and vomiting after your treatment, we encourage you to take your nausea medication as prescribed.   If you develop nausea and vomiting that is not controlled by your nausea medication, call the clinic.   BELOW ARE SYMPTOMS THAT SHOULD BE REPORTED IMMEDIATELY:  *FEVER GREATER THAN 100.5 F  *CHILLS WITH OR WITHOUT FEVER  NAUSEA AND VOMITING THAT IS NOT CONTROLLED WITH YOUR NAUSEA MEDICATION  *UNUSUAL SHORTNESS OF BREATH  *UNUSUAL BRUISING OR BLEEDING  TENDERNESS IN MOUTH AND THROAT WITH OR WITHOUT PRESENCE OF ULCERS  *URINARY PROBLEMS  *BOWEL PROBLEMS  UNUSUAL RASH Items with * indicate a potential emergency and should be followed up as soon as possible.  Feel free to call the clinic you have any questions or concerns. The clinic phone number is [phone].  Please show the CHEMO ALERT CARD at check-in to the Emergency Department and triage nurse.   

## 2016-06-16 ENCOUNTER — Ambulatory Visit (HOSPITAL_BASED_OUTPATIENT_CLINIC_OR_DEPARTMENT_OTHER): Payer: BLUE CROSS/BLUE SHIELD

## 2016-06-16 ENCOUNTER — Other Ambulatory Visit (HOSPITAL_BASED_OUTPATIENT_CLINIC_OR_DEPARTMENT_OTHER): Payer: BLUE CROSS/BLUE SHIELD

## 2016-06-16 ENCOUNTER — Ambulatory Visit (HOSPITAL_BASED_OUTPATIENT_CLINIC_OR_DEPARTMENT_OTHER): Payer: BLUE CROSS/BLUE SHIELD | Admitting: Hematology and Oncology

## 2016-06-16 ENCOUNTER — Encounter: Payer: Self-pay | Admitting: Hematology and Oncology

## 2016-06-16 VITALS — BP 114/80 | HR 59 | Temp 98.0°F | Resp 18 | Wt 171.6 lb

## 2016-06-16 DIAGNOSIS — Z5112 Encounter for antineoplastic immunotherapy: Secondary | ICD-10-CM

## 2016-06-16 DIAGNOSIS — C50411 Malignant neoplasm of upper-outer quadrant of right female breast: Secondary | ICD-10-CM

## 2016-06-16 DIAGNOSIS — R635 Abnormal weight gain: Secondary | ICD-10-CM

## 2016-06-16 DIAGNOSIS — N951 Menopausal and female climacteric states: Secondary | ICD-10-CM

## 2016-06-16 LAB — CBC WITH DIFFERENTIAL/PLATELET
BASO%: 0.6 % (ref 0.0–2.0)
BASOS ABS: 0 10*3/uL (ref 0.0–0.1)
EOS ABS: 0.2 10*3/uL (ref 0.0–0.5)
EOS%: 2.5 % (ref 0.0–7.0)
HEMATOCRIT: 38.3 % (ref 34.8–46.6)
HEMOGLOBIN: 13 g/dL (ref 11.6–15.9)
LYMPH%: 47.7 % (ref 14.0–49.7)
MCH: 29.4 pg (ref 25.1–34.0)
MCHC: 34 g/dL (ref 31.5–36.0)
MCV: 86.6 fL (ref 79.5–101.0)
MONO#: 0.5 10*3/uL (ref 0.1–0.9)
MONO%: 6.7 % (ref 0.0–14.0)
NEUT#: 2.9 10*3/uL (ref 1.5–6.5)
NEUT%: 42.5 % (ref 38.4–76.8)
Platelets: 222 10*3/uL (ref 145–400)
RBC: 4.43 10*6/uL (ref 3.70–5.45)
RDW: 13 % (ref 11.2–14.5)
WBC: 6.8 10*3/uL (ref 3.9–10.3)
lymph#: 3.2 10*3/uL (ref 0.9–3.3)

## 2016-06-16 LAB — COMPREHENSIVE METABOLIC PANEL
ALBUMIN: 3.6 g/dL (ref 3.5–5.0)
ALK PHOS: 51 U/L (ref 40–150)
ALT: 11 U/L (ref 0–55)
AST: 14 U/L (ref 5–34)
Anion Gap: 8 mEq/L (ref 3–11)
BILIRUBIN TOTAL: 0.56 mg/dL (ref 0.20–1.20)
BUN: 16.6 mg/dL (ref 7.0–26.0)
CO2: 26 mEq/L (ref 22–29)
CREATININE: 1 mg/dL (ref 0.6–1.1)
Calcium: 9 mg/dL (ref 8.4–10.4)
Chloride: 108 mEq/L (ref 98–109)
EGFR: >90 mL/min/{1.73_m2} (ref >90)
GLUCOSE: 106 mg/dL (ref 70–140)
Potassium: 4.1 mEq/L (ref 3.5–5.1)
SODIUM: 142 meq/L (ref 136–145)
TOTAL PROTEIN: 6.8 g/dL (ref 6.4–8.3)

## 2016-06-16 MED ORDER — SODIUM CHLORIDE 0.9% FLUSH
10.0000 mL | INTRAVENOUS | Status: DC | PRN
  Administered 2016-06-16 (×2): 10 mL
  Filled 2016-06-16: qty 10

## 2016-06-16 MED ORDER — SODIUM CHLORIDE 0.9 % IV SOLN
Freq: Once | INTRAVENOUS | Status: AC
  Administered 2016-06-16: 11:00:00 via INTRAVENOUS

## 2016-06-16 MED ORDER — ACETAMINOPHEN 325 MG PO TABS
650.0000 mg | ORAL_TABLET | Freq: Once | ORAL | Status: AC
  Administered 2016-06-16: 650 mg via ORAL

## 2016-06-16 MED ORDER — SODIUM CHLORIDE 0.9 % IV SOLN
6.0000 mg/kg | Freq: Once | INTRAVENOUS | Status: AC
  Administered 2016-06-16: 462 mg via INTRAVENOUS
  Filled 2016-06-16: qty 22

## 2016-06-16 MED ORDER — ACETAMINOPHEN 325 MG PO TABS
ORAL_TABLET | ORAL | Status: AC
  Filled 2016-06-16: qty 2

## 2016-06-16 MED ORDER — HEPARIN SOD (PORK) LOCK FLUSH 100 UNIT/ML IV SOLN
500.0000 [IU] | Freq: Once | INTRAVENOUS | Status: AC | PRN
  Administered 2016-06-16: 500 [IU]
  Filled 2016-06-16: qty 5

## 2016-06-16 NOTE — Assessment & Plan Note (Signed)
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 No role of XRT 2. Adjuvant Anti estrogen therapy with Tamoxifen 20 mg daily X 10 years started 02/18/2016 3. Continue with Herceptin maintenance every 3 weeks. --------------------------------------------------------------------------------------------------------- Tamoxifen toxicities:  Severe postsurgical pain: Resolved  Return to clinic every 3 weeks with Herceptin and received 6 weeks for follow-up

## 2016-06-16 NOTE — Patient Instructions (Signed)
[facility] Discharge Instructions for Patients Receiving Chemotherapy  Today you received the following chemotherapy agents:  Herceptin  To help prevent nausea and vomiting after your treatment, we encourage you to take your nausea medication as prescribed.   If you develop nausea and vomiting that is not controlled by your nausea medication, call the clinic.   BELOW ARE SYMPTOMS THAT SHOULD BE REPORTED IMMEDIATELY:  *FEVER GREATER THAN 100.5 F  *CHILLS WITH OR WITHOUT FEVER  NAUSEA AND VOMITING THAT IS NOT CONTROLLED WITH YOUR NAUSEA MEDICATION  *UNUSUAL SHORTNESS OF BREATH  *UNUSUAL BRUISING OR BLEEDING  TENDERNESS IN MOUTH AND THROAT WITH OR WITHOUT PRESENCE OF ULCERS  *URINARY PROBLEMS  *BOWEL PROBLEMS  UNUSUAL RASH Items with * indicate a potential emergency and should be followed up as soon as possible.  Feel free to call the clinic you have any questions or concerns. The clinic phone number is [phone].  Please show the CHEMO ALERT CARD at check-in to the Emergency Department and triage nurse.   

## 2016-06-16 NOTE — Progress Notes (Signed)
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    CHIEF COMPLIANT: Follow-up on tamoxifen and Herceptin  INTERVAL HISTORY: Lori Dodson is a 30 year old with above-mentioned history of right breast cancer currently on Herceptin maintenance along with tamoxifen therapy. She is tolerating tamoxifen much better. Hot flashes have improved significantly. She does not have any major side effects to Herceptin. She is complaining of profound weight gain related to tamoxifen therapy. She has not been exercising frequently. She is also not watching what she eats.  REVIEW OF SYSTEMS:   Constitutional: Denies fevers, chills or abnormal weight loss Eyes: Denies blurriness of vision Ears, nose, mouth, throat, and face: Denies mucositis or sore throat Respiratory: Denies cough, dyspnea or wheezes Cardiovascular: Denies palpitation, chest discomfort Gastrointestinal:  Denies nausea, heartburn or change in bowel habits Skin: Denies abnormal  skin rashes Lymphatics: Denies new lymphadenopathy or easy bruising Neurological:Denies numbness, tingling or new weaknesses Behavioral/Psych: Mood is stable, no new changes  Extremities: No lower extremity edema Breast:  denies any pain or lumps or nodules in either breasts All other systems were reviewed with the patient and are negative.  I have reviewed the past medical history, past surgical history, social history and family history with the patient and they are unchanged from previous note.  ALLERGIES:  is allergic to latex.  MEDICATIONS:  Current Outpatient Prescriptions  Medication Sig Dispense Refill  . lidocaine-prilocaine (EMLA) cream Apply to affected area once 30 g 3  . tamoxifen (NOLVADEX) 20 MG tablet Take 1 tablet (20 mg total) by mouth daily. 90 tablet 3   No current facility-administered medications for this visit.    PHYSICAL EXAMINATION: ECOG PERFORMANCE STATUS: 1 - Symptomatic but completely ambulatory  Filed Vitals:   06/16/16 1044  BP: 114/80  Pulse: 59  Temp: 98 F (36.7 C)  Resp: 18   Filed Weights   06/16/16 1044  Weight: 171 lb 9.6 oz (77.837 kg)    GENERAL:alert, no distress and comfortable SKIN: skin color, texture, turgor are normal, no rashes or significant lesions EYES: normal, Conjunctiva are pink and non-injected, sclera clear OROPHARYNX:no exudate, no erythema and lips, buccal mucosa, and tongue normal  NECK: supple, thyroid normal size, non-tender, without nodularity LYMPH:  no palpable lymphadenopathy in the cervical, axillary or inguinal LUNGS: clear to auscultation and percussion with normal breathing effort HEART: regular rate & rhythm and no murmurs and no lower extremity edema ABDOMEN:abdomen soft, non-tender and normal bowel sounds MUSCULOSKELETAL:no cyanosis of digits and no clubbing  NEURO: alert & oriented x 3 with fluent speech, no focal motor/sensory deficits  EXTREMITIES: No lower extremity edema  LABORATORY DATA:  I  have reviewed the data as listed   Chemistry      Component Value Date/Time   NA 142 06/16/2016 1037   NA 142 02/05/2016 1032   K 4.1 06/16/2016 1037   K 4.2 02/05/2016 1032   CL 109 02/05/2016 1032   CO2 26 06/16/2016 1037   CO2 23 02/05/2016 1032   BUN 16.6 06/16/2016 1037   BUN 10 02/05/2016 1032   CREATININE 1.0 06/16/2016 1037   CREATININE 0.96 02/05/2016 1032      Component Value Date/Time   CALCIUM 9.0 06/16/2016 1037   CALCIUM 9.5 02/05/2016 1032   ALKPHOS 51 06/16/2016 1037   AST 14 06/16/2016 1037   ALT 11 06/16/2016 1037   BILITOT 0.56 06/16/2016 1037       Lab Results  Component Value Date   WBC 6.8 06/16/2016   HGB 13.0 06/16/2016   HCT 38.3 06/16/2016   MCV 86.6 06/16/2016   PLT 222 06/16/2016   NEUTROABS 2.9 06/16/2016     ASSESSMENT & PLAN:  Breast cancer of upper-outer quadrant of right female breast ([place]) 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 No role of XRT 2. Adjuvant Anti estrogen therapy with Tamoxifen 20 mg daily X 10 years started 02/18/2016 3. Continue with Herceptin maintenance every 3 weeks. --------------------------------------------------------------------------------------------------------- Tamoxifen toxicities: 1. Hot flashes: They have improved over time. 2. Weight gain: I provided her instructions on weight loss program that we have here, living well. I also gave her the [employer] live strong program information. She however lives in [place]  which makes it difficult for her. I recommended that she participate in the [facility] program.  Severe postsurgical pain: Resolved  Return to clinic every 3 weeks with Herceptin and received 6 weeks for follow-up  No orders of the defined types were placed in this encounter.   The patient has a good understanding of the overall plan. she agrees with it. she will call with any problems that may develop before the next visit here.   Rulon Eisenmenger, MD 06/16/2016

## 2016-06-17 ENCOUNTER — Ambulatory Visit: Payer: BLUE CROSS/BLUE SHIELD

## 2016-06-17 ENCOUNTER — Other Ambulatory Visit: Payer: BLUE CROSS/BLUE SHIELD

## 2016-06-17 ENCOUNTER — Ambulatory Visit: Payer: BLUE CROSS/BLUE SHIELD | Admitting: Hematology and Oncology

## 2016-06-19 ENCOUNTER — Encounter: Payer: Self-pay | Admitting: Hematology and Oncology

## 2016-06-19 NOTE — Progress Notes (Signed)
Forms left in box for hubby cortez-fmla

## 2016-06-19 NOTE — Progress Notes (Signed)
forms left in box- left fmla form for dr. Lindi Adie to sign

## 2016-06-20 ENCOUNTER — Encounter: Payer: Self-pay | Admitting: Hematology and Oncology

## 2016-06-20 NOTE — Progress Notes (Signed)
forms left in box- left fmla form for dr. Lindi Adie to sign- faxed [phone] and mailed copy to patient and sent to medical records

## 2016-06-27 ENCOUNTER — Telehealth: Payer: Self-pay | Admitting: Hematology and Oncology

## 2016-06-27 NOTE — Telephone Encounter (Signed)
pt cld to get appt time & date-gave pt appt

## 2016-07-08 ENCOUNTER — Ambulatory Visit (HOSPITAL_BASED_OUTPATIENT_CLINIC_OR_DEPARTMENT_OTHER): Payer: BLUE CROSS/BLUE SHIELD

## 2016-07-08 ENCOUNTER — Encounter: Payer: Self-pay | Admitting: *Deleted

## 2016-07-08 ENCOUNTER — Other Ambulatory Visit (HOSPITAL_BASED_OUTPATIENT_CLINIC_OR_DEPARTMENT_OTHER): Payer: BLUE CROSS/BLUE SHIELD

## 2016-07-08 VITALS — BP 98/65 | HR 64 | Temp 98.2°F | Resp 20

## 2016-07-08 DIAGNOSIS — C50411 Malignant neoplasm of upper-outer quadrant of right female breast: Secondary | ICD-10-CM

## 2016-07-08 DIAGNOSIS — Z5111 Encounter for antineoplastic chemotherapy: Secondary | ICD-10-CM | POA: Diagnosis not present

## 2016-07-08 LAB — CBC WITH DIFFERENTIAL/PLATELET
BASO%: 0.5 % (ref 0.0–2.0)
Basophils Absolute: 0 10*3/uL (ref 0.0–0.1)
EOS ABS: 0.1 10*3/uL (ref 0.0–0.5)
EOS%: 2.2 % (ref 0.0–7.0)
HCT: 38.1 % (ref 34.8–46.6)
HGB: 12.9 g/dL (ref 11.6–15.9)
LYMPH%: 42.9 % (ref 14.0–49.7)
MCH: 29.1 pg (ref 25.1–34.0)
MCHC: 33.8 g/dL (ref 31.5–36.0)
MCV: 86.2 fL (ref 79.5–101.0)
MONO#: 0.4 10*3/uL (ref 0.1–0.9)
MONO%: 7.5 % (ref 0.0–14.0)
NEUT%: 46.9 % (ref 38.4–76.8)
NEUTROS ABS: 2.8 10*3/uL (ref 1.5–6.5)
Platelets: 210 10*3/uL (ref 145–400)
RBC: 4.42 10*6/uL (ref 3.70–5.45)
RDW: 12.4 % (ref 11.2–14.5)
WBC: 6 10*3/uL (ref 3.9–10.3)
lymph#: 2.6 10*3/uL (ref 0.9–3.3)

## 2016-07-08 LAB — COMPREHENSIVE METABOLIC PANEL
ALT: 13 U/L (ref 0–55)
AST: 16 U/L (ref 5–34)
Albumin: 3.6 g/dL (ref 3.5–5.0)
Alkaline Phosphatase: 52 U/L (ref 40–150)
Anion Gap: 9 mEq/L (ref 3–11)
BILIRUBIN TOTAL: 0.57 mg/dL (ref 0.20–1.20)
BUN: 22.9 mg/dL (ref 7.0–26.0)
CHLORIDE: 108 meq/L (ref 98–109)
CO2: 25 meq/L (ref 22–29)
Calcium: 9.5 mg/dL (ref 8.4–10.4)
Creatinine: 1 mg/dL (ref 0.6–1.1)
EGFR: 88 mL/min/{1.73_m2} — AB (ref >90)
GLUCOSE: 115 mg/dL (ref 70–140)
POTASSIUM: 4 meq/L (ref 3.5–5.1)
SODIUM: 141 meq/L (ref 136–145)
TOTAL PROTEIN: 6.9 g/dL (ref 6.4–8.3)

## 2016-07-08 MED ORDER — ACETAMINOPHEN 325 MG PO TABS
ORAL_TABLET | ORAL | Status: AC
  Filled 2016-07-08: qty 2

## 2016-07-08 MED ORDER — HEPARIN SOD (PORK) LOCK FLUSH 100 UNIT/ML IV SOLN
500.0000 [IU] | Freq: Once | INTRAVENOUS | Status: AC | PRN
  Administered 2016-07-08: 500 [IU]
  Filled 2016-07-08: qty 5

## 2016-07-08 MED ORDER — SODIUM CHLORIDE 0.9% FLUSH
10.0000 mL | INTRAVENOUS | Status: DC | PRN
  Administered 2016-07-08: 10 mL
  Filled 2016-07-08: qty 10

## 2016-07-08 MED ORDER — ACETAMINOPHEN 325 MG PO TABS
650.0000 mg | ORAL_TABLET | Freq: Once | ORAL | Status: AC
  Administered 2016-07-08: 650 mg via ORAL

## 2016-07-08 MED ORDER — SODIUM CHLORIDE 0.9 % IV SOLN
Freq: Once | INTRAVENOUS | Status: AC
  Administered 2016-07-08: 10:00:00 via INTRAVENOUS

## 2016-07-08 MED ORDER — TRASTUZUMAB CHEMO 150 MG IV SOLR
6.0000 mg/kg | Freq: Once | INTRAVENOUS | Status: AC
  Administered 2016-07-08: 462 mg via INTRAVENOUS
  Filled 2016-07-08: qty 22

## 2016-07-08 NOTE — Patient Instructions (Signed)
[facility] Discharge Instructions for Patients Receiving Chemotherapy  Today you received the following chemotherapy agents Herceptin  To help prevent nausea and vomiting after your treatment, we encourage you to take your nausea medication    If you develop nausea and vomiting that is not controlled by your nausea medication, call the clinic.   BELOW ARE SYMPTOMS THAT SHOULD BE REPORTED IMMEDIATELY:  *FEVER GREATER THAN 100.5 F  *CHILLS WITH OR WITHOUT FEVER  NAUSEA AND VOMITING THAT IS NOT CONTROLLED WITH YOUR NAUSEA MEDICATION  *UNUSUAL SHORTNESS OF BREATH  *UNUSUAL BRUISING OR BLEEDING  TENDERNESS IN MOUTH AND THROAT WITH OR WITHOUT PRESENCE OF ULCERS  *URINARY PROBLEMS  *BOWEL PROBLEMS  UNUSUAL RASH Items with * indicate a potential emergency and should be followed up as soon as possible.  Feel free to call the clinic you have any questions or concerns. The clinic phone number is [phone].  Please show the CHEMO ALERT CARD at check-in to the Emergency Department and triage nurse.   

## 2016-07-29 ENCOUNTER — Telehealth: Payer: Self-pay | Admitting: Hematology and Oncology

## 2016-07-29 ENCOUNTER — Other Ambulatory Visit (HOSPITAL_BASED_OUTPATIENT_CLINIC_OR_DEPARTMENT_OTHER): Payer: BLUE CROSS/BLUE SHIELD

## 2016-07-29 ENCOUNTER — Ambulatory Visit (HOSPITAL_BASED_OUTPATIENT_CLINIC_OR_DEPARTMENT_OTHER): Payer: BLUE CROSS/BLUE SHIELD

## 2016-07-29 ENCOUNTER — Ambulatory Visit (HOSPITAL_BASED_OUTPATIENT_CLINIC_OR_DEPARTMENT_OTHER): Payer: BLUE CROSS/BLUE SHIELD | Admitting: Hematology and Oncology

## 2016-07-29 ENCOUNTER — Encounter: Payer: Self-pay | Admitting: Hematology and Oncology

## 2016-07-29 DIAGNOSIS — Z5112 Encounter for antineoplastic immunotherapy: Secondary | ICD-10-CM

## 2016-07-29 DIAGNOSIS — R635 Abnormal weight gain: Secondary | ICD-10-CM | POA: Diagnosis not present

## 2016-07-29 DIAGNOSIS — C50411 Malignant neoplasm of upper-outer quadrant of right female breast: Secondary | ICD-10-CM

## 2016-07-29 DIAGNOSIS — N951 Menopausal and female climacteric states: Secondary | ICD-10-CM | POA: Diagnosis not present

## 2016-07-29 LAB — CBC WITH DIFFERENTIAL/PLATELET
BASO%: 0.4 % (ref 0.0–2.0)
BASOS ABS: 0 10*3/uL (ref 0.0–0.1)
EOS ABS: 0.2 10*3/uL (ref 0.0–0.5)
EOS%: 2.7 % (ref 0.0–7.0)
HCT: 39.4 % (ref 34.8–46.6)
HGB: 13.3 g/dL (ref 11.6–15.9)
LYMPH%: 49.4 % (ref 14.0–49.7)
MCH: 29.4 pg (ref 25.1–34.0)
MCHC: 33.7 g/dL (ref 31.5–36.0)
MCV: 87 fL (ref 79.5–101.0)
MONO#: 0.4 10*3/uL (ref 0.1–0.9)
MONO%: 5.4 % (ref 0.0–14.0)
NEUT#: 2.8 10*3/uL (ref 1.5–6.5)
NEUT%: 42.1 % (ref 38.4–76.8)
PLATELETS: 212 10*3/uL (ref 145–400)
RBC: 4.53 10*6/uL (ref 3.70–5.45)
RDW: 13.1 % (ref 11.2–14.5)
WBC: 6.7 10*3/uL (ref 3.9–10.3)
lymph#: 3.3 10*3/uL (ref 0.9–3.3)

## 2016-07-29 LAB — COMPREHENSIVE METABOLIC PANEL
ALBUMIN: 3.5 g/dL (ref 3.5–5.0)
ALK PHOS: 52 U/L (ref 40–150)
ALT: 12 U/L (ref 0–55)
AST: 14 U/L (ref 5–34)
Anion Gap: 10 mEq/L (ref 3–11)
BUN: 12.4 mg/dL (ref 7.0–26.0)
CALCIUM: 9 mg/dL (ref 8.4–10.4)
CHLORIDE: 109 meq/L (ref 98–109)
CO2: 23 mEq/L (ref 22–29)
Creatinine: 0.9 mg/dL (ref 0.6–1.1)
Glucose: 114 mg/dl (ref 70–140)
POTASSIUM: 4 meq/L (ref 3.5–5.1)
Sodium: 142 mEq/L (ref 136–145)
Total Bilirubin: 0.49 mg/dL (ref 0.20–1.20)
Total Protein: 6.6 g/dL (ref 6.4–8.3)

## 2016-07-29 MED ORDER — HEPARIN SOD (PORK) LOCK FLUSH 100 UNIT/ML IV SOLN
500.0000 [IU] | Freq: Once | INTRAVENOUS | Status: AC | PRN
  Administered 2016-07-29: 500 [IU]
  Filled 2016-07-29: qty 5

## 2016-07-29 MED ORDER — TRASTUZUMAB CHEMO 150 MG IV SOLR
6.0000 mg/kg | Freq: Once | INTRAVENOUS | Status: AC
  Administered 2016-07-29: 462 mg via INTRAVENOUS
  Filled 2016-07-29: qty 22

## 2016-07-29 MED ORDER — ACETAMINOPHEN 325 MG PO TABS
ORAL_TABLET | ORAL | Status: AC
Start: 2016-07-29 — End: 2016-07-29
  Filled 2016-07-29: qty 2

## 2016-07-29 MED ORDER — SODIUM CHLORIDE 0.9 % IV SOLN
Freq: Once | INTRAVENOUS | Status: AC
  Administered 2016-07-29: 10:00:00 via INTRAVENOUS

## 2016-07-29 MED ORDER — SODIUM CHLORIDE 0.9% FLUSH
10.0000 mL | INTRAVENOUS | Status: DC | PRN
  Administered 2016-07-29: 10 mL
  Filled 2016-07-29: qty 10

## 2016-07-29 MED ORDER — ACETAMINOPHEN 325 MG PO TABS
650.0000 mg | ORAL_TABLET | Freq: Once | ORAL | Status: AC
  Administered 2016-07-29: 650 mg via ORAL

## 2016-07-29 NOTE — Progress Notes (Signed)
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      02/18/2016 -  Anti-estrogen oral therapy    Tamoxifen 20 mg daily       CHIEF COMPLIANT: Follow-up on Herceptin and tamoxifen  INTERVAL HISTORY: Marylan Glore is a 30 year old with above-mentioned history of right breast cancer currently on adjuvant antiestrogen therapy with tamoxifen. She is tolerating tamoxifen fairly well. Hot flashes have improved. She is mainly concerned about her weight gain. She is working out every day and still not losing much weight.  REVIEW OF SYSTEMS:   Constitutional: Denies fevers, chills or abnormal weight loss Eyes: Denies blurriness of vision Ears, nose, mouth, throat, and face: Denies mucositis or sore throat Respiratory: Denies cough, dyspnea or wheezes Cardiovascular: Denies palpitation, chest discomfort Gastrointestinal:  Denies nausea, heartburn or change in bowel habits Skin:  Denies abnormal skin rashes Lymphatics: Denies new lymphadenopathy or easy bruising Neurological:Denies numbness, tingling or new weaknesses Behavioral/Psych: Mood is stable, no new changes  Extremities: No lower extremity edema Breast:  denies any pain or lumps or nodules in either breasts All other systems were reviewed with the patient and are negative.  I have reviewed the past medical history, past surgical history, social history and family history with the patient and they are unchanged from previous note.  ALLERGIES:  is allergic to latex.  MEDICATIONS:  Current Outpatient Prescriptions  Medication Sig Dispense Refill  . lidocaine-prilocaine (EMLA) cream Apply to affected area once 30 g 3  . tamoxifen (NOLVADEX) 20 MG tablet Take 1 tablet (20 mg total) by mouth daily. 90 tablet 3   No current facility-administered medications for this visit.     PHYSICAL EXAMINATION: ECOG PERFORMANCE STATUS: 0 - Asymptomatic  Vitals:   07/29/16 0902  BP: 102/82  Pulse: (!) 57  Resp: 18  Temp: 98 F (36.7 C)   Filed Weights   07/29/16 0902  Weight: 169 lb 11.2 oz (77 kg)    GENERAL:alert, no distress and comfortable SKIN: skin color, texture, turgor are normal, no rashes or significant lesions EYES: normal, Conjunctiva are pink and non-injected, sclera clear OROPHARYNX:no exudate, no erythema and lips, buccal mucosa, and tongue normal  NECK: supple, thyroid normal size, non-tender, without nodularity LYMPH:  no palpable lymphadenopathy in the cervical, axillary or inguinal LUNGS: clear to auscultation and percussion with normal breathing effort HEART: regular rate & rhythm  and no murmurs and no lower extremity edema ABDOMEN:abdomen soft, non-tender and normal bowel sounds MUSCULOSKELETAL:no cyanosis of digits and no clubbing  NEURO: alert & oriented x 3 with fluent speech, no focal motor/sensory deficits EXTREMITIES: No lower extremity edema  LABORATORY DATA:  I have reviewed  the data as listed   Chemistry      Component Value Date/Time   NA 141 07/08/2016 0914   K 4.0 07/08/2016 0914   CL 109 02/05/2016 1032   CO2 25 07/08/2016 0914   BUN 22.9 07/08/2016 0914   CREATININE 1.0 07/08/2016 0914      Component Value Date/Time   CALCIUM 9.5 07/08/2016 0914   ALKPHOS 52 07/08/2016 0914   AST 16 07/08/2016 0914   ALT 13 07/08/2016 0914   BILITOT 0.57 07/08/2016 0914       Lab Results  Component Value Date   WBC 6.7 07/29/2016   HGB 13.3 07/29/2016   HCT 39.4 07/29/2016   MCV 87.0 07/29/2016   PLT 212 07/29/2016   NEUTROABS 2.8 07/29/2016     ASSESSMENT & PLAN:  Breast cancer of upper-outer quadrant of right female breast (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 Adjuvant Anti estrogen therapy with Tamoxifen 20 mg daily X 10 years started 02/18/2016 2. Continue with Herceptin maintenance every 3 weeks until October 2017. --------------------------------------------------------------------------------------------------------- Tamoxifen toxicities: 1. Hot flashes: They have improved over time. 2. Weight gain: We discussed intermittent fasting along with high-intensity workout to reduce her weight. Her plan is to come down below 165 pounds in 6 weeks.  Return to clinic every 3 weeks with Herceptin and received 6 weeks for follow-up   No orders of the defined types were placed  in this encounter.  The patient has a good understanding of the overall plan. she agrees with it. she will call with any problems that may develop before the next visit here.   Rulon Eisenmenger, MD 07/29/16

## 2016-07-29 NOTE — Telephone Encounter (Signed)
appt made and avs printed °

## 2016-07-29 NOTE — Assessment & Plan Note (Signed)
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 Adjuvant Anti estrogen therapy with Tamoxifen 20 mg daily X 10 years started 02/18/2016 2. Continue with Herceptin maintenance every 3 weeks until October 2017. --------------------------------------------------------------------------------------------------------- Tamoxifen toxicities: 1. Hot flashes: They have improved over time. 2. Weight gain: Recommended exercise and watching her diet.   Return to clinic every 3 weeks with Herceptin and received 6 weeks for follow-up

## 2016-07-29 NOTE — Patient Instructions (Signed)
[facility] Discharge Instructions for Patients Receiving Chemotherapy  Today you received the following chemotherapy agents:  Herceptin  To help prevent nausea and vomiting after your treatment, we encourage you to take your nausea medication as prescribed.   If you develop nausea and vomiting that is not controlled by your nausea medication, call the clinic.   BELOW ARE SYMPTOMS THAT SHOULD BE REPORTED IMMEDIATELY:  *FEVER GREATER THAN 100.5 F  *CHILLS WITH OR WITHOUT FEVER  NAUSEA AND VOMITING THAT IS NOT CONTROLLED WITH YOUR NAUSEA MEDICATION  *UNUSUAL SHORTNESS OF BREATH  *UNUSUAL BRUISING OR BLEEDING  TENDERNESS IN MOUTH AND THROAT WITH OR WITHOUT PRESENCE OF ULCERS  *URINARY PROBLEMS  *BOWEL PROBLEMS  UNUSUAL RASH Items with * indicate a potential emergency and should be followed up as soon as possible.  Feel free to call the clinic you have any questions or concerns. The clinic phone number is [phone].  Please show the CHEMO ALERT CARD at check-in to the Emergency Department and triage nurse.   

## 2016-08-19 ENCOUNTER — Ambulatory Visit: Payer: BLUE CROSS/BLUE SHIELD

## 2016-08-19 ENCOUNTER — Other Ambulatory Visit: Payer: BLUE CROSS/BLUE SHIELD

## 2016-08-20 ENCOUNTER — Ambulatory Visit (HOSPITAL_BASED_OUTPATIENT_CLINIC_OR_DEPARTMENT_OTHER): Payer: BLUE CROSS/BLUE SHIELD

## 2016-08-20 ENCOUNTER — Other Ambulatory Visit (HOSPITAL_BASED_OUTPATIENT_CLINIC_OR_DEPARTMENT_OTHER): Payer: BLUE CROSS/BLUE SHIELD

## 2016-08-20 VITALS — BP 104/69 | HR 73 | Temp 98.6°F | Resp 16

## 2016-08-20 DIAGNOSIS — Z5112 Encounter for antineoplastic immunotherapy: Secondary | ICD-10-CM

## 2016-08-20 DIAGNOSIS — C50411 Malignant neoplasm of upper-outer quadrant of right female breast: Secondary | ICD-10-CM

## 2016-08-20 LAB — CBC WITH DIFFERENTIAL/PLATELET
BASO%: 0.2 % (ref 0.0–2.0)
BASOS ABS: 0 10*3/uL (ref 0.0–0.1)
EOS%: 1.2 % (ref 0.0–7.0)
Eosinophils Absolute: 0.1 10*3/uL (ref 0.0–0.5)
HEMATOCRIT: 35 % (ref 34.8–46.6)
HEMOGLOBIN: 12.4 g/dL (ref 11.6–15.9)
LYMPH#: 2.8 10*3/uL (ref 0.9–3.3)
LYMPH%: 42.9 % (ref 14.0–49.7)
MCH: 29.6 pg (ref 25.1–34.0)
MCHC: 35.4 g/dL (ref 31.5–36.0)
MCV: 83.5 fL (ref 79.5–101.0)
MONO#: 0.5 10*3/uL (ref 0.1–0.9)
MONO%: 7.3 % (ref 0.0–14.0)
NEUT%: 48.4 % (ref 38.4–76.8)
NEUTROS ABS: 3.1 10*3/uL (ref 1.5–6.5)
Platelets: 223 10*3/uL (ref 145–400)
RBC: 4.19 10*6/uL (ref 3.70–5.45)
RDW: 12.7 % (ref 11.2–14.5)
WBC: 6.4 10*3/uL (ref 3.9–10.3)

## 2016-08-20 LAB — COMPREHENSIVE METABOLIC PANEL
ALBUMIN: 3.6 g/dL (ref 3.5–5.0)
ALK PHOS: 50 U/L (ref 40–150)
ALT: 13 U/L (ref 0–55)
AST: 13 U/L (ref 5–34)
Anion Gap: 9 mEq/L (ref 3–11)
BUN: 15.9 mg/dL (ref 7.0–26.0)
CALCIUM: 9 mg/dL (ref 8.4–10.4)
CO2: 24 mEq/L (ref 22–29)
CREATININE: 1.1 mg/dL (ref 0.6–1.1)
Chloride: 110 mEq/L — ABNORMAL HIGH (ref 98–109)
EGFR: 79 mL/min/{1.73_m2} — ABNORMAL LOW (ref >90)
GLUCOSE: 126 mg/dL (ref 70–140)
POTASSIUM: 4 meq/L (ref 3.5–5.1)
SODIUM: 143 meq/L (ref 136–145)
Total Bilirubin: 0.75 mg/dL (ref 0.20–1.20)
Total Protein: 6.7 g/dL (ref 6.4–8.3)

## 2016-08-20 MED ORDER — SODIUM CHLORIDE 0.9% FLUSH
10.0000 mL | INTRAVENOUS | Status: DC | PRN
  Administered 2016-08-20: 10 mL
  Filled 2016-08-20: qty 10

## 2016-08-20 MED ORDER — ACETAMINOPHEN 325 MG PO TABS
ORAL_TABLET | ORAL | Status: AC
  Filled 2016-08-20: qty 2

## 2016-08-20 MED ORDER — HEPARIN SOD (PORK) LOCK FLUSH 100 UNIT/ML IV SOLN
500.0000 [IU] | Freq: Once | INTRAVENOUS | Status: AC | PRN
  Administered 2016-08-20: 500 [IU]
  Filled 2016-08-20: qty 5

## 2016-08-20 MED ORDER — ACETAMINOPHEN 325 MG PO TABS
650.0000 mg | ORAL_TABLET | Freq: Once | ORAL | Status: AC
  Administered 2016-08-20: 650 mg via ORAL

## 2016-08-20 MED ORDER — TRASTUZUMAB CHEMO 150 MG IV SOLR
6.0000 mg/kg | Freq: Once | INTRAVENOUS | Status: AC
  Administered 2016-08-20: 462 mg via INTRAVENOUS
  Filled 2016-08-20: qty 22

## 2016-08-20 MED ORDER — SODIUM CHLORIDE 0.9 % IV SOLN
Freq: Once | INTRAVENOUS | Status: AC
  Administered 2016-08-20: 16:00:00 via INTRAVENOUS

## 2016-08-20 NOTE — Patient Instructions (Signed)
[facility] Discharge Instructions for Patients Receiving Chemotherapy  Today you received the following chemotherapy agents:  Herceptin  To help prevent nausea and vomiting after your treatment, we encourage you to take your nausea medication as prescribed.   If you develop nausea and vomiting that is not controlled by your nausea medication, call the clinic.   BELOW ARE SYMPTOMS THAT SHOULD BE REPORTED IMMEDIATELY:  *FEVER GREATER THAN 100.5 F  *CHILLS WITH OR WITHOUT FEVER  NAUSEA AND VOMITING THAT IS NOT CONTROLLED WITH YOUR NAUSEA MEDICATION  *UNUSUAL SHORTNESS OF BREATH  *UNUSUAL BRUISING OR BLEEDING  TENDERNESS IN MOUTH AND THROAT WITH OR WITHOUT PRESENCE OF ULCERS  *URINARY PROBLEMS  *BOWEL PROBLEMS  UNUSUAL RASH Items with * indicate a potential emergency and should be followed up as soon as possible.  Feel free to call the clinic you have any questions or concerns. The clinic phone number is [phone].  Please show the CHEMO ALERT CARD at check-in to the Emergency Department and triage nurse.   

## 2016-08-21 ENCOUNTER — Other Ambulatory Visit: Payer: BLUE CROSS/BLUE SHIELD

## 2016-08-22 ENCOUNTER — Other Ambulatory Visit: Payer: Self-pay

## 2016-08-22 DIAGNOSIS — C50411 Malignant neoplasm of upper-outer quadrant of right female breast: Secondary | ICD-10-CM

## 2016-08-27 ENCOUNTER — Telehealth: Payer: Self-pay | Admitting: Hematology and Oncology

## 2016-08-27 NOTE — Telephone Encounter (Signed)
lvm to inform pt of echo 9/29 at [facility] per LOS

## 2016-08-29 ENCOUNTER — Ambulatory Visit (HOSPITAL_COMMUNITY): Admission: RE | Admit: 2016-08-29 | Payer: BLUE CROSS/BLUE SHIELD | Source: Ambulatory Visit

## 2016-09-03 ENCOUNTER — Ambulatory Visit (HOSPITAL_COMMUNITY)
Admission: RE | Admit: 2016-09-03 | Discharge: 2016-09-03 | Disposition: A | Discharge status: Home / Self Care | Payer: BLUE CROSS/BLUE SHIELD | Source: Ambulatory Visit | Attending: Hematology and Oncology | Admitting: Hematology and Oncology

## 2016-09-03 DIAGNOSIS — C50411 Malignant neoplasm of upper-outer quadrant of right female breast: Secondary | ICD-10-CM

## 2016-09-03 NOTE — Progress Notes (Signed)
*  PRELIMINARY RESULTS* Echocardiogram 2D Echocardiogram has been performed.  Lori Dodson 09/03/2016, 11:59 AM

## 2016-09-09 ENCOUNTER — Encounter: Payer: Self-pay | Admitting: Hematology and Oncology

## 2016-09-09 ENCOUNTER — Ambulatory Visit (HOSPITAL_BASED_OUTPATIENT_CLINIC_OR_DEPARTMENT_OTHER): Payer: BLUE CROSS/BLUE SHIELD

## 2016-09-09 ENCOUNTER — Ambulatory Visit (HOSPITAL_BASED_OUTPATIENT_CLINIC_OR_DEPARTMENT_OTHER): Payer: BLUE CROSS/BLUE SHIELD | Admitting: Hematology and Oncology

## 2016-09-09 ENCOUNTER — Other Ambulatory Visit (HOSPITAL_BASED_OUTPATIENT_CLINIC_OR_DEPARTMENT_OTHER): Payer: BLUE CROSS/BLUE SHIELD

## 2016-09-09 ENCOUNTER — Encounter: Payer: Self-pay | Admitting: *Deleted

## 2016-09-09 DIAGNOSIS — Z170 Estrogen receptor positive status [ER+]: Secondary | ICD-10-CM

## 2016-09-09 DIAGNOSIS — Z79810 Long term (current) use of selective estrogen receptor modulators (SERMs): Secondary | ICD-10-CM

## 2016-09-09 DIAGNOSIS — C50411 Malignant neoplasm of upper-outer quadrant of right female breast: Secondary | ICD-10-CM

## 2016-09-09 DIAGNOSIS — R635 Abnormal weight gain: Secondary | ICD-10-CM | POA: Diagnosis not present

## 2016-09-09 DIAGNOSIS — Z5112 Encounter for antineoplastic immunotherapy: Secondary | ICD-10-CM

## 2016-09-09 LAB — COMPREHENSIVE METABOLIC PANEL
ALBUMIN: 3.3 g/dL — AB (ref 3.5–5.0)
ALK PHOS: 47 U/L (ref 40–150)
ALT: 10 U/L (ref 0–55)
AST: 13 U/L (ref 5–34)
Anion Gap: 9 mEq/L (ref 3–11)
BILIRUBIN TOTAL: 0.68 mg/dL (ref 0.20–1.20)
BUN: 11.7 mg/dL (ref 7.0–26.0)
CALCIUM: 8.6 mg/dL (ref 8.4–10.4)
CO2: 25 mEq/L (ref 22–29)
Chloride: 109 mEq/L (ref 98–109)
Creatinine: 0.9 mg/dL (ref 0.6–1.1)
GLUCOSE: 109 mg/dL (ref 70–140)
Potassium: 3.9 mEq/L (ref 3.5–5.1)
SODIUM: 144 meq/L (ref 136–145)
TOTAL PROTEIN: 6.2 g/dL — AB (ref 6.4–8.3)

## 2016-09-09 LAB — CBC WITH DIFFERENTIAL/PLATELET
BASO%: 0.5 % (ref 0.0–2.0)
Basophils Absolute: 0 10*3/uL (ref 0.0–0.1)
EOS ABS: 0.2 10*3/uL (ref 0.0–0.5)
EOS%: 2.4 % (ref 0.0–7.0)
HEMATOCRIT: 38.3 % (ref 34.8–46.6)
HEMOGLOBIN: 13 g/dL (ref 11.6–15.9)
LYMPH%: 35.6 % (ref 14.0–49.7)
MCH: 29.4 pg (ref 25.1–34.0)
MCHC: 33.9 g/dL (ref 31.5–36.0)
MCV: 86.7 fL (ref 79.5–101.0)
MONO#: 0.5 10*3/uL (ref 0.1–0.9)
MONO%: 5.4 % (ref 0.0–14.0)
NEUT%: 56.1 % (ref 38.4–76.8)
NEUTROS ABS: 4.8 10*3/uL (ref 1.5–6.5)
Platelets: 220 10*3/uL (ref 145–400)
RBC: 4.42 10*6/uL (ref 3.70–5.45)
RDW: 13.1 % (ref 11.2–14.5)
WBC: 8.6 10*3/uL (ref 3.9–10.3)
lymph#: 3.1 10*3/uL (ref 0.9–3.3)

## 2016-09-09 MED ORDER — SODIUM CHLORIDE 0.9 % IV SOLN
Freq: Once | INTRAVENOUS | Status: AC
  Administered 2016-09-09: 10:00:00 via INTRAVENOUS

## 2016-09-09 MED ORDER — ACETAMINOPHEN 325 MG PO TABS
650.0000 mg | ORAL_TABLET | Freq: Once | ORAL | Status: AC
  Administered 2016-09-09: 650 mg via ORAL

## 2016-09-09 MED ORDER — TRASTUZUMAB CHEMO 150 MG IV SOLR
6.0000 mg/kg | Freq: Once | INTRAVENOUS | Status: AC
  Administered 2016-09-09: 462 mg via INTRAVENOUS
  Filled 2016-09-09: qty 22

## 2016-09-09 MED ORDER — SODIUM CHLORIDE 0.9% FLUSH
10.0000 mL | INTRAVENOUS | Status: DC | PRN
  Administered 2016-09-09: 10 mL
  Filled 2016-09-09: qty 10

## 2016-09-09 MED ORDER — HEPARIN SOD (PORK) LOCK FLUSH 100 UNIT/ML IV SOLN
500.0000 [IU] | Freq: Once | INTRAVENOUS | Status: AC | PRN
  Administered 2016-09-09: 500 [IU]
  Filled 2016-09-09: qty 5

## 2016-09-09 MED ORDER — ACETAMINOPHEN 325 MG PO TABS
ORAL_TABLET | ORAL | Status: AC
  Filled 2016-09-09: qty 2

## 2016-09-09 NOTE — Assessment & Plan Note (Signed)
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 Adjuvant Anti estrogen therapy with Tamoxifen 20 mg daily X 10 years started 02/18/2016 2. Continue with Herceptin maintenance every 3 weeks until October 2017. --------------------------------------------------------------------------------------------------------- Tamoxifen toxicities: 1. Hot flashes: They have improved over time. 2. Weight gain: she is doing different things to help her lose weight. She is exercising and watching her diet Her plan is to come down below 165 pounds in 6 weeks.  Return to clinic every 3 weeks with Herceptin and received 6 weeks for follow-up

## 2016-09-09 NOTE — Patient Instructions (Signed)
[facility] Discharge Instructions for Patients Receiving Chemotherapy  Today you received the following chemotherapy agents:  Herceptin  To help prevent nausea and vomiting after your treatment, we encourage you to take your nausea medication as prescribed.   If you develop nausea and vomiting that is not controlled by your nausea medication, call the clinic.   BELOW ARE SYMPTOMS THAT SHOULD BE REPORTED IMMEDIATELY:  *FEVER GREATER THAN 100.5 F  *CHILLS WITH OR WITHOUT FEVER  NAUSEA AND VOMITING THAT IS NOT CONTROLLED WITH YOUR NAUSEA MEDICATION  *UNUSUAL SHORTNESS OF BREATH  *UNUSUAL BRUISING OR BLEEDING  TENDERNESS IN MOUTH AND THROAT WITH OR WITHOUT PRESENCE OF ULCERS  *URINARY PROBLEMS  *BOWEL PROBLEMS  UNUSUAL RASH Items with * indicate a potential emergency and should be followed up as soon as possible.  Feel free to call the clinic you have any questions or concerns. The clinic phone number is [phone].  Please show the CHEMO ALERT CARD at check-in to the Emergency Department and triage nurse.   

## 2016-09-09 NOTE — Progress Notes (Signed)
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      02/18/2016 -  Anti-estrogen oral therapy    Tamoxifen 20 mg daily       CHIEF COMPLIANT: last Herceptin today  INTERVAL HISTORY: Lori Dodson is a 30 year old with above-mentioned history of right breast cancer currently on adjuvant Herceptin . Today is the last of her Herceptin treatments. She is also been on tamoxifen in appearance with tolerating it extremely well. She does have occasional hot flashes. But they have improved significantly from before.she went on a cruise and gained some weight recently.  REVIEW OF SYSTEMS:   Constitutional: Denies fevers, chills or abnormal weight loss Eyes: Denies blurriness of vision Ears, nose, mouth, throat, and face: Denies mucositis or sore throat Respiratory: Denies cough, dyspnea or wheezes Cardiovascular: Denies palpitation, chest discomfort Gastrointestinal:  Denies nausea,  heartburn or change in bowel habits Skin: Denies abnormal skin rashes Lymphatics: Denies new lymphadenopathy or easy bruising Neurological:Denies numbness, tingling or new weaknesses Behavioral/Psych: Mood is stable, no new changes  Extremities: No lower extremity edema Breast:  denies any pain or lumps or nodules in either breasts All other systems were reviewed with the patient and are negative.  I have reviewed the past medical history, past surgical history, social history and family history with the patient and they are unchanged from previous note.  ALLERGIES:  is allergic to latex.  MEDICATIONS:  Current Outpatient Prescriptions  Medication Sig Dispense Refill  . lidocaine-prilocaine (EMLA) cream Apply to affected area once 30 g 3  . tamoxifen (NOLVADEX) 20 MG tablet Take 1 tablet (20 mg total) by mouth daily. 90 tablet 3   No current facility-administered medications for this visit.     PHYSICAL EXAMINATION: ECOG PERFORMANCE STATUS: 1 - Symptomatic but completely ambulatory  Vitals:   09/09/16 0929  BP: (!) 110/94  Pulse: 65  Resp: 18  Temp: 98.2 F (36.8 C)   Filed Weights   09/09/16 0929  Weight: 168 lb 9.6 oz (76.5 kg)    GENERAL:alert, no distress and comfortable SKIN: skin color, texture, turgor are normal, no rashes or significant lesions EYES: normal, Conjunctiva are pink and non-injected, sclera clear OROPHARYNX:no exudate, no erythema and lips, buccal mucosa, and tongue normal  NECK: supple, thyroid normal size, non-tender, without nodularity LYMPH:  no palpable lymphadenopathy in the cervical, axillary or inguinal LUNGS: clear to auscultation  and percussion with normal breathing effort HEART: regular rate & rhythm and no murmurs and no lower extremity edema ABDOMEN:abdomen soft, non-tender and normal bowel sounds MUSCULOSKELETAL:no cyanosis of digits and no clubbing  NEURO: alert & oriented x 3 with fluent speech, no focal motor/sensory  deficits EXTREMITIES: No lower extremity edema BREAST: No palpable masses or nodules in either right or left breasts. No palpable axillary supraclavicular or infraclavicular adenopathy no breast tenderness or nipple discharge. (exam performed in the presence of a chaperone)  LABORATORY DATA:  I have reviewed the data as listed   Chemistry      Component Value Date/Time   NA 144 09/09/2016 0910   K 3.9 09/09/2016 0910   CL 109 02/05/2016 1032   CO2 25 09/09/2016 0910   BUN 11.7 09/09/2016 0910   CREATININE 0.9 09/09/2016 0910      Component Value Date/Time   CALCIUM 8.6 09/09/2016 0910   ALKPHOS 47 09/09/2016 0910   AST 13 09/09/2016 0910   ALT 10 09/09/2016 0910   BILITOT 0.68 09/09/2016 0910       Lab Results  Component Value Date   WBC 8.6 09/09/2016   HGB 13.0 09/09/2016   HCT 38.3 09/09/2016   MCV 86.7 09/09/2016   PLT 220 09/09/2016   NEUTROABS 4.8 09/09/2016     ASSESSMENT & PLAN:  Breast cancer of upper-outer quadrant of right female breast ([place]) 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 Adjuvant Anti estrogen therapy with Tamoxifen 20 mg daily X 10 years started 02/18/2016 2. Continue with Herceptin maintenance every 3 weeks until October 2017. --------------------------------------------------------------------------------------------------------- Tamoxifen toxicities: 1. Hot flashes: They have  improved over time. 2. Weight gain: she lost significant weight but then she gained it back on her recent cruise trip. She is planning to lose more weight again and we will see her back in 6 months..  I discussed with her extensively about Neratinib and the improvement in disease-free survival by 2-4%. She is not very keen on taking it. I discussed with her that we can consider this even 6 months later. I will discuss this again with her next time when she comes back to  Orders Placed This Encounter  Procedures  . MM DIAG BREAST TOMO BILATERAL    Standing Status:   Future    Standing Expiration Date:   11/09/2017    Order Specific Question:   Reason for Exam (SYMPTOM  OR DIAGNOSIS REQUIRED)    Answer:   Annual bilateral diagnostic mammograms for breast cancer right breast    Order Specific Question:   Is the patient pregnant?    Answer:   No    Order Specific Question:   Preferred imaging location?    Answer:   [facility]   The patient has a good understanding of the overall plan. she agrees with it. she will call with any problems that may develop before the next visit here.   Rulon Eisenmenger, MD 09/09/16

## 2016-09-10 ENCOUNTER — Ambulatory Visit: Payer: Self-pay | Admitting: General Surgery

## 2016-09-10 ENCOUNTER — Other Ambulatory Visit: Payer: Self-pay | Admitting: General Surgery

## 2016-09-16 ENCOUNTER — Telehealth: Payer: Self-pay | Admitting: *Deleted

## 2016-09-16 NOTE — Telephone Encounter (Signed)
  Oncology Nurse Navigator Documentation  Navigator Location: [facility]Med Onc (09/16/16 1400) Navigator Encounter Type: Treatment (09/16/16 1400)           Patient Visit Type: MedOnc (09/16/16 1400) Treatment Phase: Final Chemo TX (09/16/16 1400)                            Time Spent with Patient: 15 (09/16/16 1400)

## 2016-09-30 ENCOUNTER — Other Ambulatory Visit: Payer: BLUE CROSS/BLUE SHIELD

## 2016-09-30 ENCOUNTER — Ambulatory Visit: Payer: BLUE CROSS/BLUE SHIELD

## 2016-10-09 ENCOUNTER — Ambulatory Visit
Admission: RE | Admit: 2016-10-09 | Discharge: 2016-10-09 | Disposition: A | Discharge status: Home / Self Care | Payer: BLUE CROSS/BLUE SHIELD | Source: Ambulatory Visit | Attending: Hematology and Oncology | Admitting: Hematology and Oncology

## 2016-10-09 ENCOUNTER — Other Ambulatory Visit: Payer: Self-pay | Admitting: Hematology and Oncology

## 2016-10-09 DIAGNOSIS — Z170 Estrogen receptor positive status [ER+]: Principal | ICD-10-CM

## 2016-10-09 DIAGNOSIS — C50411 Malignant neoplasm of upper-outer quadrant of right female breast: Secondary | ICD-10-CM

## 2016-10-13 ENCOUNTER — Encounter (HOSPITAL_BASED_OUTPATIENT_CLINIC_OR_DEPARTMENT_OTHER): Payer: Self-pay | Admitting: *Deleted

## 2016-10-17 ENCOUNTER — Ambulatory Visit (HOSPITAL_BASED_OUTPATIENT_CLINIC_OR_DEPARTMENT_OTHER): Payer: BLUE CROSS/BLUE SHIELD | Admitting: Anesthesiology

## 2016-10-17 ENCOUNTER — Encounter (HOSPITAL_BASED_OUTPATIENT_CLINIC_OR_DEPARTMENT_OTHER): Payer: Self-pay | Admitting: *Deleted

## 2016-10-17 ENCOUNTER — Ambulatory Visit (HOSPITAL_BASED_OUTPATIENT_CLINIC_OR_DEPARTMENT_OTHER)
Admission: RE | Admit: 2016-10-17 | Discharge: 2016-10-17 | Disposition: A | Discharge status: Home / Self Care | Payer: BLUE CROSS/BLUE SHIELD | Source: Ambulatory Visit | Attending: General Surgery | Admitting: General Surgery

## 2016-10-17 ENCOUNTER — Encounter (HOSPITAL_BASED_OUTPATIENT_CLINIC_OR_DEPARTMENT_OTHER)
Admission: RE | Disposition: A | Discharge status: Home / Self Care | Payer: Self-pay | Source: Ambulatory Visit | Attending: General Surgery

## 2016-10-17 DIAGNOSIS — Z79810 Long term (current) use of selective estrogen receptor modulators (SERMs): Secondary | ICD-10-CM | POA: Diagnosis not present

## 2016-10-17 DIAGNOSIS — Z853 Personal history of malignant neoplasm of breast: Secondary | ICD-10-CM | POA: Insufficient documentation

## 2016-10-17 DIAGNOSIS — Z9221 Personal history of antineoplastic chemotherapy: Secondary | ICD-10-CM | POA: Diagnosis not present

## 2016-10-17 DIAGNOSIS — Z9011 Acquired absence of right breast and nipple: Secondary | ICD-10-CM | POA: Diagnosis not present

## 2016-10-17 DIAGNOSIS — Z452 Encounter for adjustment and management of vascular access device: Secondary | ICD-10-CM | POA: Diagnosis present

## 2016-10-17 HISTORY — PX: PORT-A-CATH REMOVAL: SHX5289

## 2016-10-17 SURGERY — REMOVAL PORT-A-CATH
Anesthesia: Monitor Anesthesia Care | Site: Chest

## 2016-10-17 MED ORDER — PROMETHAZINE HCL 25 MG/ML IJ SOLN: 6.2500 mg | INTRAMUSCULAR | Status: DC | PRN

## 2016-10-17 MED ORDER — FENTANYL CITRATE (PF) 100 MCG/2ML IJ SOLN
25.0000 ug | INTRAMUSCULAR | Status: DC | PRN
  Administered 2016-10-17 (×2): 25 ug via INTRAVENOUS

## 2016-10-17 MED ORDER — MIDAZOLAM HCL 2 MG/2ML IJ SOLN: 0.5000 mg | Freq: Once | INTRAMUSCULAR | Status: DC | PRN

## 2016-10-17 MED ORDER — FENTANYL CITRATE (PF) 100 MCG/2ML IJ SOLN
INTRAMUSCULAR | Status: AC
  Filled 2016-10-17: qty 2

## 2016-10-17 MED ORDER — BUPIVACAINE HCL (PF) 0.25 % IJ SOLN
INTRAMUSCULAR | Status: DC | PRN
  Administered 2016-10-17: 5 mL

## 2016-10-17 MED ORDER — ONDANSETRON HCL 4 MG/2ML IJ SOLN
INTRAMUSCULAR | Status: DC | PRN
  Administered 2016-10-17: 4 mg via INTRAVENOUS

## 2016-10-17 MED ORDER — MEPERIDINE HCL 25 MG/ML IJ SOLN: 6.2500 mg | INTRAMUSCULAR | Status: DC | PRN

## 2016-10-17 MED ORDER — LACTATED RINGERS IV SOLN
INTRAVENOUS | Status: DC
  Administered 2016-10-17: 10:00:00 via INTRAVENOUS

## 2016-10-17 MED ORDER — CHLORHEXIDINE GLUCONATE CLOTH 2 % EX PADS: 6.0000 | MEDICATED_PAD | Freq: Once | CUTANEOUS | Status: DC

## 2016-10-17 MED ORDER — MIDAZOLAM HCL 2 MG/2ML IJ SOLN
1.0000 mg | INTRAMUSCULAR | Status: DC | PRN
  Administered 2016-10-17 (×2): 1 mg via INTRAVENOUS

## 2016-10-17 MED ORDER — DEXAMETHASONE SODIUM PHOSPHATE 10 MG/ML IJ SOLN
INTRAMUSCULAR | Status: AC
  Filled 2016-10-17: qty 1

## 2016-10-17 MED ORDER — LIDOCAINE-EPINEPHRINE 1 %-1:100000 IJ SOLN
INTRAMUSCULAR | Status: DC | PRN
  Administered 2016-10-17: 5 mL

## 2016-10-17 MED ORDER — PROPOFOL 10 MG/ML IV BOLUS
INTRAVENOUS | Status: DC | PRN
  Administered 2016-10-17: 50 mg via INTRAVENOUS
  Administered 2016-10-17: 20 mg via INTRAVENOUS

## 2016-10-17 MED ORDER — DEXAMETHASONE SODIUM PHOSPHATE 4 MG/ML IJ SOLN
INTRAMUSCULAR | Status: DC | PRN
  Administered 2016-10-17: 10 mg via INTRAVENOUS

## 2016-10-17 MED ORDER — ONDANSETRON HCL 4 MG/2ML IJ SOLN
INTRAMUSCULAR | Status: AC
  Filled 2016-10-17: qty 2

## 2016-10-17 MED ORDER — LIDOCAINE 2% (20 MG/ML) 5 ML SYRINGE
INTRAMUSCULAR | Status: AC
  Filled 2016-10-17: qty 5

## 2016-10-17 MED ORDER — HYDROCODONE-ACETAMINOPHEN 5-325 MG PO TABS: 1.0000 | ORAL_TABLET | ORAL | 0 refills | Status: DC | PRN

## 2016-10-17 MED ORDER — PROPOFOL 10 MG/ML IV BOLUS
INTRAVENOUS | Status: AC
  Filled 2016-10-17: qty 20

## 2016-10-17 MED ORDER — SCOPOLAMINE 1 MG/3DAYS TD PT72: 1.0000 | MEDICATED_PATCH | Freq: Once | TRANSDERMAL | Status: DC | PRN

## 2016-10-17 MED ORDER — LIDOCAINE 2% (20 MG/ML) 5 ML SYRINGE
INTRAMUSCULAR | Status: DC | PRN
  Administered 2016-10-17: 40 mg via INTRAVENOUS

## 2016-10-17 MED ORDER — FENTANYL CITRATE (PF) 100 MCG/2ML IJ SOLN
50.0000 ug | INTRAMUSCULAR | Status: DC | PRN
  Administered 2016-10-17: 50 ug via INTRAVENOUS

## 2016-10-17 MED ORDER — LIDOCAINE-EPINEPHRINE 1 %-1:100000 IJ SOLN
INTRAMUSCULAR | Status: AC
  Filled 2016-10-17: qty 1

## 2016-10-17 MED ORDER — MIDAZOLAM HCL 2 MG/2ML IJ SOLN
INTRAMUSCULAR | Status: AC
  Filled 2016-10-17: qty 2

## 2016-10-17 MED ORDER — BUPIVACAINE HCL (PF) 0.25 % IJ SOLN
INTRAMUSCULAR | Status: AC
  Filled 2016-10-17: qty 30

## 2016-10-17 MED ORDER — DIPHENHYDRAMINE HCL 50 MG/ML IJ SOLN
INTRAMUSCULAR | Status: DC | PRN
  Administered 2016-10-17 (×2): 12.5 mg via INTRAVENOUS

## 2016-10-17 MED ORDER — LIDOCAINE HCL (PF) 1 % IJ SOLN
INTRAMUSCULAR | Status: AC
  Filled 2016-10-17: qty 30

## 2016-10-17 MED ORDER — GLYCOPYRROLATE 0.2 MG/ML IV SOSY
PREFILLED_SYRINGE | INTRAVENOUS | Status: DC | PRN
  Administered 2016-10-17: .2 mg via INTRAVENOUS

## 2016-10-17 SURGICAL SUPPLY — 29 items
BLADE SURG 15 STRL LF DISP TIS (BLADE) ×1 IMPLANT
BLADE SURG 15 STRL SS (BLADE) ×2
CHLORAPREP W/TINT 26ML (MISCELLANEOUS) ×3 IMPLANT
COVER BACK TABLE 60X90IN (DRAPES) ×3 IMPLANT
COVER MAYO STAND STRL (DRAPES) ×3 IMPLANT
DECANTER SPIKE VIAL GLASS SM (MISCELLANEOUS) ×3 IMPLANT
DERMABOND ADVANCED (GAUZE/BANDAGES/DRESSINGS) ×2
DERMABOND ADVANCED .7 DNX12 (GAUZE/BANDAGES/DRESSINGS) ×1 IMPLANT
DRAPE LAPAROTOMY 100X72 PEDS (DRAPES) ×3 IMPLANT
DRAPE UTILITY XL STRL (DRAPES) ×3 IMPLANT
ELECT COATED BLADE 2.86 ST (ELECTRODE) ×3 IMPLANT
ELECT REM PT RETURN 9FT ADLT (ELECTROSURGICAL) ×3
ELECTRODE REM PT RTRN 9FT ADLT (ELECTROSURGICAL) ×1 IMPLANT
GLOVE BIO SURGEON STRL SZ7.5 (GLOVE) IMPLANT
GLOVE BIOGEL PI IND STRL 8 (GLOVE) ×1 IMPLANT
GLOVE BIOGEL PI INDICATOR 8 (GLOVE) ×2
GLOVE SURG SS PI 7.5 STRL IVOR (GLOVE) ×6 IMPLANT
GOWN STRL REUS W/ TWL LRG LVL3 (GOWN DISPOSABLE) ×1 IMPLANT
GOWN STRL REUS W/TWL LRG LVL3 (GOWN DISPOSABLE) ×2
NEEDLE HYPO 25X1 1.5 SAFETY (NEEDLE) ×3 IMPLANT
PACK BASIN DAY SURGERY FS (CUSTOM PROCEDURE TRAY) ×3 IMPLANT
PENCIL BUTTON HOLSTER BLD 10FT (ELECTRODE) ×3 IMPLANT
SLEEVE SCD COMPRESS KNEE MED (MISCELLANEOUS) ×3 IMPLANT
SUT MON AB 4-0 PC3 18 (SUTURE) ×3 IMPLANT
SUT VIC AB 3-0 SH 27 (SUTURE) ×2
SUT VIC AB 3-0 SH 27X BRD (SUTURE) ×1 IMPLANT
SYR CONTROL 10ML LL (SYRINGE) ×3 IMPLANT
TOWEL OR 17X24 6PK STRL BLUE (TOWEL DISPOSABLE) ×3 IMPLANT
TOWEL OR NON WOVEN STRL DISP B (DISPOSABLE) ×3 IMPLANT

## 2016-10-17 NOTE — Discharge Instructions (Signed)

## 2016-10-17 NOTE — Interval H&P Note (Signed)
History and Physical Interval Note:  10/17/2016 10:59 AM  Lori Dodson  has presented today for surgery, with the diagnosis of right breast cancer  The various methods of treatment have been discussed with the patient and family. After consideration of risks, benefits and other options for treatment, the patient has consented to  Procedure(s): REMOVAL PORT-A-CATH (N/A) as a surgical intervention .  The patient's history has been reviewed, patient examined, no change in status, stable for surgery.  I have reviewed the patient's chart and labs.  Questions were answered to the patient's satisfaction.     TOTH III,Clydean Posas S

## 2016-10-17 NOTE — Anesthesia Postprocedure Evaluation (Signed)
Anesthesia Post Note  Patient: Lori Dodson  Procedure(s) Performed: Procedure(s) (LRB): REMOVAL PORT-A-CATH (N/A)  Patient location during evaluation: PACU Anesthesia Type: MAC Level of consciousness: awake and alert Pain management: pain level controlled Vital Signs Assessment: post-procedure vital signs reviewed and stable Respiratory status: spontaneous breathing, nonlabored ventilation, respiratory function stable and patient connected to nasal cannula oxygen Cardiovascular status: stable and blood pressure returned to baseline Anesthetic complications: no    Last Vitals:  Vitals:   10/17/16 1229 10/17/16 1230  BP:  101/67  Pulse: (!) 56 (!) 58  Resp: 12 15  Temp:      Last Pain:  Vitals:   10/17/16 1230  TempSrc:   PainSc: [place]

## 2016-10-17 NOTE — Op Note (Signed)
10/17/2016  11:30 AM  PATIENT:  Lori Dodson  30 y.o. female  PRE-OPERATIVE DIAGNOSIS:  Right breast cancer  POST-OPERATIVE DIAGNOSIS:  Right breast cancer  PROCEDURE:  Procedure(s): REMOVAL PORT-A-CATH (N/A)  SURGEON:  Surgeon(s) and Role:    * Jovita Kussmaul, MD - Primary  PHYSICIAN ASSISTANT:   ASSISTANTS: none   ANESTHESIA:   local and IV sedation  EBL:  No intake/output data recorded.  BLOOD ADMINISTERED:none  DRAINS: none   LOCAL MEDICATIONS USED:  MARCAINE    and LIDOCAINE   SPECIMEN:  No Specimen  DISPOSITION OF SPECIMEN:  N/A  COUNTS:  YES  TOURNIQUET:  * No tourniquets in log *  DICTATION: .Dragon Dictation   After informed consent was obtained the patient was brought to the operating room and placed in the supine position on the operating room table. After adequate IV sedation had been given the patient's left chest wall was prepped with Betadine and draped in usual sterile manner. An appropriate timeout was performed. The area around her port was infiltrated with 1% lidocaine with epinephrine and quarter percent Marcaine. Once a good field block was created a small incision was made through her previous incision with a 15 blade knife. The incision was carried through the subcutaneous tissue sharply with the 15 blade knife until the port was identified. The capsule surrounding the port was opened sharply with the 15 blade knife. With blunt hemostat dissection the port was separated from the pocket and gently pushed out of the pocket. With gentle traction the port was removed from the patient without difficulty. Pressure was held for several minutes until the area was completely hemostatic. The deep layer of the wound was then closed with interrupted 3-0 Vicryl stitches. The skin was then closed with a running 4-0 Monocryl subcuticular stitch. Dermabond dressings were applied. The patient tolerated the procedure well. At the end of the case all needle sponge and  instrument counts were correct. The patient was then awakened and taken to recovery in stable condition.  PLAN OF CARE: Discharge to home after PACU  PATIENT DISPOSITION:  PACU - hemodynamically stable.   Delay start of Pharmacological VTE agent (>24hrs) due to surgical blood loss or risk of bleeding: not applicable

## 2016-10-17 NOTE — Anesthesia Preprocedure Evaluation (Addendum)
Anesthesia Evaluation  Patient identified by MRN, date of birth, ID band Patient awake    Reviewed: Allergy & Precautions, NPO status , Patient's Chart, lab work & pertinent test results  History of Anesthesia Complications Negative for: history of anesthetic complications  Airway Mallampati: II  TM Distance: >3 FB Neck ROM: Full    Dental  (+) Dental Advisory Given   Pulmonary neg pulmonary ROS,    breath sounds clear to auscultation       Cardiovascular negative cardio ROS   Rhythm:Regular Rate:Normal  10/17 ECHO: EF 55-60%, valves OK   Neuro/Psych negative neurological ROS     GI/Hepatic negative GI ROS, Neg liver ROS,   Endo/Other  negative endocrine ROS  Renal/GU negative Renal ROS     Musculoskeletal   Abdominal   Peds  Hematology negative hematology ROS (+)   Anesthesia Other Findings Breast cancer: mastectomy and Chemo  Reproductive/Obstetrics                            Anesthesia Physical Anesthesia Plan  ASA: II  Anesthesia Plan: MAC   Post-op Pain Management:    Induction: Intravenous  Airway Management Planned: Natural Airway and Nasal Cannula  Additional Equipment:   Intra-op Plan:   Post-operative Plan:   Informed Consent: I have reviewed the patients History and Physical, chart, labs and discussed the procedure including the risks, benefits and alternatives for the proposed anesthesia with the patient or authorized representative who has indicated his/her understanding and acceptance.   Dental advisory given  Plan Discussed with: CRNA and Surgeon  Anesthesia Plan Comments: (Plan routine monitors, MAC)        Anesthesia Quick Evaluation

## 2016-10-17 NOTE — Transfer of Care (Signed)
Immediate Anesthesia Transfer of Care Note  Patient: Lori Dodson  Procedure(s) Performed: Procedure(s): REMOVAL PORT-A-CATH (N/A)  Patient Location: PACU  Anesthesia Type:MAC  Level of Consciousness: sedated, patient cooperative and lethargic  Airway & Oxygen Therapy: Patient Spontanous Breathing and Patient connected to face mask oxygen  Post-op Assessment: Report given to RN and Post -op Vital signs reviewed and stable  Post vital signs: Reviewed and stable  Last Vitals:  Vitals:   10/17/16 0959 10/17/16 1009  BP: (!) 115/58 (!) 116/59  Pulse: 60 63  Resp: 18 16  Temp: 36.8 C 36.6 C    Last Pain:  Vitals:   10/17/16 1009  TempSrc: Oral         Complications: No apparent anesthesia complications

## 2016-10-17 NOTE — H&P (Signed)
  Lori Dodson  Location: [facility] Patient #: [id number] DOB: 31-May-1986 Married / Language: English / Race: Black or African American Female   History of Present Illness Patient words: masty check.  The patient is a 30 year old female who presents for a follow-up for Breast cancer. The patient is a 30 year old white female who is about 7 weeks status post right modified radical mastectomy for breast cancer. Her original tumor measured about 10 cm. She underwent neoadjuvant therapy and had a complete pathologic response. Her course has been complicated by a persistent seroma beneath the skin flaps.   Problem List/Past Medical  PRIMARY CANCER OF LOWER OUTER QUADRANT OF RIGHT FEMALE BREAST (C50.511)  Other Problems  Breast Cancer Lump In Breast  Past Surgical History  No pertinent past surgical history  Diagnostic Studies History  Colonoscopy never Mammogram within last year Pap Smear 1-5 years ago  Allergies  Latex Exam Gloves *MEDICAL DEVICES AND SUPPLIES* No Known Drug Allergies04/05/2016 (Marked as Inactive)  Medication History  No Current Medications (Taken starting 01/14/2016) Medications Reconciled  Social History  Alcohol use Occasional alcohol use. Caffeine use Tea. No drug use Tobacco use Never smoker.  Family History  Diabetes Mellitus Mother.  Pregnancy / Birth History Age at menarche 45 years. Contraceptive History Oral contraceptives. Gravida 5 Maternal age 58-20 Para 2 Regular periods  Vitals  Weight: 167.38 lb Height: 63in Body Surface Area: 1.79 m Body Mass Index: 29.65 kg/m  BP: 108/70 (Sitting, Left Arm, Standard)       Physical Exam  Breast Note: The right mastectomy incision has healed nicely with no sign of infection. The skin flaps are healthy. There is a small seroma underneath the skin flaps. The axillary area was prepped with ChloraPrep and infiltrated with 1% lidocaine. 30 cc of  serous fluid was aspirated and she tolerated this well.     Assessment & Plan  PRIMARY CANCER OF LOWER OUTER QUADRANT OF RIGHT FEMALE BREAST (C50.511) Impression: The patient is about 7 weeks status post right modified radical mastectomy for breast cancer. She received neoadjuvant therapy and had a complete pathologic response. She has had a persistent seroma beneath the mastectomy skin flaps. I was able to aspirate 30 cc of serous fluid today and she tolerated this well. I will plan to see her back in about 2 weeks. Current Plans Follow up with [place] in the office in 2 weeks.  Call us sooner as needed.  She presents today for removal of her port. Risks and benefits of the surgery as well as some of the technical aspects discussed with the patient and she understands and wishes to proceed.

## 2016-10-20 ENCOUNTER — Encounter (HOSPITAL_BASED_OUTPATIENT_CLINIC_OR_DEPARTMENT_OTHER): Payer: Self-pay | Admitting: General Surgery

## 2016-10-22 ENCOUNTER — Encounter (HOSPITAL_BASED_OUTPATIENT_CLINIC_OR_DEPARTMENT_OTHER): Payer: Self-pay | Admitting: General Surgery

## 2016-11-26 ENCOUNTER — Encounter: Payer: BLUE CROSS/BLUE SHIELD | Admitting: Adult Health

## 2016-12-18 ENCOUNTER — Encounter: Payer: BLUE CROSS/BLUE SHIELD | Admitting: Adult Health

## 2017-02-08 ENCOUNTER — Telehealth: Payer: Self-pay

## 2017-02-08 NOTE — Telephone Encounter (Signed)
Called and left a message with a new appt date and time  Lori Dodson

## 2017-03-01 ENCOUNTER — Other Ambulatory Visit: Payer: Self-pay | Admitting: Hematology and Oncology

## 2017-03-01 DIAGNOSIS — C50411 Malignant neoplasm of upper-outer quadrant of right female breast: Secondary | ICD-10-CM

## 2017-03-10 ENCOUNTER — Ambulatory Visit: Payer: BLUE CROSS/BLUE SHIELD | Admitting: Hematology and Oncology

## 2017-03-19 ENCOUNTER — Ambulatory Visit: Payer: BLUE CROSS/BLUE SHIELD | Admitting: Hematology and Oncology

## 2017-03-19 NOTE — Assessment & Plan Note (Deleted)
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 Adjuvant Anti estrogen therapy with Tamoxifen 20 mg daily X 10 years started 02/18/2016 2. Herceptin maintenance every 3 weeks until October 2017. --------------------------------------------------------------------------------------------------------- Tamoxifen toxicities: 1. Hot flashes: They have improved over time. 2. Weight gain: she lost significant weight but then she gained it back on her recent cruise trip. She is planning to lose more weight again.  I discussed with her extensively about Neratinib and the improvement in disease-free survival by 2-4%. She is not very keen on taking it. I discussed with her that we can consider this even 6 months later.

## 2017-03-25 ENCOUNTER — Telehealth: Payer: Self-pay

## 2017-03-25 NOTE — Telephone Encounter (Signed)
Called pt. and lvm with call back number. Pt wants to reschedule appt. Told pt to call back and we can reschedule her soon.

## 2017-03-31 ENCOUNTER — Encounter: Payer: Self-pay | Admitting: Hematology and Oncology

## 2017-03-31 ENCOUNTER — Ambulatory Visit (HOSPITAL_BASED_OUTPATIENT_CLINIC_OR_DEPARTMENT_OTHER): Payer: BLUE CROSS/BLUE SHIELD | Admitting: Hematology and Oncology

## 2017-03-31 DIAGNOSIS — C50411 Malignant neoplasm of upper-outer quadrant of right female breast: Secondary | ICD-10-CM | POA: Diagnosis not present

## 2017-03-31 DIAGNOSIS — Z79810 Long term (current) use of selective estrogen receptor modulators (SERMs): Secondary | ICD-10-CM | POA: Diagnosis not present

## 2017-03-31 DIAGNOSIS — R635 Abnormal weight gain: Secondary | ICD-10-CM

## 2017-03-31 DIAGNOSIS — Z170 Estrogen receptor positive status [ER+]: Secondary | ICD-10-CM

## 2017-03-31 MED ORDER — TAMOXIFEN CITRATE 20 MG PO TABS: 20.0000 mg | ORAL_TABLET | Freq: Every day | ORAL | 3 refills | Status: DC

## 2017-03-31 NOTE — Progress Notes (Signed)
Patient Care Team: [name] Patient as PCP - General (General Practice)  DIAGNOSIS:  Encounter Diagnosis  Name Primary?  . 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%, HER-2 positive ratio 5.61; right biopsy 8:00: IDC grade 3, ER 50%, PR 5%, Ki-67 80%, HER-2 positive ratio 5.38      09/11/2015 Breast MRI    Right breast middle depth: 2.7 cm; right breast posterior outer: 4.4 cm; right breast retroareolar 2.7 cm; probable right axillary and right axillary tail lymphadenopathy      09/18/2015 - 01/01/2016 Neo-Adjuvant Chemotherapy    TCH Perjeta 6      01/09/2016 Breast MRI    Significantly improved enhancement in Rt breast, small enh in central portion, No susp LN      01/22/2016 - 09/09/2016 Chemotherapy    Herceptin maintenance to total one year of therapy      01/24/2016 Genetic Testing    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  Genes analyzed:  APC, ATM, AXIN2, BARD1, BMPR1A, BRCA1, BRCA2, BRIP1, CDH1, CDKN2A, CHEK2, DICER1, EPCAM, GREM1, KIT, MEN1, MLH1, MSH2, MSH6, MUTYH, NBN, NF1, PALB2, PDGFRA, PMS2, POLD1, POLE, PTEN, RAD50, RAD51C, RAD51D, SDHA, SDHB, SDHC, SDHD, SMAD4, SMARCA4, STK11, TP53, TSC1, TSC2, and VHL.      02/11/2016 Surgery    Rt Mastectomy Marlou Starks): No residual cancer 0/16 LN Negative for cancer.      02/18/2016 -  Anti-estrogen oral therapy    Tamoxifen 20 mg daily, planned duration of therapy: 10 years.        CHIEF COMPLIANT: Follow-up on tamoxifen therapy  INTERVAL HISTORY:  Lori Dodson is a 31 year old with above-mentioned history of right breast cancer treated with neoadjuvant chemotherapy followed by Herceptin maintenance for a year. She had mastectomy and is currently on tamoxifen therapy. She is tolerating tamoxifen extremely well. Initially had hot flashes which have resolved. She denies any lumps or nodules in the breasts.  REVIEW OF SYSTEMS:   Constitutional: Denies fevers, chills or abnormal weight loss Eyes: Denies blurriness of vision Ears, nose, mouth, throat, and face: Denies mucositis or sore throat Respiratory: Denies cough, dyspnea or wheezes Cardiovascular: Denies palpitation, chest discomfort Gastrointestinal:  Denies nausea, heartburn or change in bowel habits Skin: Denies abnormal skin rashes Lymphatics: Denies new lymphadenopathy or easy bruising Neurological:Denies numbness, tingling or new weaknesses Behavioral/Psych: Mood is stable, no new changes  Extremities: No lower extremity edema Breast:  denies any pain or lumps or nodules in either breasts All other systems were reviewed with the patient and are negative.  I have reviewed the past medical history, past surgical history, social history and family history with the patient and they are unchanged from previous note.  ALLERGIES:  is allergic to latex.  MEDICATIONS:  Current Outpatient Prescriptions  Medication Sig Dispense Refill  . HYDROcodone-acetaminophen (NORCO/VICODIN) 5-325 MG tablet Take 1-2 tablets by mouth every 4 (four) hours as needed for moderate pain or severe pain. 15 tablet 0  . lidocaine-prilocaine (EMLA) cream Apply to affected area once 30 g 3  .  tamoxifen (NOLVADEX) 20 MG tablet TAKE 1 TABLET BY MOUTH EVERY DAY 90 tablet 0   No current facility-administered medications for this visit.     PHYSICAL EXAMINATION: ECOG PERFORMANCE STATUS: 1 - Symptomatic but completely ambulatory  Vitals:   03/31/17 0948  BP: (!) 101/51  Pulse: 70  Resp: 18  Temp: 98.4 F  (36.9 C)   Filed Weights   03/31/17 0948  Weight: 171 lb 9.6 oz (77.8 kg)    GENERAL:alert, no distress and comfortable SKIN: skin color, texture, turgor are normal, no rashes or significant lesions EYES: normal, Conjunctiva are pink and non-injected, sclera clear OROPHARYNX:no exudate, no erythema and lips, buccal mucosa, and tongue normal  NECK: supple, thyroid normal size, non-tender, without nodularity LYMPH:  no palpable lymphadenopathy in the cervical, axillary or inguinal LUNGS: clear to auscultation and percussion with normal breathing effort HEART: regular rate & rhythm and no murmurs and no lower extremity edema ABDOMEN:abdomen soft, non-tender and normal bowel sounds MUSCULOSKELETAL:no cyanosis of digits and no clubbing  NEURO: alert & oriented x 3 with fluent speech, no focal motor/sensory deficits EXTREMITIES: No lower extremity edema BREAST: Right mastectomy No palpable axillary supraclavicular or infraclavicular adenopathy no breast tenderness or nipple discharge. (exam performed in the presence of a chaperone)  LABORATORY DATA:  I have reviewed the data as listed   Chemistry      Component Value Date/Time   NA 144 09/09/2016 0910   K 3.9 09/09/2016 0910   CL 109 02/05/2016 1032   CO2 25 09/09/2016 0910   BUN 11.7 09/09/2016 0910   CREATININE 0.9 09/09/2016 0910      Component Value Date/Time   CALCIUM 8.6 09/09/2016 0910   ALKPHOS 47 09/09/2016 0910   AST 13 09/09/2016 0910   ALT 10 09/09/2016 0910   BILITOT 0.68 09/09/2016 0910       Lab Results  Component Value Date   WBC 8.6 09/09/2016   HGB 13.0 09/09/2016   HCT 38.3 09/09/2016   MCV 86.7 09/09/2016   PLT 220 09/09/2016   NEUTROABS 4.8 09/09/2016    ASSESSMENT & PLAN:  Breast cancer of upper-outer quadrant of right female breast ([place]) 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 Adjuvant Anti estrogen therapy with Tamoxifen 20 mg daily X 10 years started 02/18/2016 2. Herceptin maintenance every 3 weeks Completed October 2017. --------------------------------------------------------------------------------------------------------- Tamoxifen toxicities: 1. Hot flashes: They have improved over time. 2. Weight gain: she lost significant weight but then she gained it back . She hasn't been exercising. She has gone to [facility] and had a great time.  Surveillance: 1. Mammogram November 2017: Benign 2. breast exam 03/31/2017: No palpable lumps or nodules of concern.  I previously discussed with her extensively about Neratinib and the improvement in disease-free survival by 2-4%. She is not very keen on taking it.  Patient is planning to undergo breast reconstruction at end of the year.  I spent 25 minutes talking to the patient of which more than half was  spent in counseling and coordination of care.  No orders of the defined types were placed in this encounter.  The patient has a good understanding of the overall plan. she agrees with it. she will call with any problems that may develop before the next visit here.   Rulon Eisenmenger, MD 03/31/17

## 2017-03-31 NOTE — Assessment & Plan Note (Signed)
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 Adjuvant Anti estrogen therapy with Tamoxifen 20 mg daily X 10 years started 02/18/2016 2. Herceptin maintenance every 3 weeks until October 2017. --------------------------------------------------------------------------------------------------------- Tamoxifen toxicities: 1. Hot flashes: They have improved over time. 2. Weight gain: she lost significant weight but then she gained it back on her recent cruise trip. She is planning to lose more weight again.  I discussed with her extensively about Neratinib and the improvement in disease-free survival by 2-4%. She is not very keen on taking it. I discussed with her that we can consider this even 6 months later.  

## 2017-06-13 ENCOUNTER — Other Ambulatory Visit: Payer: Self-pay | Admitting: Hematology and Oncology

## 2017-06-13 DIAGNOSIS — C50411 Malignant neoplasm of upper-outer quadrant of right female breast: Secondary | ICD-10-CM

## 2017-09-21 ENCOUNTER — Other Ambulatory Visit: Payer: Self-pay | Admitting: Hematology and Oncology

## 2017-09-21 DIAGNOSIS — C50411 Malignant neoplasm of upper-outer quadrant of right female breast: Secondary | ICD-10-CM

## 2017-09-23 IMAGING — MG MM DIAG BREAST TOMO BILATERAL
12 of 17 series · 12 of 37 positions shown · non-contrast
Comparison: None

CLINICAL DATA: 29-year-old African American female who notes a a
palpable right breast mass. The patient has undergone right breast
ultrasound guided core biopsy of this mass. There is no family
history of breast cancer.

EXAM:
DIGITAL DIAGNOSTIC BILATERAL MAMMOGRAM WITH 3D TOMOSYNTHESIS AND CAD

[L CC (1 of 2)]
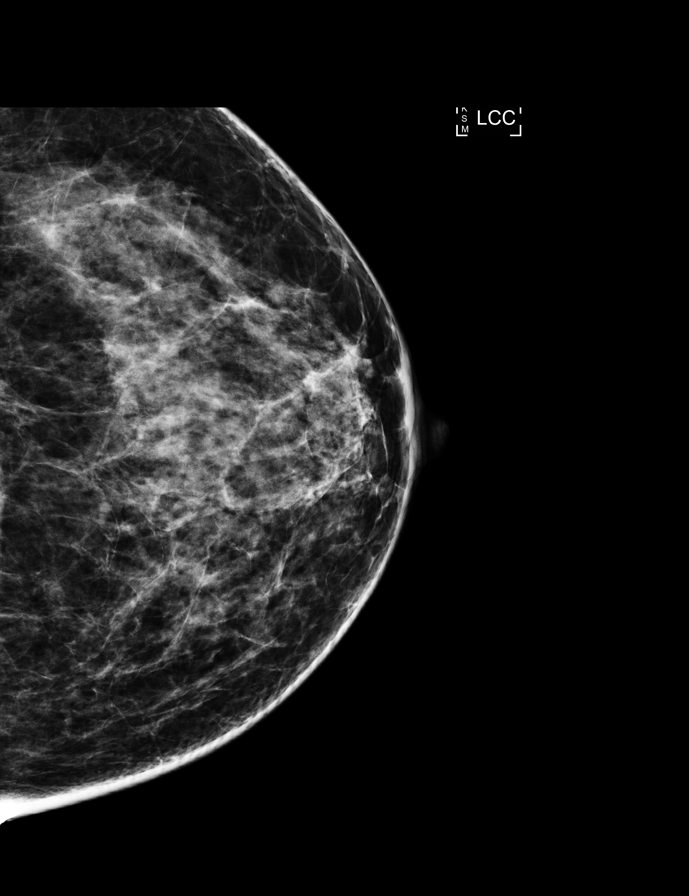

[L MLO (1 of 2)]
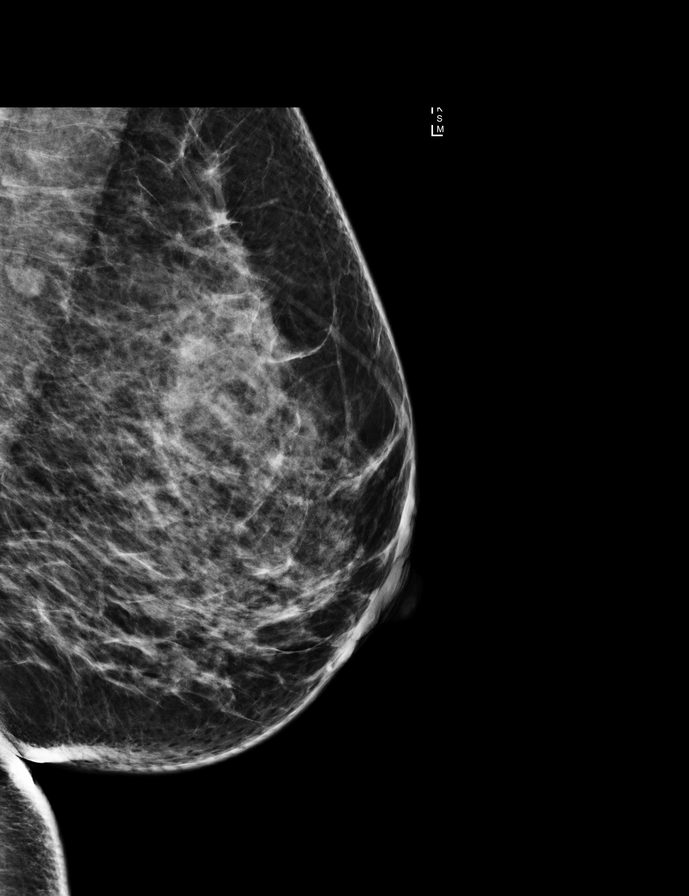

[R MLO]
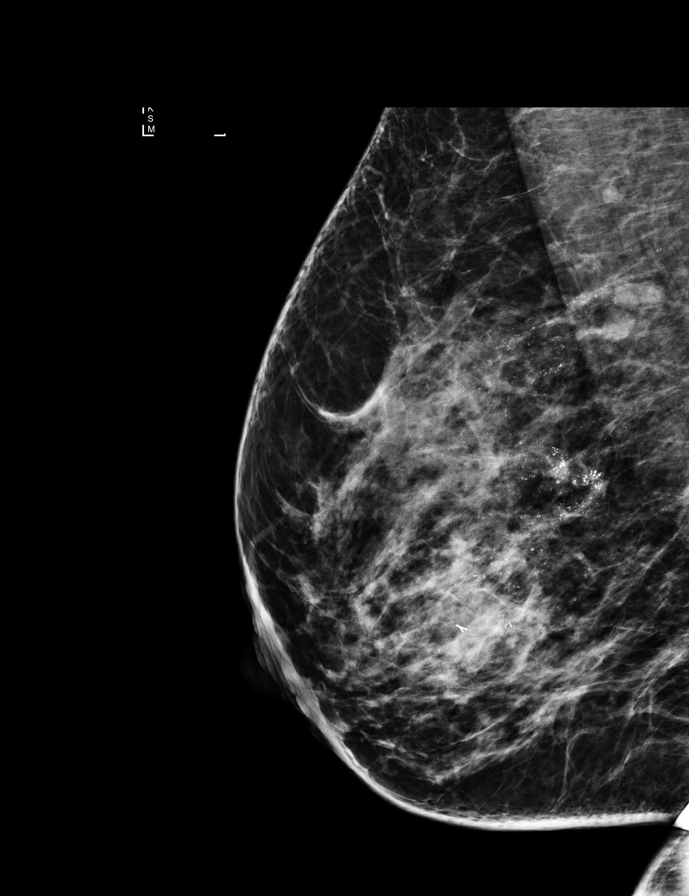

[R ML]
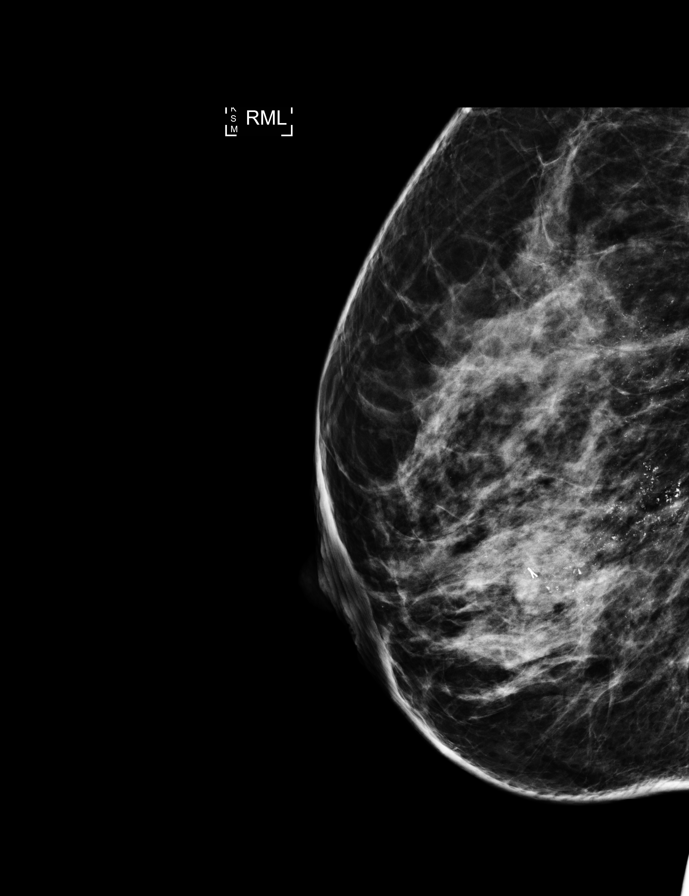

[R CC]
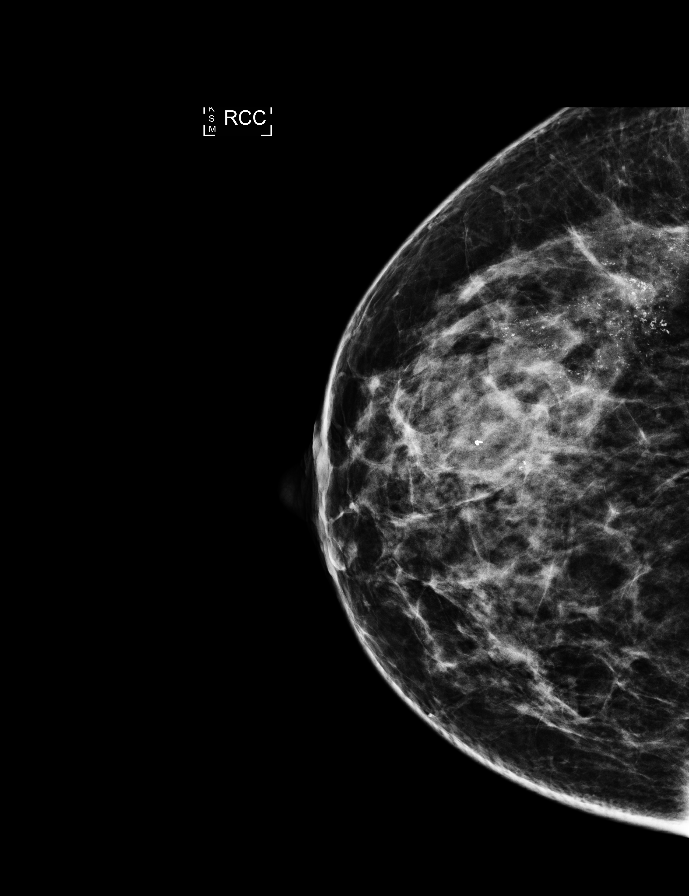

[L MLO (2 of 2)]
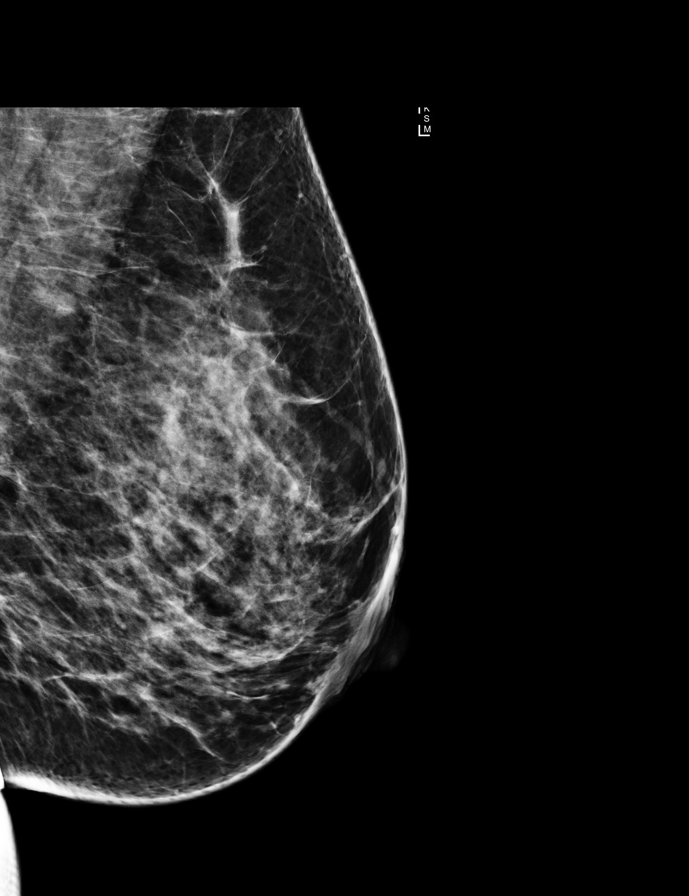

[L CC (2 of 2)]
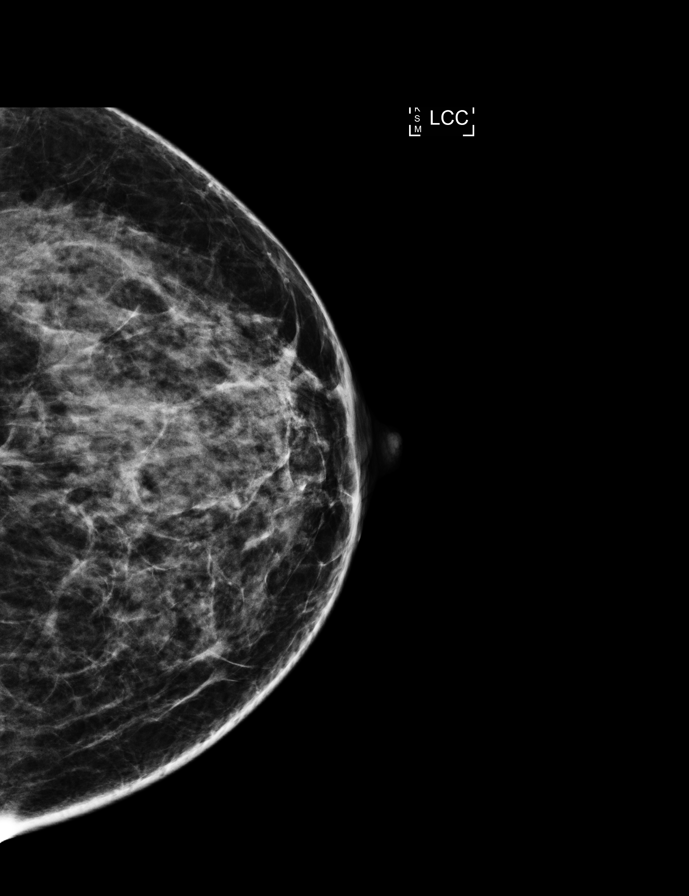

[R CC tomo]
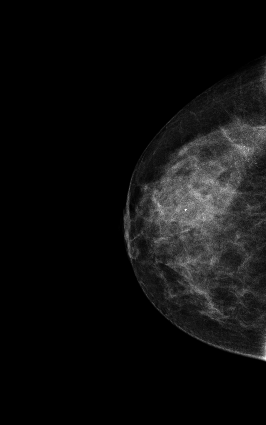

[L MLO tomo]
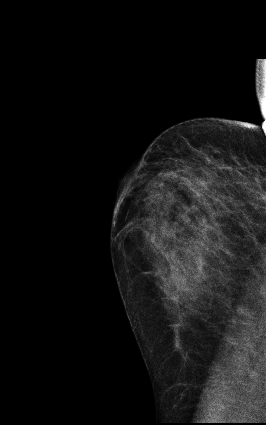

[R ML tomo]
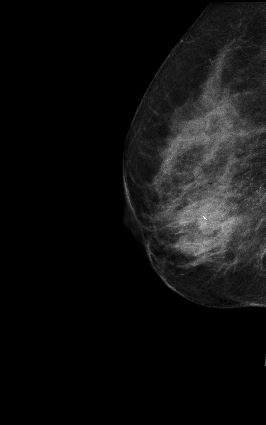

[L CC tomo]
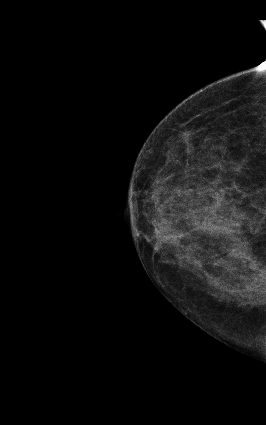

[R MLO tomo]
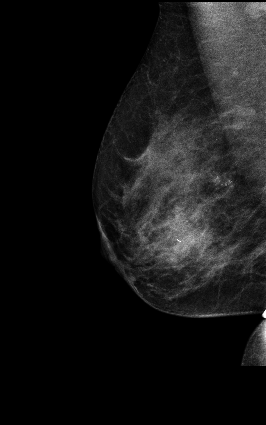

[12 of 37 positions shown; findings below may reference images not displayed]

ACR Breast Density Category c: The breast tissue is heterogeneously
dense, which may obscure small masses.
FINDINGS: There is an area of ill-defined increased density located within the
right breast at the 8 o'clock position with a central ribbon shaped
clip present. This corresponds to the palpable mass recently
biopsied under ultrasound guidance. In addition, there are numerous
pleomorphic microcalcifications extending superiorly and laterally
within the right breast into the low axillary region. These
calcifications span 10.5 cm and are worrisome for DCIS. Stereotactic
biopsy of the posterior lateral extent of disease is recommended. I
have discussed this with the patient and this is currently scheduled
for 08/30/2015. I have also discussed this procedure with the
patient's referring physician office. There are no additional
findings within the right breast and no worrisome findings within
the left breast.

Mammographic images were processed with CAD.
IMPRESSION: 1. Ill-defined mass with central ribbon shaped clip corresponding to
the recently biopsied mass located within the right breast at the 8
o'clock position.
2. Extensive pleomorphic likely malignant calcifications extending
from the mass laterally and superiorly for approximately 10.5 cm.
These are worrisome for DCIS. Stereotactic biopsy of these
calcifications for extent of disease is recommended and is currently
scheduled for 08/30/2015.

RECOMMENDATION:
Right breast stereotactic biopsy as discussed above.

I have discussed the findings and recommendations with the patient.
Results were also provided in writing at the conclusion of the
visit. If applicable, a reminder letter will be sent to the patient
regarding the next appointment.

BI-RADS CATEGORY  5: Highly suggestive of malignancy.

## 2017-10-16 ENCOUNTER — Other Ambulatory Visit: Payer: Self-pay

## 2017-10-16 ENCOUNTER — Encounter (HOSPITAL_BASED_OUTPATIENT_CLINIC_OR_DEPARTMENT_OTHER): Payer: Self-pay | Admitting: *Deleted

## 2017-10-21 NOTE — H&P (Signed)
Lori Dodson is an 31 y.o. female.   Chief Complaint:Absense of right breast HPI:SP right mastectomy for cancer  For right breast reconstruction  Past Medical History:  Diagnosis Date  . Breast cancer ([place]) 09/2015   Right breast  . History of breast cancer 2016  . History of chemotherapy 2016    Past Surgical History:  Procedure Laterality Date  . LEEP    . PORT-A-CATH REMOVAL N/A 10/17/2016   Procedure: REMOVAL PORT-A-CATH;  Surgeon: Autumn Messing III, MD;  Location: [facility];  Service: General;  Laterality: N/A;  . PORTACATH PLACEMENT Left 09/13/2015   Procedure: ATTEMPTED INSERTION PORT-A-CATH;  Surgeon: Autumn Messing III, MD;  Location: [facility];  Service: General;  Laterality: Left;  . RADIOACTIVE SEED GUIDED PARTIAL MASTECTOMY WITH AXILLARY SENTINEL LYMPH NODE BIOPSY Right 02/11/2016   Procedure: RIGHT MASTECTOMY WITH SENTINEL LYMPH NODE MAPPING AND RADIOACTIVE SEED GUIDED RIGHT NODE EXCISION;  Surgeon: Autumn Messing III, MD;  Location: [facility];  Service: General;  Laterality: Right;    Family History  Problem Relation Age of Onset  . Endometrial cancer Mother 30  . Cancer Mother 62       additional cancer from "polyp in groin"  . Stomach cancer Maternal Grandfather        died a year before pt was born; treated with chemo; metastasis   Social History:  reports that  has never smoked. she has never used smokeless tobacco. She reports that she drinks alcohol. She reports that she does not use drugs.  Allergies:  Allergies  Allergen Reactions  . Latex Itching, Swelling and Rash    No medications prior to admission.    No results found for this or any previous visit (from the past 48 hour(s)). No results found.  Review of Systems  Constitutional: Negative.   HENT: Negative.   Eyes: Negative.   Respiratory: Negative.   Cardiovascular: Negative.   Gastrointestinal: Negative.   Genitourinary: Negative.   Musculoskeletal: Negative.   Skin:  Negative.   Neurological: Negative.   Endo/Heme/Allergies: Negative.   Psychiatric/Behavioral: Negative.     Height 5\' 4"  (1.626 m), weight 75.3 kg (166 lb), last menstrual period 10/13/2017. Physical Exam   Assessment/Plan Right mastectomy  For right breast reconstruction with tissue expansion  Savannha Welle L, MD 10/21/2017, 11:20 AM

## 2017-10-25 NOTE — Anesthesia Preprocedure Evaluation (Addendum)
Anesthesia Evaluation  Patient identified by MRN, date of birth, ID band Patient awake    Reviewed: Allergy & Precautions, NPO status , Patient's Chart, lab work & pertinent test results  History of Anesthesia Complications Negative for: history of anesthetic complications  Airway Mallampati: II  TM Distance: >3 FB Neck ROM: Full    Dental  (+) Teeth Intact, Dental Advisory Given   Pulmonary neg pulmonary ROS,    Pulmonary exam normal        Cardiovascular negative cardio ROS Normal cardiovascular exam  10/17 ECHO: EF 55-60%, valves OK   Neuro/Psych negative neurological ROS     GI/Hepatic negative GI ROS, Neg liver ROS,   Endo/Other  negative endocrine ROS  Renal/GU negative Renal ROS     Musculoskeletal   Abdominal   Peds  Hematology negative hematology ROS (+)   Anesthesia Other Findings Breast cancer: mastectomy and Chemo  Reproductive/Obstetrics                            Anesthesia Physical  Anesthesia Plan  ASA: II  Anesthesia Plan: General   Post-op Pain Management:    Induction: Intravenous  PONV Risk Score and Plan: 4 or greater and Ondansetron, Dexamethasone, Scopolamine patch - Pre-op and Diphenhydramine  Airway Management Planned: Oral ETT  Additional Equipment:   Intra-op Plan:   Post-operative Plan: Extubation in OR  Informed Consent: I have reviewed the patients History and Physical, chart, labs and discussed the procedure including the risks, benefits and alternatives for the proposed anesthesia with the patient or authorized representative who has indicated his/her understanding and acceptance.   Dental advisory given  Plan Discussed with: Anesthesiologist, CRNA and Surgeon  Anesthesia Plan Comments:        Anesthesia Quick Evaluation

## 2017-10-26 ENCOUNTER — Encounter (HOSPITAL_BASED_OUTPATIENT_CLINIC_OR_DEPARTMENT_OTHER): Payer: Self-pay

## 2017-10-26 ENCOUNTER — Ambulatory Visit (HOSPITAL_BASED_OUTPATIENT_CLINIC_OR_DEPARTMENT_OTHER): Payer: BLUE CROSS/BLUE SHIELD | Admitting: Anesthesiology

## 2017-10-26 ENCOUNTER — Encounter (HOSPITAL_BASED_OUTPATIENT_CLINIC_OR_DEPARTMENT_OTHER)
Admission: RE | Disposition: A | Discharge status: Home / Self Care | Payer: Self-pay | Source: Ambulatory Visit | Attending: Specialist

## 2017-10-26 ENCOUNTER — Ambulatory Visit (HOSPITAL_BASED_OUTPATIENT_CLINIC_OR_DEPARTMENT_OTHER)
Admission: RE | Admit: 2017-10-26 | Discharge: 2017-10-26 | Disposition: A | Discharge status: Home / Self Care | Payer: BLUE CROSS/BLUE SHIELD | Source: Ambulatory Visit | Attending: Specialist | Admitting: Specialist

## 2017-10-26 ENCOUNTER — Other Ambulatory Visit: Payer: Self-pay

## 2017-10-26 DIAGNOSIS — Z9011 Acquired absence of right breast and nipple: Secondary | ICD-10-CM | POA: Insufficient documentation

## 2017-10-26 DIAGNOSIS — Z9221 Personal history of antineoplastic chemotherapy: Secondary | ICD-10-CM | POA: Diagnosis not present

## 2017-10-26 DIAGNOSIS — Z853 Personal history of malignant neoplasm of breast: Secondary | ICD-10-CM | POA: Diagnosis present

## 2017-10-26 HISTORY — PX: TISSUE EXPANDER PLACEMENT: SHX2530

## 2017-10-26 HISTORY — PX: BREAST RECONSTRUCTION: SHX9

## 2017-10-26 HISTORY — DX: Personal history of antineoplastic chemotherapy: Z92.21

## 2017-10-26 SURGERY — INSERTION, TISSUE EXPANDER
Anesthesia: General | Site: Breast | Laterality: Right

## 2017-10-26 MED ORDER — LIDOCAINE-EPINEPHRINE 0.5 %-1:200000 IJ SOLN
INTRAMUSCULAR | Status: AC
  Filled 2017-10-26: qty 3

## 2017-10-26 MED ORDER — OXYCODONE HCL 5 MG PO TABS
ORAL_TABLET | ORAL | Status: AC
  Filled 2017-10-26: qty 1

## 2017-10-26 MED ORDER — HYDROMORPHONE HCL 1 MG/ML IJ SOLN
INTRAMUSCULAR | Status: AC
  Filled 2017-10-26: qty 0.5

## 2017-10-26 MED ORDER — LIDOCAINE HCL 2 % IJ SOLN
INTRAMUSCULAR | Status: AC
  Filled 2017-10-26: qty 100

## 2017-10-26 MED ORDER — HYDROMORPHONE HCL 1 MG/ML IJ SOLN
0.2500 mg | INTRAMUSCULAR | Status: DC | PRN
  Administered 2017-10-26 (×4): 0.5 mg via INTRAVENOUS

## 2017-10-26 MED ORDER — ROCURONIUM BROMIDE 100 MG/10ML IV SOLN
INTRAVENOUS | Status: DC | PRN
  Administered 2017-10-26: 50 mg via INTRAVENOUS

## 2017-10-26 MED ORDER — PROMETHAZINE HCL 25 MG/ML IJ SOLN: 6.2500 mg | INTRAMUSCULAR | Status: DC | PRN

## 2017-10-26 MED ORDER — MIDAZOLAM HCL 2 MG/2ML IJ SOLN
1.0000 mg | INTRAMUSCULAR | Status: DC | PRN
  Administered 2017-10-26: 2 mg via INTRAVENOUS

## 2017-10-26 MED ORDER — SODIUM BICARBONATE 4 % IV SOLN
INTRAVENOUS | Status: AC
  Filled 2017-10-26: qty 5

## 2017-10-26 MED ORDER — CHLORHEXIDINE GLUCONATE CLOTH 2 % EX PADS: 6.0000 | MEDICATED_PAD | Freq: Once | CUTANEOUS | Status: DC

## 2017-10-26 MED ORDER — FENTANYL CITRATE (PF) 100 MCG/2ML IJ SOLN
50.0000 ug | INTRAMUSCULAR | Status: AC | PRN
  Administered 2017-10-26: 75 ug via INTRAVENOUS
  Administered 2017-10-26: 100 ug via INTRAVENOUS
  Administered 2017-10-26: 25 ug via INTRAVENOUS

## 2017-10-26 MED ORDER — LACTATED RINGERS IV SOLN
INTRAVENOUS | Status: DC
  Administered 2017-10-26: 07:00:00 via INTRAVENOUS

## 2017-10-26 MED ORDER — MIDAZOLAM HCL 2 MG/2ML IJ SOLN
INTRAMUSCULAR | Status: AC
  Filled 2017-10-26: qty 2

## 2017-10-26 MED ORDER — DEXAMETHASONE SODIUM PHOSPHATE 10 MG/ML IJ SOLN
INTRAMUSCULAR | Status: AC
  Filled 2017-10-26: qty 1

## 2017-10-26 MED ORDER — CEFAZOLIN SODIUM-DEXTROSE 2-4 GM/100ML-% IV SOLN
INTRAVENOUS | Status: AC
  Filled 2017-10-26: qty 100

## 2017-10-26 MED ORDER — FENTANYL CITRATE (PF) 100 MCG/2ML IJ SOLN
INTRAMUSCULAR | Status: AC
  Filled 2017-10-26: qty 2

## 2017-10-26 MED ORDER — OXYCODONE HCL 5 MG PO TABS
5.0000 mg | ORAL_TABLET | Freq: Once | ORAL | Status: AC
Start: 2017-10-26 — End: 2017-10-26
  Administered 2017-10-26: 5 mg via ORAL

## 2017-10-26 MED ORDER — SCOPOLAMINE 1 MG/3DAYS TD PT72
1.0000 | MEDICATED_PATCH | Freq: Once | TRANSDERMAL | Status: DC | PRN
  Administered 2017-10-26: 1.5 mg via TRANSDERMAL

## 2017-10-26 MED ORDER — LIDOCAINE 2% (20 MG/ML) 5 ML SYRINGE
INTRAMUSCULAR | Status: AC
  Filled 2017-10-26: qty 5

## 2017-10-26 MED ORDER — LIDOCAINE-EPINEPHRINE 0.5 %-1:200000 IJ SOLN
INTRAMUSCULAR | Status: DC | PRN
  Administered 2017-10-26: 50 mL

## 2017-10-26 MED ORDER — CEFAZOLIN SODIUM 1 G IJ SOLR
INTRAMUSCULAR | Status: DC | PRN
  Administered 2017-10-26: 300 mL

## 2017-10-26 MED ORDER — ONDANSETRON HCL 4 MG/2ML IJ SOLN
INTRAMUSCULAR | Status: AC
  Filled 2017-10-26: qty 2

## 2017-10-26 MED ORDER — LIDOCAINE HCL (CARDIAC) 20 MG/ML IV SOLN
INTRAVENOUS | Status: DC | PRN
  Administered 2017-10-26: 50 mg via INTRAVENOUS

## 2017-10-26 MED ORDER — SUGAMMADEX SODIUM 200 MG/2ML IV SOLN
INTRAVENOUS | Status: DC | PRN
  Administered 2017-10-26: 200 mg via INTRAVENOUS

## 2017-10-26 MED ORDER — SCOPOLAMINE 1 MG/3DAYS TD PT72
MEDICATED_PATCH | TRANSDERMAL | Status: AC
  Filled 2017-10-26: qty 1

## 2017-10-26 MED ORDER — PROPOFOL 500 MG/50ML IV EMUL
INTRAVENOUS | Status: AC
  Filled 2017-10-26: qty 50

## 2017-10-26 MED ORDER — EPINEPHRINE 30 MG/30ML IJ SOLN
INTRAMUSCULAR | Status: AC
  Filled 2017-10-26: qty 1

## 2017-10-26 MED ORDER — ROCURONIUM BROMIDE 10 MG/ML (PF) SYRINGE
PREFILLED_SYRINGE | INTRAVENOUS | Status: AC
  Filled 2017-10-26: qty 5

## 2017-10-26 MED ORDER — DEXAMETHASONE SODIUM PHOSPHATE 4 MG/ML IJ SOLN
INTRAMUSCULAR | Status: DC | PRN
  Administered 2017-10-26: 10 mg via INTRAVENOUS

## 2017-10-26 MED ORDER — CEFAZOLIN SODIUM 1 G IJ SOLR
INTRAMUSCULAR | Status: AC
  Filled 2017-10-26: qty 10

## 2017-10-26 MED ORDER — PROPOFOL 10 MG/ML IV BOLUS
INTRAVENOUS | Status: DC | PRN
  Administered 2017-10-26: 150 mg via INTRAVENOUS

## 2017-10-26 MED ORDER — SODIUM CHLORIDE 0.9 % IV SOLN
INTRAVENOUS | Status: AC | PRN
  Administered 2017-10-26: 150 mL via INTRAMUSCULAR

## 2017-10-26 MED ORDER — CEFAZOLIN SODIUM-DEXTROSE 2-4 GM/100ML-% IV SOLN
2.0000 g | INTRAVENOUS | Status: AC
  Administered 2017-10-26: 2 g via INTRAVENOUS

## 2017-10-26 MED ORDER — ONDANSETRON HCL 4 MG/2ML IJ SOLN
INTRAMUSCULAR | Status: DC | PRN
  Administered 2017-10-26: 4 mg via INTRAVENOUS

## 2017-10-26 SURGICAL SUPPLY — 66 items
BAG DECANTER FOR FLEXI CONT (MISCELLANEOUS) ×3 IMPLANT
BENZOIN TINCTURE PRP APPL 2/3 (GAUZE/BANDAGES/DRESSINGS) ×3 IMPLANT
BLADE KNIFE PERSONA 10 (BLADE) ×3 IMPLANT
BLADE KNIFE PERSONA 15 (BLADE) ×3 IMPLANT
CANISTER SUCT 1200ML W/VALVE (MISCELLANEOUS) ×3 IMPLANT
COVER BACK TABLE 60X90IN (DRAPES) ×3 IMPLANT
COVER MAYO STAND STRL (DRAPES) ×3 IMPLANT
DECANTER SPIKE VIAL GLASS SM (MISCELLANEOUS) ×3 IMPLANT
DRAIN CHANNEL 10M FLAT 3/4 FLT (DRAIN) ×3 IMPLANT
DRAIN PENROSE 1/4X12 LTX STRL (WOUND CARE) IMPLANT
DRAPE LAPAROSCOPIC ABDOMINAL (DRAPES) ×3 IMPLANT
DRSG PAD ABDOMINAL 8X10 ST (GAUZE/BANDAGES/DRESSINGS) ×6 IMPLANT
ELECT BLADE 6.5 .24CM SHAFT (ELECTRODE) ×3 IMPLANT
ELECT REM PT RETURN 9FT ADLT (ELECTROSURGICAL) ×3
ELECTRODE REM PT RTRN 9FT ADLT (ELECTROSURGICAL) ×2 IMPLANT
EVACUATOR SILICONE 100CC (DRAIN) ×3 IMPLANT
EXPANDER TISSUE BREAST 550CC (Breast) ×2 IMPLANT
FILTER 7/8 IN (FILTER) IMPLANT
GAUZE SPONGE 4X4 12PLY STRL (GAUZE/BANDAGES/DRESSINGS) ×3 IMPLANT
GAUZE XEROFORM 5X9 LF (GAUZE/BANDAGES/DRESSINGS) ×3 IMPLANT
GLOVE BIO SURGEON STRL SZ 6.5 (GLOVE) IMPLANT
GLOVE BIOGEL M STRL SZ7.5 (GLOVE) IMPLANT
GLOVE BIOGEL PI IND STRL 6.5 (GLOVE) ×2 IMPLANT
GLOVE BIOGEL PI IND STRL 8 (GLOVE) ×2 IMPLANT
GLOVE BIOGEL PI INDICATOR 6.5 (GLOVE) ×1
GLOVE BIOGEL PI INDICATOR 8 (GLOVE) ×1
GLOVE ECLIPSE 7.0 STRL STRAW (GLOVE) IMPLANT
GLOVE SURG SS PI 7.0 STRL IVOR (GLOVE) ×3 IMPLANT
GLOVE SURG SS PI 7.5 STRL IVOR (GLOVE) ×3 IMPLANT
GOWN STRL REUS W/ TWL LRG LVL3 (GOWN DISPOSABLE) ×2 IMPLANT
GOWN STRL REUS W/ TWL XL LVL3 (GOWN DISPOSABLE) ×2 IMPLANT
GOWN STRL REUS W/TWL LRG LVL3 (GOWN DISPOSABLE) ×1
GOWN STRL REUS W/TWL XL LVL3 (GOWN DISPOSABLE) ×1
IV NS 500ML (IV SOLUTION) ×1
IV NS 500ML BAXH (IV SOLUTION) ×2 IMPLANT
IV SOD CHL 0.9% 1000ML (IV SOLUTION) ×3 IMPLANT
MARKER SKIN DUAL TIP RULER LAB (MISCELLANEOUS) IMPLANT
NEEDLE HYPO 21X1 ECLIPSE (NEEDLE) ×3 IMPLANT
NEEDLE HYPO 25X1 1.5 SAFETY (NEEDLE) IMPLANT
NEEDLE SPNL 18GX3.5 QUINCKE PK (NEEDLE) ×3 IMPLANT
NS IRRIG 1000ML POUR BTL (IV SOLUTION) IMPLANT
PACK BASIN DAY SURGERY FS (CUSTOM PROCEDURE TRAY) ×3 IMPLANT
PEN SKIN MARKING BROAD TIP (MISCELLANEOUS) IMPLANT
PIN SAFETY STERILE (MISCELLANEOUS) ×3 IMPLANT
SLEEVE SCD COMPRESS KNEE MED (MISCELLANEOUS) ×3 IMPLANT
SPONGE LAP 18X18 X RAY DECT (DISPOSABLE) ×6 IMPLANT
STRIP SUTURE WOUND CLOSURE 1/2 (SUTURE) IMPLANT
SUT MNCRL AB 3-0 PS2 18 (SUTURE) ×3 IMPLANT
SUT MON AB 2-0 CT1 36 (SUTURE) ×3 IMPLANT
SUT MON AB 5-0 PS2 18 (SUTURE) IMPLANT
SUT PROLENE 3 0 PS 2 (SUTURE) ×3 IMPLANT
SYR 10ML LL (SYRINGE) ×3 IMPLANT
SYR 20CC LL (SYRINGE) IMPLANT
SYR 50ML LL SCALE MARK (SYRINGE) ×3 IMPLANT
SYR BULB IRRIGATION 50ML (SYRINGE) IMPLANT
SYR CONTROL 10ML LL (SYRINGE) ×3 IMPLANT
TAPE HYPAFIX 6X30 (GAUZE/BANDAGES/DRESSINGS) ×3 IMPLANT
TAPE MEASURE 72IN RETRACT (INSTRUMENTS)
TAPE MEASURE LINEN 72IN RETRCT (INSTRUMENTS) IMPLANT
TISSUE EXPANDER BREAST 550CC (Breast) ×3 IMPLANT
TOWEL OR 17X24 6PK STRL BLUE (TOWEL DISPOSABLE) ×6 IMPLANT
TRAY DSU PREP LF (CUSTOM PROCEDURE TRAY) ×3 IMPLANT
TUBE CONNECTING 20X1/4 (TUBING) ×3 IMPLANT
UNDERPAD 30X30 (UNDERPADS AND DIAPERS) ×6 IMPLANT
VAC PENCILS W/TUBING CLEAR (MISCELLANEOUS) ×3 IMPLANT
YANKAUER SUCT BULB TIP NO VENT (SUCTIONS) ×3 IMPLANT

## 2017-10-26 NOTE — Anesthesia Procedure Notes (Signed)
Procedure Name: Intubation Performed by: Sheila Ocasio W, CRNA Pre-anesthesia Checklist: Patient identified, Emergency Drugs available, Suction available and Patient being monitored Patient Re-evaluated:Patient Re-evaluated prior to induction Oxygen Delivery Method: Circle system utilized Preoxygenation: Pre-oxygenation with 100% oxygen Induction Type: IV induction Ventilation: Mask ventilation without difficulty Laryngoscope Size: Miller and 2 Grade View: Grade I Tube type: Oral Tube size: 7.0 mm Number of attempts: 1 Airway Equipment and Method: Stylet Placement Confirmation: ETT inserted through vocal cords under direct vision,  positive ETCO2 and breath sounds checked- equal and bilateral Tube secured with: Tape Dental Injury: Teeth and Oropharynx as per pre-operative assessment        

## 2017-10-26 NOTE — Discharge Instructions (Signed)
Post Anesthesia Home Care Instructions  Activity: Get plenty of rest for the remainder of the day. A responsible individual must stay with you for 24 hours following the procedure.  For the next 24 hours, DO NOT: -Drive a car -Paediatric nurse -Drink alcoholic beverages -Take any medication unless instructed by your physician -Make any legal decisions or sign important papers.  Meals: Start with liquid foods such as gelatin or soup. Progress to regular foods as tolerated. Avoid greasy, spicy, heavy foods. If nausea and/or vomiting occur, drink only clear liquids until the nausea and/or vomiting subsides. Call your physician if vomiting continues.  Special Instructions/Symptoms: Your throat may feel dry or sore from the anesthesia or the breathing tube placed in your throat during surgery. If this causes discomfort, gargle with warm salt water. The discomfort should disappear within 24 hours.  If you had a scopolamine patch placed behind your ear for the management of post- operative nausea and/or vomiting:  1. The medication in the patch is effective for 72 hours, after which it should be removed.  Wrap patch in a tissue and discard in the trash. Wash hands thoroughly with soap and water. 2. You may remove the patch earlier than 72 hours if you experience unpleasant side effects which may include dry mouth, dizziness or visual disturbances. 3. Avoid touching the patch. Wash your hands with soap and water after contact with the patch.         JP Drain [employer] this sheet to all of your post-operative appointments while you have your drains.  Please measure your drains by CC's or ML's.  Make sure you drain and measure your JP Drains 2 or 3 times per day.  At the end of each day, add up totals for the left side and add up totals for the right side.    ( 9 am )     ( 3 pm )        ( 9 pm )                Date L  R  L  R  L  R  Total L/R                                                                                                                                                                                    About my Jackson-Pratt Bulb Drain  What is a Jackson-Pratt bulb? A Jackson-Pratt is a soft, round device used to collect drainage. It is connected to a long, thin drainage catheter, which is held in place by one or two small stiches near your surgical incision site. When the bulb  is squeezed, it forms a vacuum, forcing the drainage to empty into the bulb.  Emptying the Jackson-Pratt bulb- To empty the bulb: 1. Release the plug on the top of the bulb. 2. Pour the bulb's contents into a measuring container which your nurse will provide. 3. Record the time emptied and amount of drainage. Empty the drain(s) as often as your     doctor or nurse recommends.  Date                  Time                    Amount (Drain 1)                 Amount (Drain 2)  _____________________________________________________________________  _____________________________________________________________________  _____________________________________________________________________  _____________________________________________________________________  _____________________________________________________________________  _____________________________________________________________________  _____________________________________________________________________  _____________________________________________________________________  Squeezing the Jackson-Pratt Bulb- To squeeze the bulb: 1. Make sure the plug at the top of the bulb is open. 2. Squeeze the bulb tightly in your fist. You will hear air squeezing from the bulb. 3. Replace the plug while the bulb is squeezed. 4. Use a safety pin to attach the bulb to your clothing. This will keep the catheter from     pulling at the bulb insertion site.  When to call your doctor- Call your doctor if:  Drain  site becomes red, swollen or hot.  You have a fever greater than 101 degrees F.  There is oozing at the drain site.  Drain falls out (apply a guaze bandage over the drain hole and secure it with tape).  Drainage increases daily not related to activity patterns. (You will usually have more drainage when you are active than when you are resting.)  Drainage has a bad odor.    Activity (include date of return to work if known) As tolerated: NO showers NO driving No heavy activities  Diet:regular No restrictions:  Wound Care: Keep dressing clean & dry  Do not change dressings  Special Instructions: Do not raise arms up Continue to empty, recharge, & record drainage from J-P drains &/or Hemovacs 2-3 times a day, as needed. Call Doctor if any unusual problems occur such as pain, excessive Bleeding, unrelieved Nausea/vomiting, Fever &/or chills When lying down, keep head elevated on 2-3 pillows or back-rest Follow-up appointment: Please call the office.  The patient received discharge instruction from:___________________________________________   Patient signature ________________________________________ / Date___________    Signature of individual providing instructions/ Date________________     About my Jackson-Pratt Bulb Drain  What is a Jackson-Pratt bulb? A Jackson-Pratt is a soft, round device used to collect drainage. It is connected to a long, thin drainage catheter, which is held in place by one or two small stiches near your surgical incision site. When the bulb is squeezed, it forms a vacuum, forcing the drainage to empty into the bulb.  Emptying the Jackson-Pratt bulb- To empty the bulb: 1. Release the plug on the top of the bulb. 2. Pour the bulb's contents into a measuring container which your nurse will provide. 3. Record the time emptied and amount of drainage. Empty the drain(s) as often as your     doctor or nurse recommends.  Date                  Time                     Amount (Drain 1)  Amount (Drain 2)  _____________________________________________________________________  _____________________________________________________________________  _____________________________________________________________________  _____________________________________________________________________  _____________________________________________________________________  _____________________________________________________________________  _____________________________________________________________________  _____________________________________________________________________  Squeezing the Jackson-Pratt Bulb- To squeeze the bulb: 1. Make sure the plug at the top of the bulb is open. 2. Squeeze the bulb tightly in your fist. You will hear air squeezing from the bulb. 3. Replace the plug while the bulb is squeezed. 4. Use a safety pin to attach the bulb to your clothing. This will keep the catheter from     pulling at the bulb insertion site.  When to call your doctor- Call your doctor if:  Drain site becomes red, swollen or hot.  You have a fever greater than 101 degrees F.  There is oozing at the drain site.  Drain falls out (apply a guaze bandage over the drain hole and secure it with tape).  Drainage increases daily not related to activity patterns. (You will usually have more drainage when you are active than when you are resting.)  Drainage has a bad odor.        Call your surgeon if you experience:   1.  Fever over 101.0. 2.  Inability to urinate. 3.  Nausea and/or vomiting. 4.  Extreme swelling or bruising at the surgical site. 5.  Continued bleeding from the incision. 6.  Increased pain, redness or drainage from the incision. 7.  Problems related to your pain medication. 8.  Any problems and/or concerns  Post Anesthesia Home Care Instructions  Activity: Get plenty of rest for the remainder of the  day. A responsible individual must stay with you for 24 hours following the procedure.  For the next 24 hours, DO NOT: -Drive a car -Paediatric nurse -Drink alcoholic beverages -Take any medication unless instructed by your physician -Make any legal decisions or sign important papers.  Meals: Start with liquid foods such as gelatin or soup. Progress to regular foods as tolerated. Avoid greasy, spicy, heavy foods. If nausea and/or vomiting occur, drink only clear liquids until the nausea and/or vomiting subsides. Call your physician if vomiting continues.  Special Instructions/Symptoms: Your throat may feel dry or sore from the anesthesia or the breathing tube placed in your throat during surgery. If this causes discomfort, gargle with warm salt water. The discomfort should disappear within 24 hours.  If you had a scopolamine patch placed behind your ear for the management of post- operative nausea and/or vomiting:  1. The medication in the patch is effective for 72 hours, after which it should be removed.  Wrap patch in a tissue and discard in the trash. Wash hands thoroughly with soap and water. 2. You may remove the patch earlier than 72 hours if you experience unpleasant side effects which may include dry mouth, dizziness or visual disturbances. 3. Avoid touching the patch. Wash your hands with soap and water after contact with the patch.     Oxycodone one tablet given in Recovery Room at 10;45 AM

## 2017-10-26 NOTE — Brief Op Note (Signed)
10/26/2017  8:46 AM  PATIENT:  Lori Dodson  31 y.o. female  PRE-OPERATIVE DIAGNOSIS:  RIGHT BREAST CANCER  POST-OPERATIVE DIAGNOSIS:  RIGHT BREAST CANCER  PROCEDURE:  Procedure(s): RIGHT BREAST RECONSTRUCTION WITH PLACEMENT OF TISSUE EXPANDER (Right) BREAST RECONSTRUCTION (Right)  SURGEON:  Surgeon(s) and Role:    Cristine Polio, MD - Primary  PHYSICIAN ASSISTANT:   ASSISTANTS: none   ANESTHESIA:   general  EBL:  10 mL   BLOOD ADMINISTERED:none  DRAINS: (bbb) Jackson-Pratt drain(s) with closed bulb suction in the right lateral edge   LOCAL MEDICATIONS USED:  LIDOCAINE   SPECIMEN:  No Specimen  DISPOSITION OF SPECIMEN:  N/A  COUNTS:  YES  TOURNIQUET:  * No tourniquets in log *  DICTATION: .Other Dictation: Dictation Number [id number]  PLAN OF CARE: Discharge to home after PACU  PATIENT DISPOSITION:  PACU - hemodynamically stable.   Delay start of Pharmacological VTE agent (>24hrs) due to surgical blood loss or risk of bleeding: yes

## 2017-10-26 NOTE — Transfer of Care (Signed)
Immediate Anesthesia Transfer of Care Note  Patient: Lori Dodson  Procedure(s) Performed: RIGHT BREAST RECONSTRUCTION WITH PLACEMENT OF TISSUE EXPANDER (Right Breast) BREAST RECONSTRUCTION (Right Breast)  Patient Location: PACU  Anesthesia Type:General  Level of Consciousness: awake and sedated  Airway & Oxygen Therapy: Patient Spontanous Breathing and Patient connected to face mask oxygen  Post-op Assessment: Report given to RN and Post -op Vital signs reviewed and stable  Post vital signs: Reviewed and stable  Last Vitals:  Vitals:   10/26/17 0647 10/26/17 0853  BP: 114/72 121/83  Pulse: 78 100  Resp: 18 18  Temp: 37.1 C   SpO2: 100% 100%    Last Pain:  Vitals:   10/26/17 0647  TempSrc: Oral         Complications: No apparent anesthesia complications

## 2017-10-26 NOTE — OR Nursing (Signed)
Upon arrival to the OR suite, the patient became very agitated, sobbing and breathing rapidly.  Ricki Rodriguez, CRNA and J. Bladimir Auman, RN spoke with patient, reassuring patient about what to expect from the perioperative routine.  Patient was questioned about her willingness to continue.  Patient indicated that she was OK to proceed.  States that this "always happens" to her in these circumstances.  Patient transferred self to the OR bed.  Induction of anesthesia proceeded without further incident.

## 2017-10-26 NOTE — Anesthesia Postprocedure Evaluation (Signed)
Anesthesia Post Note  Patient: Lori Dodson  Procedure(s) Performed: RIGHT BREAST RECONSTRUCTION WITH PLACEMENT OF TISSUE EXPANDER (Right Breast) BREAST RECONSTRUCTION (Right Breast)     Patient location during evaluation: PACU Anesthesia Type: General Level of consciousness: sedated Pain management: pain level controlled Vital Signs Assessment: post-procedure vital signs reviewed and stable Respiratory status: spontaneous breathing and respiratory function stable Cardiovascular status: stable Postop Assessment: no apparent nausea or vomiting Anesthetic complications: no    Last Vitals:  Vitals:   10/26/17 1021 10/26/17 1022  BP:    Pulse: 78 76  Resp: (!) 25 17  Temp:    SpO2: 100% 100%    Last Pain:  Vitals:   10/26/17 1015  TempSrc:   PainSc: Asleep                 Sharrie Self DANIEL

## 2017-10-26 NOTE — Anesthesia Procedure Notes (Signed)
Procedure Name: Intubation Performed by: Terrance Mass, CRNA Pre-anesthesia Checklist: Patient identified, Emergency Drugs available, Suction available and Patient being monitored Patient Re-evaluated:Patient Re-evaluated prior to induction Oxygen Delivery Method: Circle system utilized Preoxygenation: Pre-oxygenation with 100% oxygen Induction Type: IV induction Ventilation: Mask ventilation without difficulty Laryngoscope Size: Miller and 2 Grade View: Grade I Tube type: Oral Number of attempts: 1 Airway Equipment and Method: Stylet and Oral airway Placement Confirmation: ETT inserted through vocal cords under direct vision,  positive ETCO2 and breath sounds checked- equal and bilateral Secured at: 22 cm Tube secured with: Tape Dental Injury: Teeth and Oropharynx as per pre-operative assessment

## 2017-10-27 ENCOUNTER — Encounter (HOSPITAL_BASED_OUTPATIENT_CLINIC_OR_DEPARTMENT_OTHER): Payer: Self-pay | Admitting: Specialist

## 2017-10-27 NOTE — Op Note (Signed)
[name], Lori Dodson                 ACCOUNT NO.:  [id number]  MEDICAL RECORD NO.:  [id number]  LOCATION:                                 FACILITY:  PHYSICIAN:  [place] Towanda Malkin, M.D.   DATE OF BIRTH:  DATE OF PROCEDURE:  10/26/2017 DATE OF DISCHARGE:                              OPERATIVE REPORT   A 31 year old young lady who has a history of right breast cancer, status post modified right mastectomy several years ago.  He has done very well from that with no recurrence.  Had medication, chemo, etc. She has now been prepared for right breast reconstruction using tissue expansion.  We had a detailed discussion in regard to tissue expansion as well as other indications of breast reconstruction.  I explained all the pros and cons of each procedure.  The patient has chosen a tissue expansion, understanding potential risks and possible complications with that even.  All procedures in detail were explained to the patient preoperatively.  PROCEDURES DONE:  Right breast reconstruction with tissue expansion.  ANESTHESIA:  General.  The patient underwent general anesthesia after she had drawings done sitting up preoperatively.  General anesthesia was administered.  Prep was done to the chest and breast areas in a routine fashion, using Hibiclens soap and solution, walled off with sterile towels and drapes so as make a sterile field.  The right mastectomy transverse incision was injected with 1/3% Xylocaine with epinephrine 150 mL.  We entered that using a 15 blade down to skin and subcutaneous tissue.  Then, we used the Bovie to go from the subcutaneous tissue to the underlying pectoralis major muscle.  The muscle divided in the direction of its own fibers and I was able to dissect the upper part of the dissection with finger dissection.  Medially, we used the Bovie unit coagulation as well as the inferior portion of the dissection of the muscle.  After proper hemostasis using the  Bovie unit coagulation, irrigation was done with saline and after proper hemostasis, the tissue expander was placed into the pocket and injected with 200 mL of saline with excellent symmetry as compared to the left side.  The port was taken laterally into the subcutaneous plane and stayed there with 3-0 Monocryl subcutaneously. The wounds were cleansed.  Subcutaneous closure and muscle closure were done with 2-0 Monocryl x2 layers, subcutaneous closure with 3-0 Monocryl and then running subcuticular stitch of 3-0 Monocryl.  Steri-Strips and sterile dressing were applied to all the areas.  She withstood the procedures very well, was taken to Recovery in excellent condition.  The wounds were drained with #10 fully fluted Blake drains, which were placed in the depths of wound and brought out through the lateral-most portion of the incision and secured with 3-0 Prolene.  Sterile dressings were placed.  The patient tolerated the procedures very well and was taken to Recovery in excellent condition.     Odella Aquas. Towanda Malkin, M.D.   ______________________________ Odella Aquas. Towanda Malkin, M.D.    Elie Confer  D:  10/26/2017  T:  10/26/2017  Job:  [id number]

## 2017-12-03 ENCOUNTER — Other Ambulatory Visit: Payer: Self-pay | Admitting: Hematology and Oncology

## 2017-12-03 DIAGNOSIS — Z1231 Encounter for screening mammogram for malignant neoplasm of breast: Secondary | ICD-10-CM

## 2017-12-21 ENCOUNTER — Encounter (HOSPITAL_BASED_OUTPATIENT_CLINIC_OR_DEPARTMENT_OTHER): Payer: Self-pay | Admitting: *Deleted

## 2017-12-22 ENCOUNTER — Other Ambulatory Visit: Payer: Self-pay

## 2017-12-22 ENCOUNTER — Ambulatory Visit
Admission: RE | Admit: 2017-12-22 | Discharge: 2017-12-22 | Disposition: A | Discharge status: Home / Self Care | Payer: BLUE CROSS/BLUE SHIELD | Source: Ambulatory Visit | Attending: Hematology and Oncology | Admitting: Hematology and Oncology

## 2017-12-22 DIAGNOSIS — Z1231 Encounter for screening mammogram for malignant neoplasm of breast: Secondary | ICD-10-CM

## 2017-12-24 ENCOUNTER — Other Ambulatory Visit: Payer: Self-pay | Admitting: Hematology and Oncology

## 2017-12-24 DIAGNOSIS — R928 Other abnormal and inconclusive findings on diagnostic imaging of breast: Secondary | ICD-10-CM

## 2017-12-25 NOTE — H&P (Signed)
Lori Dodson is an 32 y.o. female.   Chief Complaint: right breast cancer HPI: SP  Reconstruction with tissue expander  Now for removal and permanent gel implant  Past Medical History:  Diagnosis Date  . History of chemotherapy 2016  . History of right breast cancer 09/2015    Past Surgical History:  Procedure Laterality Date  . BREAST RECONSTRUCTION Right 10/26/2017   Procedure: BREAST RECONSTRUCTION;  Surgeon: Cristine Polio, MD;  Location: [facility];  Service: Plastics;  Laterality: Right;  . LEEP    . MASTECTOMY Right    malignant  . PORT-A-CATH REMOVAL N/A 10/17/2016   Procedure: REMOVAL PORT-A-CATH;  Surgeon: Autumn Messing III, MD;  Location: [facility];  Service: General;  Laterality: N/A;  . PORTA CATH INSERTION  09/17/2015   interventional radiology  . PORTACATH PLACEMENT Left 09/13/2015   Procedure: ATTEMPTED INSERTION PORT-A-CATH;  Surgeon: Autumn Messing III, MD;  Location: [facility];  Service: General;  Laterality: Left;  . RADIOACTIVE SEED GUIDED PARTIAL MASTECTOMY WITH AXILLARY SENTINEL LYMPH NODE BIOPSY Right 02/11/2016   Procedure: RIGHT MASTECTOMY WITH SENTINEL LYMPH NODE MAPPING AND RADIOACTIVE SEED GUIDED RIGHT NODE EXCISION;  Surgeon: Autumn Messing III, MD;  Location: [facility];  Service: General;  Laterality: Right;  . TISSUE EXPANDER PLACEMENT Right 10/26/2017   Procedure: RIGHT BREAST RECONSTRUCTION WITH PLACEMENT OF TISSUE EXPANDER;  Surgeon: Cristine Polio, MD;  Location: [facility];  Service: Plastics;  Laterality: Right;    Family History  Problem Relation Age of Onset  . Endometrial cancer Mother 82  . Cancer Mother 20       additional cancer from "polyp in groin"  . Stomach cancer Maternal Grandfather        died a year before pt was born; treated with chemo; metastasis   Social History:  reports that  has never smoked. she has never used smokeless tobacco. She reports that she drinks alcohol. She  reports that she does not use drugs.  Allergies:  Allergies  Allergen Reactions  . Chlorhexidine Gluconate Itching  . Latex Itching, Swelling and Rash    No medications prior to admission.    No results found for this or any previous visit (from the past 48 hour(s)). No results found.  Review of Systems  Constitutional: Negative.   HENT: Negative.   Eyes: Negative.   Respiratory: Negative.   Cardiovascular: Negative.   Gastrointestinal: Negative.   Genitourinary: Negative.   Skin: Negative.   Neurological: Negative.   Endo/Heme/Allergies: Negative.   Psychiatric/Behavioral: Negative.     Height 5\' 3"  (1.6 m), weight 76.2 kg (168 lb), last menstrual period 12/14/2017. Physical Exam   Assessment/Plan Right breast cancer for removal of tissue expander and replacement with permanent gel implant  Lori Dodson L, MD 12/25/2017, 3:45 PM

## 2017-12-28 ENCOUNTER — Other Ambulatory Visit: Payer: Self-pay

## 2017-12-28 ENCOUNTER — Ambulatory Visit (HOSPITAL_BASED_OUTPATIENT_CLINIC_OR_DEPARTMENT_OTHER): Payer: BLUE CROSS/BLUE SHIELD | Admitting: Anesthesiology

## 2017-12-28 ENCOUNTER — Ambulatory Visit (HOSPITAL_BASED_OUTPATIENT_CLINIC_OR_DEPARTMENT_OTHER)
Admission: RE | Admit: 2017-12-28 | Discharge: 2017-12-28 | Disposition: A | Discharge status: Home / Self Care | Payer: BLUE CROSS/BLUE SHIELD | Source: Ambulatory Visit | Attending: Specialist | Admitting: Specialist

## 2017-12-28 ENCOUNTER — Encounter (HOSPITAL_BASED_OUTPATIENT_CLINIC_OR_DEPARTMENT_OTHER)
Admission: RE | Disposition: A | Discharge status: Home / Self Care | Payer: Self-pay | Source: Ambulatory Visit | Attending: Specialist

## 2017-12-28 ENCOUNTER — Encounter (HOSPITAL_BASED_OUTPATIENT_CLINIC_OR_DEPARTMENT_OTHER): Payer: Self-pay | Admitting: Anesthesiology

## 2017-12-28 DIAGNOSIS — Z853 Personal history of malignant neoplasm of breast: Secondary | ICD-10-CM | POA: Diagnosis present

## 2017-12-28 DIAGNOSIS — Z9221 Personal history of antineoplastic chemotherapy: Secondary | ICD-10-CM | POA: Diagnosis not present

## 2017-12-28 DIAGNOSIS — Z9889 Other specified postprocedural states: Secondary | ICD-10-CM | POA: Insufficient documentation

## 2017-12-28 HISTORY — DX: Personal history of malignant neoplasm of breast: Z85.3

## 2017-12-28 HISTORY — PX: REMOVAL OF BILATERAL TISSUE EXPANDERS WITH PLACEMENT OF BILATERAL BREAST IMPLANTS: SHX6431

## 2017-12-28 SURGERY — REMOVAL, TISSUE EXPANDER, BREAST, BILATERAL, WITH BILATERAL IMPLANT IMPLANT INSERTION
Anesthesia: General | Site: Breast | Laterality: Right

## 2017-12-28 MED ORDER — DEXAMETHASONE SODIUM PHOSPHATE 4 MG/ML IJ SOLN
INTRAMUSCULAR | Status: DC | PRN
  Administered 2017-12-28: 10 mg via INTRAVENOUS

## 2017-12-28 MED ORDER — FENTANYL CITRATE (PF) 100 MCG/2ML IJ SOLN
INTRAMUSCULAR | Status: AC
  Filled 2017-12-28: qty 2

## 2017-12-28 MED ORDER — OXYCODONE HCL 5 MG/5ML PO SOLN: 5.0000 mg | Freq: Once | ORAL | Status: DC | PRN

## 2017-12-28 MED ORDER — LACTATED RINGERS IV SOLN
INTRAVENOUS | Status: DC
  Administered 2017-12-28: 10:00:00 via INTRAVENOUS

## 2017-12-28 MED ORDER — FENTANYL CITRATE (PF) 100 MCG/2ML IJ SOLN
50.0000 ug | INTRAMUSCULAR | Status: DC | PRN
  Administered 2017-12-28: 100 ug via INTRAVENOUS

## 2017-12-28 MED ORDER — MIDAZOLAM HCL 2 MG/2ML IJ SOLN
INTRAMUSCULAR | Status: AC
  Filled 2017-12-28: qty 2

## 2017-12-28 MED ORDER — ACETAMINOPHEN 325 MG PO TABS: 325.0000 mg | ORAL_TABLET | ORAL | Status: DC | PRN

## 2017-12-28 MED ORDER — LIDOCAINE-EPINEPHRINE 0.5 %-1:200000 IJ SOLN
INTRAMUSCULAR | Status: DC | PRN
  Administered 2017-12-28: 10 mL

## 2017-12-28 MED ORDER — KETOROLAC TROMETHAMINE 30 MG/ML IJ SOLN: 30.0000 mg | Freq: Once | INTRAMUSCULAR | Status: DC | PRN

## 2017-12-28 MED ORDER — PROPOFOL 10 MG/ML IV BOLUS
INTRAVENOUS | Status: DC | PRN
  Administered 2017-12-28: 200 mg via INTRAVENOUS

## 2017-12-28 MED ORDER — SUGAMMADEX SODIUM 200 MG/2ML IV SOLN
INTRAVENOUS | Status: DC | PRN
  Administered 2017-12-28: 200 mg via INTRAVENOUS
  Administered 2017-12-28: 300 mg via INTRAVENOUS

## 2017-12-28 MED ORDER — MIDAZOLAM HCL 2 MG/2ML IJ SOLN
INTRAMUSCULAR | Status: AC
Start: 2017-12-28 — End: ?
  Filled 2017-12-28: qty 2

## 2017-12-28 MED ORDER — LIDOCAINE HCL (CARDIAC) 20 MG/ML IV SOLN
INTRAVENOUS | Status: DC | PRN
  Administered 2017-12-28: 60 mg via INTRAVENOUS

## 2017-12-28 MED ORDER — SUGAMMADEX SODIUM 200 MG/2ML IV SOLN
INTRAVENOUS | Status: AC
  Filled 2017-12-28: qty 2

## 2017-12-28 MED ORDER — CHLORHEXIDINE GLUCONATE CLOTH 2 % EX PADS: 6.0000 | MEDICATED_PAD | Freq: Once | CUTANEOUS | Status: DC

## 2017-12-28 MED ORDER — ROCURONIUM BROMIDE 100 MG/10ML IV SOLN
INTRAVENOUS | Status: DC | PRN
  Administered 2017-12-28: 50 mg via INTRAVENOUS

## 2017-12-28 MED ORDER — DEXAMETHASONE SODIUM PHOSPHATE 10 MG/ML IJ SOLN
INTRAMUSCULAR | Status: AC
  Filled 2017-12-28: qty 1

## 2017-12-28 MED ORDER — ROCURONIUM BROMIDE 10 MG/ML (PF) SYRINGE
PREFILLED_SYRINGE | INTRAVENOUS | Status: AC
  Filled 2017-12-28: qty 5

## 2017-12-28 MED ORDER — ACETAMINOPHEN 160 MG/5ML PO SOLN: 325.0000 mg | ORAL | Status: DC | PRN

## 2017-12-28 MED ORDER — MIDAZOLAM HCL 2 MG/2ML IJ SOLN
1.0000 mg | INTRAMUSCULAR | Status: DC | PRN
  Administered 2017-12-28: 1 mg via INTRAVENOUS
  Administered 2017-12-28: 2 mg via INTRAVENOUS

## 2017-12-28 MED ORDER — ONDANSETRON HCL 4 MG/2ML IJ SOLN
INTRAMUSCULAR | Status: DC | PRN
  Administered 2017-12-28: 4 mg via INTRAVENOUS

## 2017-12-28 MED ORDER — LIDOCAINE HCL 2 % IJ SOLN
INTRAMUSCULAR | Status: AC
  Filled 2017-12-28: qty 100

## 2017-12-28 MED ORDER — OXYCODONE HCL 5 MG PO TABS: 5.0000 mg | ORAL_TABLET | Freq: Once | ORAL | Status: DC | PRN

## 2017-12-28 MED ORDER — ONDANSETRON HCL 4 MG/2ML IJ SOLN
INTRAMUSCULAR | Status: AC
  Filled 2017-12-28: qty 2

## 2017-12-28 MED ORDER — DEXMEDETOMIDINE HCL IN NACL 200 MCG/50ML IV SOLN
INTRAVENOUS | Status: AC
  Filled 2017-12-28: qty 50

## 2017-12-28 MED ORDER — LIDOCAINE 2% (20 MG/ML) 5 ML SYRINGE
INTRAMUSCULAR | Status: AC
Start: 2017-12-28 — End: ?
  Filled 2017-12-28: qty 5

## 2017-12-28 MED ORDER — SCOPOLAMINE 1 MG/3DAYS TD PT72: 1.0000 | MEDICATED_PATCH | Freq: Once | TRANSDERMAL | Status: DC | PRN

## 2017-12-28 MED ORDER — FENTANYL CITRATE (PF) 100 MCG/2ML IJ SOLN
INTRAMUSCULAR | Status: AC
Start: 2017-12-28 — End: ?
  Filled 2017-12-28: qty 2

## 2017-12-28 MED ORDER — FENTANYL CITRATE (PF) 100 MCG/2ML IJ SOLN
25.0000 ug | INTRAMUSCULAR | Status: DC | PRN
  Administered 2017-12-28: 50 ug via INTRAVENOUS

## 2017-12-28 MED ORDER — CEFAZOLIN SODIUM-DEXTROSE 2-4 GM/100ML-% IV SOLN
INTRAVENOUS | Status: AC
  Filled 2017-12-28: qty 100

## 2017-12-28 MED ORDER — CEFAZOLIN SODIUM-DEXTROSE 2-4 GM/100ML-% IV SOLN
2.0000 g | INTRAVENOUS | Status: AC
  Administered 2017-12-28: 2 g via INTRAVENOUS

## 2017-12-28 MED ORDER — SUGAMMADEX SODIUM 500 MG/5ML IV SOLN
INTRAVENOUS | Status: AC
  Filled 2017-12-28: qty 5

## 2017-12-28 MED ORDER — MEPERIDINE HCL 25 MG/ML IJ SOLN: 6.2500 mg | INTRAMUSCULAR | Status: DC | PRN

## 2017-12-28 MED ORDER — SODIUM BICARBONATE 4 % IV SOLN
INTRAVENOUS | Status: AC
  Filled 2017-12-28: qty 5

## 2017-12-28 MED ORDER — ONDANSETRON HCL 4 MG/2ML IJ SOLN: 4.0000 mg | Freq: Once | INTRAMUSCULAR | Status: DC | PRN

## 2017-12-28 SURGICAL SUPPLY — 72 items
BAG DECANTER FOR FLEXI CONT (MISCELLANEOUS) ×3 IMPLANT
BENZOIN TINCTURE PRP APPL 2/3 (GAUZE/BANDAGES/DRESSINGS) ×3 IMPLANT
BINDER BREAST LRG (GAUZE/BANDAGES/DRESSINGS) IMPLANT
BINDER BREAST MEDIUM (GAUZE/BANDAGES/DRESSINGS) IMPLANT
BINDER BREAST XLRG (GAUZE/BANDAGES/DRESSINGS) IMPLANT
BINDER BREAST XXLRG (GAUZE/BANDAGES/DRESSINGS) IMPLANT
BLADE KNIFE PERSONA 10 (BLADE) ×3 IMPLANT
BLADE KNIFE PERSONA 15 (BLADE) ×3 IMPLANT
CANISTER SUCT 1200ML W/VALVE (MISCELLANEOUS) ×3 IMPLANT
COVER BACK TABLE 60X90IN (DRAPES) ×3 IMPLANT
COVER MAYO STAND STRL (DRAPES) ×3 IMPLANT
DECANTER SPIKE VIAL GLASS SM (MISCELLANEOUS) ×3 IMPLANT
DRAIN CHANNEL 10M FLAT 3/4 FLT (DRAIN) IMPLANT
DRAIN PENROSE 1/4X12 LTX STRL (WOUND CARE) IMPLANT
DRAPE LAPAROSCOPIC ABDOMINAL (DRAPES) ×3 IMPLANT
DRSG PAD ABDOMINAL 8X10 ST (GAUZE/BANDAGES/DRESSINGS) ×6 IMPLANT
ELECT BLADE 6.5 .24CM SHAFT (ELECTRODE) ×3 IMPLANT
ELECT REM PT RETURN 9FT ADLT (ELECTROSURGICAL) ×3
ELECTRODE REM PT RTRN 9FT ADLT (ELECTROSURGICAL) ×1 IMPLANT
EVACUATOR SILICONE 100CC (DRAIN) IMPLANT
FILTER 7/8 IN (FILTER) IMPLANT
GAUZE SPONGE 4X4 12PLY STRL (GAUZE/BANDAGES/DRESSINGS) ×3 IMPLANT
GAUZE XEROFORM 5X9 LF (GAUZE/BANDAGES/DRESSINGS) IMPLANT
GLOVE BIO SURGEON STRL SZ 6.5 (GLOVE) IMPLANT
GLOVE BIO SURGEONS STRL SZ 6.5 (GLOVE)
GLOVE BIOGEL M STRL SZ7.5 (GLOVE) IMPLANT
GLOVE BIOGEL PI IND STRL 6.5 (GLOVE) ×1 IMPLANT
GLOVE BIOGEL PI IND STRL 8 (GLOVE) IMPLANT
GLOVE BIOGEL PI INDICATOR 6.5 (GLOVE) ×2
GLOVE BIOGEL PI INDICATOR 8 (GLOVE)
GLOVE ECLIPSE 7.0 STRL STRAW (GLOVE) IMPLANT
GLOVE SURG SS PI 6.5 STRL IVOR (GLOVE) ×3 IMPLANT
GLOVE SURG SS PI 7.0 STRL IVOR (GLOVE) ×3 IMPLANT
GOWN STRL REUS W/ TWL LRG LVL3 (GOWN DISPOSABLE) IMPLANT
GOWN STRL REUS W/ TWL XL LVL3 (GOWN DISPOSABLE) ×2 IMPLANT
GOWN STRL REUS W/TWL LRG LVL3 (GOWN DISPOSABLE)
GOWN STRL REUS W/TWL XL LVL3 (GOWN DISPOSABLE) ×4
IMPL BREAST RND GEL 255CC (Breast) ×1 IMPLANT
IMPLANT BREAST RND GEL 255CC (Breast) ×3 IMPLANT
IV NS 500ML (IV SOLUTION) ×8
IV NS 500ML BAXH (IV SOLUTION) ×4 IMPLANT
KIT FILL SYSTEM UNIVERSAL (SET/KITS/TRAYS/PACK) IMPLANT
MARKER SKIN DUAL TIP RULER LAB (MISCELLANEOUS) ×3 IMPLANT
NEEDLE HYPO 25X1 1.5 SAFETY (NEEDLE) ×3 IMPLANT
NEEDLE SPNL 18GX3.5 QUINCKE PK (NEEDLE) IMPLANT
NS IRRIG 1000ML POUR BTL (IV SOLUTION) ×3 IMPLANT
PACK BASIN DAY SURGERY FS (CUSTOM PROCEDURE TRAY) ×3 IMPLANT
PEN SKIN MARKING BROAD TIP (MISCELLANEOUS) ×3 IMPLANT
PIN SAFETY STERILE (MISCELLANEOUS) IMPLANT
SHEETING SILICONE GEL EPI DERM (MISCELLANEOUS) IMPLANT
SLEEVE SCD COMPRESS KNEE MED (MISCELLANEOUS) ×3 IMPLANT
SPONGE LAP 18X18 X RAY DECT (DISPOSABLE) ×6 IMPLANT
STRIP SUTURE WOUND CLOSURE 1/2 (SUTURE) IMPLANT
SUT MNCRL AB 3-0 PS2 18 (SUTURE) ×3 IMPLANT
SUT MON AB 2-0 CT1 36 (SUTURE) ×3 IMPLANT
SUT MON AB 5-0 PS2 18 (SUTURE) IMPLANT
SUT PROLENE 3 0 PS 2 (SUTURE) IMPLANT
SYR 20CC LL (SYRINGE) IMPLANT
SYR 50ML LL SCALE MARK (SYRINGE) ×6 IMPLANT
SYR BULB IRRIGATION 50ML (SYRINGE) ×3 IMPLANT
SYR CONTROL 10ML LL (SYRINGE) ×3 IMPLANT
TAPE HYPAFIX 6 X30' (GAUZE/BANDAGES/DRESSINGS) ×1
TAPE HYPAFIX 6X30 (GAUZE/BANDAGES/DRESSINGS) ×2 IMPLANT
TAPE MEASURE 72IN RETRACT (INSTRUMENTS)
TAPE MEASURE LINEN 72IN RETRCT (INSTRUMENTS) IMPLANT
TOWEL OR 17X24 6PK STRL BLUE (TOWEL DISPOSABLE) ×9 IMPLANT
TRAY DSU PREP LF (CUSTOM PROCEDURE TRAY) ×3 IMPLANT
TUBE CONNECTING 20'X1/4 (TUBING) ×1
TUBE CONNECTING 20X1/4 (TUBING) ×2 IMPLANT
UNDERPAD 30X30 (UNDERPADS AND DIAPERS) ×6 IMPLANT
VAC PENCILS W/TUBING CLEAR (MISCELLANEOUS) ×3 IMPLANT
YANKAUER SUCT BULB TIP NO VENT (SUCTIONS) ×3 IMPLANT

## 2017-12-28 NOTE — Anesthesia Postprocedure Evaluation (Signed)
Anesthesia Post Note  Patient: Lis Savitt  Procedure(s) Performed: REMOVAL OF RIGHT TISSUE EXPANDER WITH PLACEMENT WITH GEL BREAST IMPLANT (Right Breast)     Patient location during evaluation: PACU Anesthesia Type: General Level of consciousness: awake and alert Pain management: pain level controlled Vital Signs Assessment: post-procedure vital signs reviewed and stable Respiratory status: spontaneous breathing, nonlabored ventilation, respiratory function stable and patient connected to nasal cannula oxygen Cardiovascular status: blood pressure returned to baseline and stable Postop Assessment: no apparent nausea or vomiting Anesthetic complications: no    Last Vitals:  Vitals:   12/28/17 1315 12/28/17 1330  BP: (!) 153/76 (!) 98/58  Pulse: (!) 101 69  Resp: (!) 27 12  Temp:    SpO2: 100% 100%    Last Pain:  Vitals:   12/28/17 1302  TempSrc:   PainSc: 0-No pain                 Destiny Trickey

## 2017-12-28 NOTE — Anesthesia Preprocedure Evaluation (Signed)
Anesthesia Evaluation  Patient identified by MRN, date of birth, ID band Patient awake    Reviewed: Allergy & Precautions, NPO status , Patient's Chart, lab work & pertinent test results  History of Anesthesia Complications Negative for: history of anesthetic complications  Airway Mallampati: II  TM Distance: >3 FB Neck ROM: Full    Dental no notable dental hx. (+) Teeth Intact, Dental Advisory Given   Pulmonary neg pulmonary ROS,    Pulmonary exam normal breath sounds clear to auscultation       Cardiovascular negative cardio ROS Normal cardiovascular exam Rhythm:Regular Rate:Normal  10/17 ECHO: EF 55-60%, valves OK   Neuro/Psych negative neurological ROS  negative psych ROS   GI/Hepatic negative GI ROS, Neg liver ROS,   Endo/Other  negative endocrine ROS  Renal/GU negative Renal ROS  negative genitourinary   Musculoskeletal negative musculoskeletal ROS (+)   Abdominal   Peds negative pediatric ROS (+)  Hematology negative hematology ROS (+)   Anesthesia Other Findings Breast cancer: mastectomy and Chemo  Reproductive/Obstetrics negative OB ROS                             Anesthesia Physical  Anesthesia Plan  ASA: II  Anesthesia Plan: General   Post-op Pain Management:    Induction: Intravenous  PONV Risk Score and Plan: 4 or greater and Ondansetron, Dexamethasone and Treatment may vary due to age or medical condition  Airway Management Planned: Oral ETT and LMA  Additional Equipment:   Intra-op Plan:   Post-operative Plan: Extubation in OR  Informed Consent: I have reviewed the patients History and Physical, chart, labs and discussed the procedure including the risks, benefits and alternatives for the proposed anesthesia with the patient or authorized representative who has indicated his/her understanding and acceptance.   Dental advisory given  Plan Discussed with:  Anesthesiologist, CRNA and Surgeon  Anesthesia Plan Comments:         Anesthesia Quick Evaluation

## 2017-12-28 NOTE — Discharge Instructions (Signed)
Activity (include date of return to work if known) As tolerated: NO showers NO driving No heavy activities  Diet:regular No restrictions:  Wound Care: Keep dressing clean & dry  Do not change dressings  Special Instructions: Do not raise arms up   Call Doctor if any unusual problems occur such as pain, excessive Bleeding, unrelieved Nausea/vomiting, Fever &/or chills When lying down, keep head elevated on 2-3 pillows or back-rest  Follow-up appointment: Please call the office.  The patient received discharge instruction from:___________________________________________   Patient signature ________________________________________ / Date___________    Signature of individual providing instructions/ Date________________                  Post Anesthesia Home Care Instructions  Activity: Get plenty of rest for the remainder of the day. A responsible individual must stay with you for 24 hours following the procedure.  For the next 24 hours, DO NOT: -Drive a car -Paediatric nurse -Drink alcoholic beverages -Take any medication unless instructed by your physician -Make any legal decisions or sign important papers.  Meals: Start with liquid foods such as gelatin or soup. Progress to regular foods as tolerated. Avoid greasy, spicy, heavy foods. If nausea and/or vomiting occur, drink only clear liquids until the nausea and/or vomiting subsides. Call your physician if vomiting continues.  Special Instructions/Symptoms: Your throat may feel dry or sore from the anesthesia or the breathing tube placed in your throat during surgery. If this causes discomfort, gargle with warm salt water. The discomfort should disappear within 24 hours.  If you had a scopolamine patch placed behind your ear for the management of post- operative nausea and/or vomiting:  1. The medication in the patch is effective for 72 hours, after which it should be removed.  Wrap patch in a tissue and discard  in the trash. Wash hands thoroughly with soap and water. 2. You may remove the patch earlier than 72 hours if you experience unpleasant side effects which may include dry mouth, dizziness or visual disturbances. 3. Avoid touching the patch. Wash your hands with soap and water after contact with the patch.

## 2017-12-28 NOTE — Brief Op Note (Signed)
12/28/2017  12:53 PM  PATIENT:  Lori Dodson  32 y.o. female  PRE-OPERATIVE DIAGNOSIS:  breast cancer  POST-OPERATIVE DIAGNOSIS:  breast cancer  PROCEDURE:  Procedure(s): REMOVAL OF RIGHT TISSUE EXPANDER WITH PLACEMENT WITH GEL BREAST IMPLANT (Right)  SURGEON:  Surgeon(s) and Role:    * Cristine Polio, MD - Primary  PHYSICIAN ASSISTANT:   ASSISTANTS: none   ANESTHESIA:   general  EBL:  5 mL   BLOOD ADMINISTERED:none  DRAINS: none   LOCAL MEDICATIONS USED:  LIDOCAINE   SPECIMEN:  Excision  DISPOSITION OF SPECIMEN:  PATHOLOGY  COUNTS:  YES  TOURNIQUET:  * No tourniquets in log *  DICTATION: .Other Dictation: Dictation Number [id number]  PLAN OF CARE: Discharge to home after PACU  PATIENT DISPOSITION:  PACU - hemodynamically stable.   Delay start of Pharmacological VTE agent (>24hrs) due to surgical blood loss or risk of bleeding: yes

## 2017-12-28 NOTE — Anesthesia Procedure Notes (Signed)
Procedure Name: Intubation Date/Time: 12/28/2017 12:20 PM Performed by: Maryella Shivers, CRNA Pre-anesthesia Checklist: Patient identified, Emergency Drugs available, Suction available and Patient being monitored Patient Re-evaluated:Patient Re-evaluated prior to induction Oxygen Delivery Method: Circle system utilized Preoxygenation: Pre-oxygenation with 100% oxygen Induction Type: IV induction Ventilation: Mask ventilation without difficulty Laryngoscope Size: Mac and 3 Grade View: Grade I Tube type: Oral Tube size: 7.0 mm Number of attempts: 1 Airway Equipment and Method: Stylet and Oral airway Placement Confirmation: ETT inserted through vocal cords under direct vision,  positive ETCO2 and breath sounds checked- equal and bilateral Secured at: 20 cm Tube secured with: Tape Dental Injury: Teeth and Oropharynx as per pre-operative assessment

## 2017-12-28 NOTE — Transfer of Care (Signed)
Immediate Anesthesia Transfer of Care Note  Patient: Lori Dodson  Procedure(s) Performed: REMOVAL OF RIGHT TISSUE EXPANDER WITH PLACEMENT WITH GEL BREAST IMPLANT (Right )  Patient Location: PACU  Anesthesia Type:General  Level of Consciousness: awake and sedated  Airway & Oxygen Therapy: Patient Spontanous Breathing and Patient connected to face mask oxygen  Post-op Assessment: Report given to RN and Post -op Vital signs reviewed and stable  Post vital signs: Reviewed and stable  Last Vitals:  Vitals:   12/28/17 1130 12/28/17 1302  BP: 101/72 118/71  Pulse: 78 99  Resp: (!) 22   Temp:  36.7 C  SpO2: 100% 100%    Last Pain:  Vitals:   12/28/17 1000  TempSrc: Oral         Complications: No apparent anesthesia complications

## 2017-12-29 ENCOUNTER — Encounter (HOSPITAL_BASED_OUTPATIENT_CLINIC_OR_DEPARTMENT_OTHER): Payer: Self-pay | Admitting: Specialist

## 2017-12-29 NOTE — Op Note (Signed)
NAMEBLAIR, MESINA                      ACCOUNT NO.:  MEDICAL RECORD NO.:  [id number]  LOCATION:                                 FACILITY:  PHYSICIAN:  Berneta Sages L. Towanda Malkin, M.D.   DATE OF BIRTH:  DATE OF PROCEDURE:  12/28/2017 DATE OF DISCHARGE:                              OPERATIVE REPORT   This is a 32 year old lady who has right breast cancer, status post right reconstruction of the breast with tissue expansion.  Has done well since that period of time and has stretched significantly.  The patient has plans for removal of tissue expander and port, replacement of that with a Mentor gel implant, serial #[id number] for the right side. Preoperatively, the patient underwent drawings for the reconstruction.  She was then taken to the operating room, placed on the operating room table in the supine position, was given adequate general anesthesia, intubated orally.  Prep was done to the chest and breast areas in a routine fashion using Betadine soap and solution, walled off with sterile towels and drapes so as to make a sterile field.  The previous transverse incision which was made with some scar tissue laterally included.  This was taken down through the skin, subcutaneous tissue, underlying pectoralis major muscle.  The muscle was opened in the direction of its fibers, exposing the underlying tissue expander tubing, which was traced up to the tissue expander port which was removed with the implant intact.  Interval breast exam and chest shows no masses. Irrigation was done with saline and the space then had some capsule formations superiorly and laterally and they were freed up with the Bovie unit for capsulotomies almost circumferentially.  After this, the space was filled with a Mentor gel implant; a 255-mL was fit very nicely within the space with excellent symmetry as compared to the left side __________ was done and then the muscle repaired with 2-0 Monocryl subcutaneous  tissue, 2-0 Monocryl subcutaneously x2 levels and then a running subcuticular stitch of 3-0 Monocryl.  Steri-Strips and soft dressing were applied to all areas and pressure dressing.  Estimated blood loss less than 50 mL.  Complications none.     Odella Aquas. Towanda Malkin, M.D.     Elie Confer  D:  12/28/2017  T:  12/28/2017  Job:  [id number]

## 2017-12-31 ENCOUNTER — Ambulatory Visit
Admission: RE | Admit: 2017-12-31 | Discharge: 2017-12-31 | Disposition: A | Discharge status: Home / Self Care | Payer: BLUE CROSS/BLUE SHIELD | Source: Ambulatory Visit | Attending: Hematology and Oncology | Admitting: Hematology and Oncology

## 2017-12-31 DIAGNOSIS — R928 Other abnormal and inconclusive findings on diagnostic imaging of breast: Secondary | ICD-10-CM

## 2018-03-31 ENCOUNTER — Inpatient Hospital Stay: Payer: BLUE CROSS/BLUE SHIELD | Admitting: Hematology and Oncology

## 2018-03-31 ENCOUNTER — Telehealth: Payer: Self-pay | Admitting: Hematology and Oncology

## 2018-03-31 NOTE — Assessment & Plan Note (Deleted)
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 Adjuvant Anti estrogen therapy with Tamoxifen 20 mg daily X 10 years started 02/18/2016 2. Herceptin maintenance every 3 weeks Completed October 2017. --------------------------------------------------------------------------------------------------------- Tamoxifen toxicities: 1. Hot flashes: They have improved over time. 2. Weight gain: she lost significant weight but then she gained it back . She hasn't been exercising. She has gone to [facility] and had a great time.  Surveillance: 1. Mammogram 12/22/2017: 7 mm cluster of microcysts at 4 o'clock position felt to be benign 2. breast exam not done today because of recent reconstruction of the breast.  Return to clinic in 1 year for surveillance and follow-up

## 2018-03-31 NOTE — Progress Notes (Deleted)
Patient Care Team: Patient, [name] as PCP - General (General Practice)  DIAGNOSIS:  Encounter Diagnosis  Name Primary?  . 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%, HER-2 positive ratio 5.61; right biopsy 8:00: IDC grade 3, ER 50%, PR 5%, Ki-67 80%, HER-2 positive ratio 5.38      09/11/2015 Breast MRI    Right breast middle depth: 2.7 cm; right breast posterior outer: 4.4 cm; right breast retroareolar 2.7 cm; probable right axillary and right axillary tail lymphadenopathy      09/18/2015 - 01/01/2016 Neo-Adjuvant Chemotherapy    TCH Perjeta 6      01/09/2016 Breast MRI    Significantly improved enhancement in Rt breast, small enh in central portion, No susp LN      01/22/2016 - 09/09/2016 Chemotherapy    Herceptin maintenance to total one year of therapy      01/24/2016 Genetic Testing    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  Genes analyzed:  APC, ATM, AXIN2, BARD1, BMPR1A, BRCA1, BRCA2, BRIP1, CDH1, CDKN2A, CHEK2, DICER1, EPCAM, GREM1, KIT, MEN1, MLH1, MSH2, MSH6, MUTYH, NBN, NF1, PALB2, PDGFRA, PMS2, POLD1, POLE, PTEN, RAD50, RAD51C, RAD51D, SDHA, SDHB, SDHC, SDHD, SMAD4, SMARCA4, STK11, TP53, TSC1, TSC2, and VHL.      02/11/2016 Surgery    Rt Mastectomy Marlou Starks): No residual cancer 0/16 LN Negative for cancer.      02/18/2016 -  Anti-estrogen oral therapy    Tamoxifen 20 mg daily, planned duration of therapy: 10 years.        CHIEF COMPLIANT: Follow-up on  tamoxifen therapy  INTERVAL HISTORY: Lori Dodson is a 32 year old with above-mentioned history of right breast cancer treated with neoadjuvant chemotherapy followed by Herceptin maintenance therapy.  She underwent right mastectomy and reconstruction.  She is currently on tamoxifen appears to be tolerating it fairly well.  She does not have any hot flashes or myalgias.  REVIEW OF SYSTEMS:   Constitutional: Denies fevers, chills or abnormal weight loss Eyes: Denies blurriness of vision Ears, nose, mouth, throat, and face: Denies mucositis or sore throat Respiratory: Denies cough, dyspnea or wheezes Cardiovascular: Denies palpitation, chest discomfort Gastrointestinal:  Denies nausea, heartburn or change in bowel habits Skin: Denies abnormal skin rashes Lymphatics: Denies new lymphadenopathy or easy bruising Neurological:Denies numbness, tingling or new weaknesses Behavioral/Psych: Mood is stable, no new changes  Extremities: No lower extremity edema Breast: Recent completion of right breast reconstruction All other systems were reviewed with the patient and are negative.  I have reviewed the past medical history, past surgical history, social history and family history with the patient and they are unchanged from previous note.  ALLERGIES:  is allergic to chlorhexidine gluconate and latex.  MEDICATIONS:  Current Outpatient Medications  Medication Sig Dispense Refill  . tamoxifen (NOLVADEX) 20 MG tablet Take 1 tablet (20 mg total) by mouth daily. 90 tablet 3   No current facility-administered medications for this visit.     PHYSICAL EXAMINATION: ECOG PERFORMANCE STATUS: 1 - Symptomatic but completely ambulatory  There were no vitals filed for this visit. There were no vitals filed for this visit.  GENERAL:alert, no distress and comfortable SKIN: skin color, texture, turgor are normal, no rashes or significant lesions EYES: normal, Conjunctiva are pink and non-injected, sclera  clear OROPHARYNX:no exudate, no erythema and lips, buccal mucosa, and tongue normal  NECK: supple, thyroid normal size, non-tender, without nodularity LYMPH:  no palpable lymphadenopathy in the cervical, axillary or inguinal LUNGS: clear to auscultation and percussion with normal breathing effort HEART: regular rate & rhythm and no murmurs and no lower extremity edema ABDOMEN:abdomen soft, non-tender and normal bowel sounds MUSCULOSKELETAL:no cyanosis of digits and no clubbing  NEURO: alert & oriented x 3 with fluent speech, no focal motor/sensory deficits EXTREMITIES: No lower extremity edema  LABORATORY DATA:  I have reviewed the data as listed CMP Latest Ref Rng & Units 09/09/2016 08/20/2016 07/29/2016  Glucose 70 - 140 mg/dl 109 126 114  BUN 7.0 - 26.0 mg/dL 11.7 15.9 12.4  Creatinine 0.6 - 1.1 mg/dL 0.9 1.1 0.9  Sodium 136 - 145 mEq/L 144 143 142  Potassium 3.5 - 5.1 mEq/L 3.9 4.0 4.0  Chloride 101 - 111 mmol/L - - -  CO2 22 - 29 mEq/L 25 24 23   Calcium 8.4 - 10.4 mg/dL 8.6 9.0 9.0  Total Protein 6.4 - 8.3 g/dL 6.2(L) 6.7 6.6  Total Bilirubin 0.20 - 1.20 mg/dL 0.68 0.75 0.49  Alkaline Phos 40 - 150 U/L 47 50 52  AST 5 - 34 U/L 13 13 14   ALT 0 - 55 U/L 10 13 12     Lab Results  Component Value Date   WBC 8.6 09/09/2016   HGB 13.0 09/09/2016   HCT 38.3 09/09/2016   MCV 86.7 09/09/2016   PLT 220 09/09/2016   NEUTROABS 4.8 09/09/2016    ASSESSMENT & PLAN:  Malignant neoplasm of upper-outer quadrant of right breast in female, estrogen receptor positive ([place]) 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 Adjuvant Anti estrogen therapy with Tamoxifen 20 mg daily X 10 years started 02/18/2016 2. Herceptin maintenance every 3 weeks Completed October 2017. --------------------------------------------------------------------------------------------------------- Tamoxifen toxicities: 1. Hot flashes: They have improved over time. 2. Weight gain: she lost significant weight but then she gained it back . She hasn't been exercising. She has gone to [facility] and had a great time.  Surveillance: 1. Mammogram 12/22/2017: 7 mm cluster of microcysts at 4 o'clock position felt to be benign 2. breast exam not done today because of recent reconstruction of the breast.  Return to clinic in 1 year for surveillance and follow-up      No orders of the defined types were placed in this encounter.  The patient has a good understanding of the overall plan. she agrees with it. she will call with any problems that may develop before the next visit here.   Harriette Ohara, MD 03/31/18

## 2018-03-31 NOTE — Telephone Encounter (Signed)
Patient called to cancel will call back to reschedule

## 2018-04-05 ENCOUNTER — Other Ambulatory Visit: Payer: Self-pay | Admitting: Hematology and Oncology

## 2018-04-05 DIAGNOSIS — Z170 Estrogen receptor positive status [ER+]: Principal | ICD-10-CM

## 2018-04-05 DIAGNOSIS — C50411 Malignant neoplasm of upper-outer quadrant of right female breast: Secondary | ICD-10-CM

## 2018-07-04 NOTE — Assessment & Plan Note (Signed)
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 Adjuvant Anti estrogen therapy with Tamoxifen 20 mg daily X 10 years started 02/18/2016 2. Herceptin maintenance every 3 weeks Completed October 2017. --------------------------------------------------------------------------------------------------------- Tamoxifen toxicities: 1. Hot flashes: They have improved over time. 2. Weight gain: she lost significant weight but then she gained it back . She hasn't been exercising. She has gone to [facility] and had a great time.  Surveillance: 1. Mammogram 12/31/17 Benign; 7 mm microcycts at 4 o clock position 2. breast exam 07/05/18: No palpable lumps or nodules of concern.  RTC in 1 year for follow up

## 2018-07-05 ENCOUNTER — Inpatient Hospital Stay: Payer: BLUE CROSS/BLUE SHIELD | Attending: Hematology and Oncology | Admitting: Hematology and Oncology

## 2018-07-05 ENCOUNTER — Telehealth: Payer: Self-pay | Admitting: Hematology and Oncology

## 2018-07-05 DIAGNOSIS — R635 Abnormal weight gain: Secondary | ICD-10-CM | POA: Diagnosis not present

## 2018-07-05 DIAGNOSIS — Z170 Estrogen receptor positive status [ER+]: Secondary | ICD-10-CM | POA: Diagnosis not present

## 2018-07-05 DIAGNOSIS — Z9221 Personal history of antineoplastic chemotherapy: Secondary | ICD-10-CM | POA: Diagnosis not present

## 2018-07-05 DIAGNOSIS — C50411 Malignant neoplasm of upper-outer quadrant of right female breast: Secondary | ICD-10-CM | POA: Diagnosis not present

## 2018-07-05 DIAGNOSIS — N951 Menopausal and female climacteric states: Secondary | ICD-10-CM | POA: Diagnosis not present

## 2018-07-05 DIAGNOSIS — Z79810 Long term (current) use of selective estrogen receptor modulators (SERMs): Secondary | ICD-10-CM | POA: Insufficient documentation

## 2018-07-05 DIAGNOSIS — Z9011 Acquired absence of right breast and nipple: Secondary | ICD-10-CM | POA: Diagnosis not present

## 2018-07-05 MED ORDER — TAMOXIFEN CITRATE 20 MG PO TABS: 20.0000 mg | ORAL_TABLET | Freq: Every day | ORAL | 3 refills | Status: DC

## 2018-07-05 NOTE — Progress Notes (Signed)
Patient Care Team: Patient, [name] as PCP - General (General Practice)  DIAGNOSIS:  Encounter Diagnosis  Name Primary?  . 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%, HER-2 positive ratio 5.61; right biopsy 8:00: IDC grade 3, ER 50%, PR 5%, Ki-67 80%, HER-2 positive ratio 5.38      09/11/2015 Breast MRI    Right breast middle depth: 2.7 cm; right breast posterior outer: 4.4 cm; right breast retroareolar 2.7 cm; probable right axillary and right axillary tail lymphadenopathy      09/18/2015 - 01/01/2016 Neo-Adjuvant Chemotherapy    TCH Perjeta 6      01/09/2016 Breast MRI    Significantly improved enhancement in Rt breast, small enh in central portion, No susp LN      01/22/2016 - 09/09/2016 Chemotherapy    Herceptin maintenance to total one year of therapy      01/24/2016 Genetic Testing    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  Genes analyzed:  APC, ATM, AXIN2, BARD1, BMPR1A, BRCA1, BRCA2, BRIP1, CDH1, CDKN2A, CHEK2, DICER1, EPCAM, GREM1, KIT, MEN1, MLH1, MSH2, MSH6, MUTYH, NBN, NF1, PALB2, PDGFRA, PMS2, POLD1, POLE, PTEN, RAD50, RAD51C, RAD51D, SDHA, SDHB, SDHC, SDHD, SMAD4, SMARCA4, STK11, TP53, TSC1, TSC2, and VHL.      02/11/2016 Surgery    Rt Mastectomy Marlou Starks): No residual cancer 0/16 LN Negative for cancer.      02/18/2016 -  Anti-estrogen oral therapy    Tamoxifen 20 mg daily, planned duration of therapy: 10 years.        CHIEF COMPLIANT: Follow-up on  tamoxifen therapy  INTERVAL HISTORY: Lori Dodson is a 32 year old with above-mentioned history of right breast cancer treated with neoadjuvant chemotherapy followed by mastectomy and is currently on tamoxifen.  She appears to be tolerating tamoxifen extremely well.  She does not have any hot flashes or myalgias.  Denies any lumps or nodules in the breast.  REVIEW OF SYSTEMS:   Constitutional: Denies fevers, chills or abnormal weight loss Eyes: Denies blurriness of vision Ears, nose, mouth, throat, and face: Denies mucositis or sore throat Respiratory: Denies cough, dyspnea or wheezes Cardiovascular: Denies palpitation, chest discomfort Gastrointestinal:  Denies nausea, heartburn or change in bowel habits Skin: Denies abnormal skin rashes Lymphatics: Denies new lymphadenopathy or easy bruising Neurological:Denies numbness, tingling or new weaknesses Behavioral/Psych: Mood is stable, no new changes  Extremities: No lower extremity edema Breast: Right mastectomy with reconstruction All other systems were reviewed with the patient and are negative.  I have reviewed the past medical history, past surgical history, social history and family history with the patient and they are unchanged from previous note.  ALLERGIES:  is allergic to chlorhexidine gluconate and latex.  MEDICATIONS:  Current Outpatient Medications  Medication Sig Dispense Refill  . tamoxifen (NOLVADEX) 20 MG tablet TAKE 1 TABLET(20 MG) BY MOUTH DAILY 90 tablet 0   No current facility-administered medications for this visit.     PHYSICAL EXAMINATION: ECOG PERFORMANCE STATUS: 1 - Symptomatic but completely ambulatory  Vitals:  07/05/18 1409  BP: 104/70  Pulse: 77  Resp: 20  Temp: 98.6 F (37 C)  SpO2: 100%   Filed Weights   07/05/18 1409  Weight: 162 lb 8 oz (73.7 kg)    GENERAL:alert, no distress and comfortable SKIN: skin color, texture, turgor are normal, no rashes or significant lesions EYES: normal,  Conjunctiva are pink and non-injected, sclera clear OROPHARYNX:no exudate, no erythema and lips, buccal mucosa, and tongue normal  NECK: supple, thyroid normal size, non-tender, without nodularity LYMPH:  no palpable lymphadenopathy in the cervical, axillary or inguinal LUNGS: clear to auscultation and percussion with normal breathing effort HEART: regular rate & rhythm and no murmurs and no lower extremity edema ABDOMEN:abdomen soft, non-tender and normal bowel sounds MUSCULOSKELETAL:no cyanosis of digits and no clubbing  NEURO: alert & oriented x 3 with fluent speech, no focal motor/sensory deficits EXTREMITIES: No lower extremity edema BREAST: No palpable masses or nodules in either right or left breasts. No palpable axillary supraclavicular or infraclavicular adenopathy no breast tenderness or nipple discharge. (exam performed in the presence of a chaperone)  LABORATORY DATA:  I have reviewed the data as listed CMP Latest Ref Rng & Units 09/09/2016 08/20/2016 07/29/2016  Glucose 70 - 140 mg/dl 109 126 114  BUN 7.0 - 26.0 mg/dL 11.7 15.9 12.4  Creatinine 0.6 - 1.1 mg/dL 0.9 1.1 0.9  Sodium 136 - 145 mEq/L 144 143 142  Potassium 3.5 - 5.1 mEq/L 3.9 4.0 4.0  Chloride 101 - 111 mmol/L - - -  CO2 22 - 29 mEq/L _0 Calcium 8.4 - 10.4 mg/dL 8.6 9.0 9.0  Total Protein 6.4 - 8.3 g/dL 6.2(L) 6.7 6.6  Total Bilirubin 0.20 - 1.20 mg/dL 0.68 0.75 0.49  Alkaline Phos 40 - 150 U/L 47 50 52  AST 5 - 34 U/L _1 ALT 0 - 55 U/L _2 Lab Results  Component Value Date   WBC 8.6 09/09/2016   HGB 13.0 09/09/2016   HCT 38.3 09/09/2016   MCV 86.7 09/09/2016   PLT 220 09/09/2016   NEUTROABS 4.8 09/09/2016    ASSESSMENT & PLAN:  Malignant neoplasm of upper-outer quadrant of right breast in female, estrogen receptor positive ([place]) 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 Adjuvant Anti estrogen therapy with Tamoxifen 20 mg daily X 10 years started 02/18/2016 2. Herceptin maintenance every 3 weeks Completed October 2017. --------------------------------------------------------------------------------------------------------- Tamoxifen toxicities: 1. Hot flashes: They have improved over time. 2. Weight gain: she lost significant weight but then she gained it back . She hasn't been exercising. She has gone to [facility] and had a great time.  Surveillance: 1. Mammogram  12/31/17 Benign; 7 mm microcycts at 4 o clock position 2. breast exam 07/05/18: No palpable lumps or nodules of concern.  RTC in 1 year for follow up    No orders of the defined types were placed in this encounter.  The patient has a good understanding of the overall  plan. she agrees with it. she will call with any problems that may develop before the next visit here.   Harriette Ohara, MD 07/05/18

## 2018-07-05 NOTE — Telephone Encounter (Signed)
Gave avs and calendar ° °

## 2018-10-10 ENCOUNTER — Other Ambulatory Visit: Payer: Self-pay | Admitting: Hematology and Oncology

## 2018-10-10 DIAGNOSIS — Z170 Estrogen receptor positive status [ER+]: Secondary | ICD-10-CM

## 2018-10-10 DIAGNOSIS — C50411 Malignant neoplasm of upper-outer quadrant of right female breast: Secondary | ICD-10-CM

## 2019-04-12 ENCOUNTER — Other Ambulatory Visit: Payer: Self-pay | Admitting: Hematology and Oncology

## 2019-04-12 DIAGNOSIS — Z1231 Encounter for screening mammogram for malignant neoplasm of breast: Secondary | ICD-10-CM

## 2019-04-15 ENCOUNTER — Other Ambulatory Visit: Payer: Self-pay

## 2019-04-15 ENCOUNTER — Ambulatory Visit
Admission: RE | Admit: 2019-04-15 | Discharge: 2019-04-15 | Disposition: A | Discharge status: Home / Self Care | Payer: BLUE CROSS/BLUE SHIELD | Source: Ambulatory Visit | Attending: Hematology and Oncology | Admitting: Hematology and Oncology

## 2019-04-15 ENCOUNTER — Other Ambulatory Visit: Payer: Self-pay | Admitting: Hematology and Oncology

## 2019-04-15 DIAGNOSIS — Z1231 Encounter for screening mammogram for malignant neoplasm of breast: Secondary | ICD-10-CM

## 2019-04-19 ENCOUNTER — Other Ambulatory Visit: Payer: Self-pay | Admitting: Hematology and Oncology

## 2019-04-19 DIAGNOSIS — R928 Other abnormal and inconclusive findings on diagnostic imaging of breast: Secondary | ICD-10-CM

## 2019-04-29 ENCOUNTER — Ambulatory Visit
Admission: RE | Admit: 2019-04-29 | Discharge: 2019-04-29 | Disposition: A | Discharge status: Home / Self Care | Payer: BLUE CROSS/BLUE SHIELD | Source: Ambulatory Visit | Attending: Hematology and Oncology | Admitting: Hematology and Oncology

## 2019-04-29 ENCOUNTER — Other Ambulatory Visit: Payer: Self-pay

## 2019-04-29 DIAGNOSIS — R928 Other abnormal and inconclusive findings on diagnostic imaging of breast: Secondary | ICD-10-CM

## 2019-05-02 ENCOUNTER — Other Ambulatory Visit: Payer: Self-pay | Admitting: *Deleted

## 2019-05-02 DIAGNOSIS — N6320 Unspecified lump in the left breast, unspecified quadrant: Secondary | ICD-10-CM

## 2019-05-04 ENCOUNTER — Other Ambulatory Visit: Payer: BLUE CROSS/BLUE SHIELD

## 2019-05-11 ENCOUNTER — Telehealth: Payer: Self-pay | Admitting: Hematology and Oncology

## 2019-05-11 NOTE — Telephone Encounter (Signed)
Spoke with the patient to advise her a new appointment for her MRI will be happening soon, I advised her to expect a call, patient stated she was having surgery on the 16th of this month, will try and get MRI before surgery. Spoke with Denton Ar from GI who said she would give the patient a call to get her scheduled

## 2019-05-23 ENCOUNTER — Other Ambulatory Visit: Payer: BLUE CROSS/BLUE SHIELD

## 2019-05-26 ENCOUNTER — Other Ambulatory Visit: Payer: Self-pay

## 2019-05-26 ENCOUNTER — Ambulatory Visit
Admission: RE | Admit: 2019-05-26 | Discharge: 2019-05-26 | Disposition: A | Discharge status: Home / Self Care | Payer: BC Managed Care – PPO | Source: Ambulatory Visit | Attending: Hematology and Oncology | Admitting: Hematology and Oncology

## 2019-05-26 ENCOUNTER — Other Ambulatory Visit: Payer: Self-pay | Admitting: Hematology and Oncology

## 2019-05-26 DIAGNOSIS — N6320 Unspecified lump in the left breast, unspecified quadrant: Secondary | ICD-10-CM

## 2019-05-31 ENCOUNTER — Other Ambulatory Visit: Payer: Self-pay

## 2019-05-31 ENCOUNTER — Ambulatory Visit
Admission: RE | Admit: 2019-05-31 | Discharge: 2019-05-31 | Disposition: A | Discharge status: Home / Self Care | Payer: BC Managed Care – PPO | Source: Ambulatory Visit | Attending: Hematology and Oncology | Admitting: Hematology and Oncology

## 2019-05-31 DIAGNOSIS — N6320 Unspecified lump in the left breast, unspecified quadrant: Secondary | ICD-10-CM

## 2019-05-31 MED ORDER — GADOBENATE DIMEGLUMINE 529 MG/ML IV SOLN
15.0000 mL | Freq: Once | INTRAVENOUS | Status: AC | PRN
  Administered 2019-05-31: 15 mL via INTRAVENOUS

## 2019-06-01 ENCOUNTER — Telehealth: Payer: Self-pay | Admitting: Hematology and Oncology

## 2019-06-01 NOTE — Telephone Encounter (Signed)
I left a voicemail for the patient to inform her that the MRI breast did not show any evidence of breast cancer.

## 2019-06-02 ENCOUNTER — Other Ambulatory Visit: Payer: BLUE CROSS/BLUE SHIELD

## 2019-06-16 ENCOUNTER — Telehealth: Payer: Self-pay | Admitting: Hematology and Oncology

## 2019-06-16 NOTE — Telephone Encounter (Signed)
[place] 8/5 moved f/u to PM. Confirmed with patient.

## 2019-06-17 ENCOUNTER — Other Ambulatory Visit: Payer: BC Managed Care – PPO

## 2019-06-29 ENCOUNTER — Telehealth: Payer: Self-pay | Admitting: Hematology and Oncology

## 2019-06-29 NOTE — Telephone Encounter (Signed)
I talk with patient regarding dox visit

## 2019-06-29 NOTE — Assessment & Plan Note (Signed)
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 Adjuvant Anti estrogen therapy with Tamoxifen 20 mg daily X 10 years started 02/18/2016 2. Herceptin maintenance every 3 weeksCompletedOctober 2017. --------------------------------------------------------------------------------------------------------- Tamoxifen toxicities: 1. Hot flashes: They have improved over time. 2. Weight gain: she lost significant weight but then she gained it back .She hasn't been exercising. She has gone to [facility] and had a great time.  Surveillance: 1.Mammogram  12/31/17 Benign; 7 mm microcycts at 4 o clock position 2.breast exam 07/05/18: No palpable lumps or nodules of concern. 3. Breast MRI: 05/31/2019: Benign  RTC in 1 year for follow up

## 2019-06-30 ENCOUNTER — Other Ambulatory Visit: Payer: BC Managed Care – PPO

## 2019-07-05 ENCOUNTER — Telehealth: Payer: Self-pay | Admitting: Hematology and Oncology

## 2019-07-05 NOTE — Progress Notes (Signed)
HEMATOLOGY-ONCOLOGY DOXIMITY VISIT PROGRESS NOTE  I connected with Lori Dodson on 07/06/2019 at  3:00 PM EDT by Doximity video conference and verified that I am speaking with the correct person using two identifiers.  I discussed the limitations, risks, security and privacy concerns of performing an evaluation and management service by Doximity and the availability of in person appointments.  I also discussed with the patient that there may be a patient responsible charge related to this service. The patient expressed understanding and agreed to proceed.  Patient's Location: Home Physician Location: Clinic  CHIEF COMPLIANT: Follow-up of right breast cancer on tamoxifen therapy  INTERVAL HISTORY: Lori Dodson is a 33 y.o. female with above-mentioned history of right breast cancer treated with neoadjuvant chemotherapy followed by mastectomy and is currently on anti-estrogen therapy with tamoxifen. I last saw her a year ago. Mammogram on 04/15/19 showed an asymmetry in the left breast. Diagnostic mammogram on 04/29/19 showed the mass favored to be a cyst. Breast MRI on 05/31/19 showed post-operative right breast changes and no evidence of malignancy bilaterally. She presents over Doximity today for annual follow-up.   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%, HER-2 positive ratio 5.61; right biopsy 8:00: IDC grade 3, ER 50%, PR 5%, Ki-67 80%, HER-2 positive ratio 5.38   09/11/2015 Breast MRI   Right breast middle depth: 2.7 cm; right breast posterior outer: 4.4 cm; right breast retroareolar 2.7 cm; probable right axillary and  right axillary tail lymphadenopathy   09/18/2015 - 01/01/2016 Neo-Adjuvant Chemotherapy   TCH Perjeta 6   01/09/2016 Breast MRI   Significantly improved enhancement in Rt breast, small enh in central portion, No susp LN   01/22/2016 - 09/09/2016 Chemotherapy   Herceptin maintenance to total one year of therapy   01/24/2016 Genetic Testing   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  Genes analyzed:  APC, ATM, AXIN2, BARD1, BMPR1A, BRCA1, BRCA2, BRIP1, CDH1, CDKN2A, CHEK2, DICER1, EPCAM, GREM1, KIT, MEN1, MLH1, MSH2, MSH6, MUTYH, NBN, NF1, PALB2, PDGFRA, PMS2, POLD1, POLE, PTEN, RAD50, RAD51C, RAD51D, SDHA, SDHB, SDHC, SDHD, SMAD4, SMARCA4, STK11, TP53, TSC1, TSC2, and VHL.   02/11/2016 Surgery   Rt Mastectomy Marlou Starks): No residual cancer 0/16 LN Negative for cancer.   02/18/2016 -  Anti-estrogen oral therapy   Tamoxifen 20 mg daily, planned duration of therapy: 10 years.      REVIEW OF SYSTEMS:   Constitutional: Denies fevers, chills or abnormal weight loss Eyes: Denies blurriness of vision Ears, nose, mouth, throat, and face: Denies mucositis or sore throat Respiratory: Denies cough, dyspnea or wheezes Cardiovascular: Denies palpitation, chest discomfort Gastrointestinal:  Denies nausea, heartburn or change in bowel habits Skin: Denies abnormal skin rashes Lymphatics: Denies new lymphadenopathy or easy bruising Neurological:Denies numbness, tingling or new weaknesses Behavioral/Psych: Mood is stable, no new changes  Extremities: No lower extremity edema Breast: denies any pain or lumps or nodules in either breasts All other systems were reviewed with the patient and are negative.  Observations/Objective:  There were no vitals filed for this visit. There is no height or weight on file to calculate BMI.  I have reviewed the data as listed CMP Latest Ref Rng &  Units 09/09/2016 08/20/2016 07/29/2016  Glucose 70 - 140 mg/dl 109 126 114  BUN 7.0 - 26.0 mg/dL 11.7 15.9  12.4  Creatinine 0.6 - 1.1 mg/dL 0.9 1.1 0.9  Sodium 136 - 145 mEq/L 144 143 142  Potassium 3.5 - 5.1 mEq/L 3.9 4.0 4.0  Chloride 101 - 111 mmol/L - - -  CO2 22 - 29 mEq/L 25 24 23   Calcium 8.4 - 10.4 mg/dL 8.6 9.0 9.0  Total Protein 6.4 - 8.3 g/dL 6.2(L) 6.7 6.6  Total Bilirubin 0.20 - 1.20 mg/dL 0.68 0.75 0.49  Alkaline Phos 40 - 150 U/L 47 50 52  AST 5 - 34 U/L 13 13 14   ALT 0 - 55 U/L 10 13 12     Lab Results  Component Value Date   WBC 8.6 09/09/2016   HGB 13.0 09/09/2016   HCT 38.3 09/09/2016   MCV 86.7 09/09/2016   PLT 220 09/09/2016   NEUTROABS 4.8 09/09/2016      Assessment Plan:  Malignant neoplasm of upper-outer quadrant of right breast in female, estrogen receptor positive ([place]) 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 Adjuvant Anti estrogen therapy with Tamoxifen 20 mg daily X 10 years started 02/18/2016 2. Herceptin maintenance every 3 weeksCompletedOctober 2017. --------------------------------------------------------------------------------------------------------- Tamoxifen toxicities: 1. Hot flashes: stopped 2. Weight gain: she lost significant weight but then she gained it back .She hasn't been exercising. She loves going to [place].  Surveillance: 1.Mammogram  04/29/2019 left breast Benign 2.breast exam 07/05/18: No palpable lumps or nodules of concern. 3. Breast MRI: 05/31/2019: Benign   I ordered the breast MRI for 2021. RTC in 1 year for follow up    I discussed the assessment and treatment plan with the patient. The patient was provided an opportunity to ask questions and all were answered. The patient agreed with the plan and demonstrated an understanding of the instructions. The patient was advised to call back or seek an in-person evaluation if the symptoms worsen or if the condition fails to improve as anticipated.   I provided 15 minutes of face-to-face Doximity time during this encounter.    Rulon Eisenmenger, MD 07/06/2019   I, Molly Dorshimer, am acting as scribe for Nicholas Lose, MD.  I have reviewed the above documentation for accuracy and completeness, and I agree with the above.

## 2019-07-05 NOTE — Telephone Encounter (Signed)
Left voicemail to confirm appt and verify info. °

## 2019-07-06 ENCOUNTER — Inpatient Hospital Stay: Payer: BC Managed Care – PPO | Attending: Hematology and Oncology | Admitting: Hematology and Oncology

## 2019-07-06 DIAGNOSIS — C50411 Malignant neoplasm of upper-outer quadrant of right female breast: Secondary | ICD-10-CM | POA: Diagnosis not present

## 2019-07-06 DIAGNOSIS — Z170 Estrogen receptor positive status [ER+]: Secondary | ICD-10-CM

## 2019-07-06 DIAGNOSIS — Z9011 Acquired absence of right breast and nipple: Secondary | ICD-10-CM | POA: Diagnosis not present

## 2019-07-06 DIAGNOSIS — Z79810 Long term (current) use of selective estrogen receptor modulators (SERMs): Secondary | ICD-10-CM | POA: Diagnosis not present

## 2019-07-06 DIAGNOSIS — Z1231 Encounter for screening mammogram for malignant neoplasm of breast: Secondary | ICD-10-CM

## 2019-07-06 DIAGNOSIS — Z9221 Personal history of antineoplastic chemotherapy: Secondary | ICD-10-CM

## 2019-07-06 MED ORDER — TAMOXIFEN CITRATE 20 MG PO TABS: ORAL_TABLET | ORAL | 3 refills | Status: DC

## 2019-07-27 ENCOUNTER — Other Ambulatory Visit: Payer: Self-pay | Admitting: Hematology and Oncology

## 2019-07-27 DIAGNOSIS — C50411 Malignant neoplasm of upper-outer quadrant of right female breast: Secondary | ICD-10-CM

## 2020-05-31 ENCOUNTER — Other Ambulatory Visit: Payer: Self-pay | Admitting: *Deleted

## 2020-05-31 ENCOUNTER — Other Ambulatory Visit: Payer: Self-pay | Admitting: Hematology and Oncology

## 2020-05-31 DIAGNOSIS — C50411 Malignant neoplasm of upper-outer quadrant of right female breast: Secondary | ICD-10-CM

## 2020-05-31 DIAGNOSIS — Z170 Estrogen receptor positive status [ER+]: Secondary | ICD-10-CM

## 2020-06-15 ENCOUNTER — Ambulatory Visit
Admission: RE | Admit: 2020-06-15 | Discharge: 2020-06-15 | Disposition: A | Discharge status: Home / Self Care | Payer: BC Managed Care – PPO | Source: Ambulatory Visit | Attending: Hematology and Oncology | Admitting: Hematology and Oncology

## 2020-06-15 ENCOUNTER — Other Ambulatory Visit: Payer: Self-pay

## 2020-06-15 DIAGNOSIS — Z170 Estrogen receptor positive status [ER+]: Secondary | ICD-10-CM

## 2020-06-20 NOTE — Progress Notes (Signed)
Patient Care Team: Patient, [name] as PCP - General (General Practice)  DIAGNOSIS:    ICD-10-CM   1. 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%, HER-2 positive ratio 5.61; right biopsy 8:00: IDC grade 3, ER 50%, PR 5%, Ki-67 80%, HER-2 positive ratio 5.38   09/11/2015 Breast MRI   Right breast middle depth: 2.7 cm; right breast posterior outer: 4.4 cm; right breast retroareolar 2.7 cm; probable right axillary and right axillary tail lymphadenopathy   09/18/2015 - 01/01/2016 Neo-Adjuvant Chemotherapy   TCH Perjeta 6   01/09/2016 Breast MRI   Significantly improved enhancement in Rt breast, small enh in central portion, No susp LN   01/22/2016 - 09/09/2016 Chemotherapy   Herceptin maintenance to total one year of therapy   01/24/2016 Genetic Testing   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  Genes analyzed:  APC, ATM, AXIN2, BARD1, BMPR1A, BRCA1, BRCA2, BRIP1, CDH1, CDKN2A, CHEK2, DICER1, EPCAM, GREM1, KIT, MEN1, MLH1, MSH2, MSH6, MUTYH, NBN, NF1, PALB2, PDGFRA, PMS2, POLD1, POLE, PTEN, RAD50, RAD51C, RAD51D, SDHA, SDHB, SDHC, SDHD, SMAD4, SMARCA4, STK11, TP53, TSC1, TSC2, and VHL.   02/11/2016 Surgery   Rt Mastectomy Marlou Starks): No residual cancer 0/16 LN Negative for cancer.   02/18/2016 -  Anti-estrogen oral therapy   Tamoxifen 20 mg daily, planned duration of therapy: 10 years.      CHIEF COMPLIANT: Follow-up of right breast cancer on tamoxifen  therapy  INTERVAL HISTORY: Lori Dodson is a 34 y.o. with above-mentioned history of right breast cancer treated with neoadjuvant chemotherapy, mastectomy, and is currently on anti-estrogen therapy with tamoxifen. Mammogram on 06/15/20 showed no evidence of malignancy bilaterally. She presents to the clinic today for annual follow-up.    ALLERGIES:  is allergic to chlorhexidine gluconate, latex, and gadavist [gadobutrol].  MEDICATIONS:  Current Outpatient Medications  Medication Sig Dispense Refill  . tamoxifen (NOLVADEX) 20 MG tablet TAKE 1 TABLET BY MOUTH EVERY DAY 90 tablet 3   No current facility-administered medications for this visit.    PHYSICAL EXAMINATION: ECOG PERFORMANCE STATUS: 1 - Symptomatic but completely ambulatory  There were no vitals filed for this visit. There were no vitals filed for this visit.  BREAST: No palpable masses or nodules in either right or left breasts. No palpable axillary supraclavicular or infraclavicular adenopathy no breast tenderness or nipple discharge. (exam performed in the presence of a chaperone)  LABORATORY DATA:  I have reviewed the data as listed CMP Latest Ref Rng & Units 09/09/2016 08/20/2016 07/29/2016  Glucose 70 - 140 mg/dl 109 126 114  BUN 7.0 - 26.0 mg/dL 11.7 15.9 12.4  Creatinine 0.6 - 1.1 mg/dL 0.9 1.1 0.9  Sodium 136 - 145 mEq/L 144 143 142  Potassium 3.5 - 5.1 mEq/L 3.9 4.0 4.0  Chloride 101 - 111 mmol/L - - -  CO2 22 - 29 mEq/L _0 Calcium 8.4 - 10.4 mg/dL 8.6 9.0 9.0  Total Protein 6.4 - 8.3 g/dL 6.2(L) 6.7 6.6  Total Bilirubin 0.20 -  1.20 mg/dL 0.68 0.75 0.49  Alkaline Phos 40 - 150 U/L 47 50 52  AST 5 - 34 U/L _0 ALT 0 - 55 U/L _1 Lab Results  Component Value Date   WBC 8.6 09/09/2016   HGB 13.0 09/09/2016   HCT 38.3 09/09/2016   MCV 86.7 09/09/2016   PLT 220 09/09/2016   NEUTROABS 4.8 09/09/2016    ASSESSMENT & PLAN:  Malignant neoplasm of upper-outer quadrant of right breast in  female, estrogen receptor positive ([place]) 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 Adjuvant Anti estrogen therapy with Tamoxifen 20 mg daily X 10 years started 02/18/2016 2. Herceptin maintenance every 3 weeksCompletedOctober 2017. --------------------------------------------------------------------------------------------------------- Tamoxifen toxicities: 1. Hot flashes: stopped 2. Weight gain: She is not exercising regularly.. She loves going to [place]. Constipation: I discussed with her about drinking 8 to 10 glasses of water per day and eating more fiber in her diet and reducing fats and simple carbs.  Surveillance: 1.Mammogramand ultrasound 06/15/2020: No evidence of malignancy.  Resolution of the previously identified subareolar complicated cyst  2.breast exam7/22/2021: No palpable lumps or nodules of concern. 3. Breast MRI: 05/31/2019: Benign  I ordered the breast MRI for 2021.  She has not done this.  I will reorder the MRI to be done in the next month. RTC in 1 year for follow up    No orders of the defined types were placed in this encounter.  The patient has a good understanding of the overall plan. she agrees with it. she will call with any problems that may develop before the next visit here.  Total time spent: 20 mins including face to face time and time spent for planning, charting and coordination of  care  Nicholas Lose, MD 06/21/2020  I, Cloyde Reams Dorshimer, am acting as scribe for Dr. Nicholas Lose.  I have reviewed the above documentation for accuracy and completeness, and I agree with the above.

## 2020-06-20 NOTE — Assessment & Plan Note (Signed)
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 Adjuvant Anti estrogen therapy with Tamoxifen 20 mg daily X 10 years started 02/18/2016 2. Herceptin maintenance every 3 weeksCompletedOctober 2017. --------------------------------------------------------------------------------------------------------- Tamoxifen toxicities: 1. Hot flashes: stopped 2. Weight gain: she lost significant weight but then she gained it back .She hasn't been exercising. She loves going to [place].  Surveillance: 1.Mammogramand ultrasound 06/15/2020: No evidence of malignancy.  Resolution of the previously identified subareolar complicated cyst  2.breast exam7/22/2021: No palpable lumps or nodules of concern. 3. Breast MRI: 05/31/2019: Benign  I ordered the breast MRI for 2021.  She has not done this. RTC in 1 year for follow up

## 2020-06-21 ENCOUNTER — Other Ambulatory Visit: Payer: Self-pay

## 2020-06-21 ENCOUNTER — Inpatient Hospital Stay: Payer: BC Managed Care – PPO | Attending: Hematology and Oncology | Admitting: Hematology and Oncology

## 2020-06-21 DIAGNOSIS — Z79810 Long term (current) use of selective estrogen receptor modulators (SERMs): Secondary | ICD-10-CM | POA: Insufficient documentation

## 2020-06-21 DIAGNOSIS — Z170 Estrogen receptor positive status [ER+]: Secondary | ICD-10-CM | POA: Diagnosis not present

## 2020-06-21 DIAGNOSIS — Z9011 Acquired absence of right breast and nipple: Secondary | ICD-10-CM | POA: Insufficient documentation

## 2020-06-21 DIAGNOSIS — C50411 Malignant neoplasm of upper-outer quadrant of right female breast: Secondary | ICD-10-CM | POA: Insufficient documentation

## 2020-06-21 MED ORDER — TAMOXIFEN CITRATE 20 MG PO TABS: ORAL_TABLET | ORAL | 3 refills | Status: DC

## 2020-07-20 ENCOUNTER — Ambulatory Visit
Admission: RE | Admit: 2020-07-20 | Discharge: 2020-07-20 | Disposition: A | Discharge status: Home / Self Care | Payer: BC Managed Care – PPO | Source: Ambulatory Visit | Attending: Hematology and Oncology | Admitting: Hematology and Oncology

## 2020-07-20 DIAGNOSIS — C50411 Malignant neoplasm of upper-outer quadrant of right female breast: Secondary | ICD-10-CM

## 2020-07-20 DIAGNOSIS — Z170 Estrogen receptor positive status [ER+]: Secondary | ICD-10-CM

## 2020-07-20 MED ORDER — GADOBENATE DIMEGLUMINE 529 MG/ML IV SOLN
14.0000 mL | Freq: Once | INTRAVENOUS | Status: AC | PRN
  Administered 2020-07-20: 14 mL via INTRAVENOUS

## 2020-07-20 MED ORDER — GADOBUTROL 1 MMOL/ML IV SOLN: 7.0000 mL | Freq: Once | INTRAVENOUS | Status: DC | PRN

## 2020-08-15 ENCOUNTER — Telehealth: Payer: Self-pay | Admitting: *Deleted

## 2020-08-15 ENCOUNTER — Other Ambulatory Visit: Payer: Self-pay | Admitting: Hematology and Oncology

## 2020-08-15 DIAGNOSIS — C50411 Malignant neoplasm of upper-outer quadrant of right female breast: Secondary | ICD-10-CM

## 2020-08-15 NOTE — Telephone Encounter (Signed)
Received VM from pt.  Attempt x1 to return call, no answer, LVM to return call to office.

## 2020-11-26 ENCOUNTER — Encounter: Payer: Self-pay | Admitting: *Deleted

## 2020-11-28 ENCOUNTER — Encounter: Payer: Self-pay | Admitting: *Deleted

## 2021-06-20 ENCOUNTER — Ambulatory Visit: Payer: BC Managed Care – PPO | Admitting: Hematology and Oncology

## 2021-06-21 ENCOUNTER — Ambulatory Visit: Payer: BC Managed Care – PPO | Admitting: Hematology and Oncology

## 2021-08-24 ENCOUNTER — Other Ambulatory Visit: Payer: Self-pay | Admitting: Hematology and Oncology

## 2021-08-24 DIAGNOSIS — C50411 Malignant neoplasm of upper-outer quadrant of right female breast: Secondary | ICD-10-CM

## 2021-08-24 DIAGNOSIS — Z170 Estrogen receptor positive status [ER+]: Secondary | ICD-10-CM

## 2021-09-09 ENCOUNTER — Other Ambulatory Visit: Payer: Self-pay | Admitting: Hematology and Oncology

## 2021-09-09 DIAGNOSIS — Z853 Personal history of malignant neoplasm of breast: Secondary | ICD-10-CM

## 2021-09-09 DIAGNOSIS — Z1231 Encounter for screening mammogram for malignant neoplasm of breast: Secondary | ICD-10-CM

## 2021-10-04 ENCOUNTER — Ambulatory Visit
Admission: RE | Admit: 2021-10-04 | Discharge: 2021-10-04 | Disposition: A | Discharge status: Home / Self Care | Payer: BC Managed Care – PPO | Source: Ambulatory Visit | Attending: Hematology and Oncology | Admitting: Hematology and Oncology

## 2021-10-04 ENCOUNTER — Other Ambulatory Visit: Payer: Self-pay | Admitting: *Deleted

## 2021-10-04 ENCOUNTER — Other Ambulatory Visit: Payer: Self-pay

## 2021-10-04 DIAGNOSIS — Z1231 Encounter for screening mammogram for malignant neoplasm of breast: Secondary | ICD-10-CM

## 2021-10-04 DIAGNOSIS — Z853 Personal history of malignant neoplasm of breast: Secondary | ICD-10-CM

## 2021-10-20 NOTE — Progress Notes (Signed)
Patient Care Team: Patient, [name] (Inactive) as PCP - General (General Practice)  DIAGNOSIS:    ICD-10-CM   1. 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%, HER-2 positive ratio 5.61; right biopsy 8:00: IDC grade 3, ER 50%, PR 5%, Ki-67 80%, HER-2 positive ratio 5.38   09/11/2015 Breast MRI   Right breast middle depth: 2.7 cm; right breast posterior outer: 4.4 cm; right breast retroareolar 2.7 cm; probable right axillary and right axillary tail lymphadenopathy   09/18/2015 - 01/01/2016 Neo-Adjuvant Chemotherapy   TCH Perjeta 6   01/09/2016 Breast MRI   Significantly improved enhancement in Rt breast, small enh in central portion, No susp LN   01/22/2016 - 09/09/2016 Chemotherapy   Herceptin maintenance to total one year of therapy   01/24/2016 Genetic Testing   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  Genes analyzed:  APC, ATM, AXIN2, BARD1, BMPR1A, BRCA1, BRCA2, BRIP1, CDH1, CDKN2A, CHEK2, DICER1, EPCAM, GREM1, KIT, MEN1, MLH1, MSH2, MSH6, MUTYH, NBN, NF1, PALB2, PDGFRA, PMS2, POLD1, POLE, PTEN, RAD50, RAD51C, RAD51D, SDHA, SDHB, SDHC, SDHD, SMAD4, SMARCA4, STK11, TP53, TSC1, TSC2, and VHL.   02/11/2016 Surgery   Rt Mastectomy Marlou Starks): No residual cancer 0/16 LN Negative for cancer.   02/18/2016 -  Anti-estrogen oral therapy   Tamoxifen 20 mg daily, planned duration of therapy: 10 years.      CHIEF COMPLIANT: Follow-up of right breast cancer  on tamoxifen therapy  INTERVAL HISTORY: Lori Dodson is a 35 y.o. with above-mentioned history of right breast cancer treated with neoadjuvant chemotherapy, mastectomy, and is currently on anti-estrogen therapy with tamoxifen. Mammogram on 10/04/2021 showed no evidence of malignancy bilaterally. She presents to the clinic today for annual follow-up.  She does not have any problems tolerating tamoxifen.  ALLERGIES:  is allergic to chlorhexidine gluconate, latex, and gadavist [gadobutrol].  MEDICATIONS:  Current Outpatient Medications  Medication Sig Dispense Refill   tamoxifen (NOLVADEX) 20 MG tablet TAKE 1 TABLET BY MOUTH EVERY DAY 90 tablet 3   No current facility-administered medications for this visit.    PHYSICAL EXAMINATION: ECOG PERFORMANCE STATUS: 1 - Symptomatic but completely ambulatory  Vitals:   10/21/21 1103  BP: 129/74  Pulse: 69  Resp: 17  Temp: (!) 97.3 F (36.3 C)  SpO2: 100%   Filed Weights   10/21/21 1103  Weight: 175 lb 8 oz (79.6 kg)    BREAST: Right breast reconstructed no palpable lumps or nodules.  No palpable lumps or nodules in the left breast.. (exam performed in the presence of a chaperone)  LABORATORY DATA:  I have reviewed the data as listed CMP Latest Ref Rng & Units 09/09/2016 08/20/2016 07/29/2016  Glucose 70 - 140 mg/dl 109 126 114  BUN 7.0 - 26.0 mg/dL 11.7 15.9 12.4  Creatinine 0.6 - 1.1 mg/dL 0.9 1.1 0.9  Sodium 136 - 145 mEq/L 144 143 142  Potassium 3.5 - 5.1 mEq/L 3.9 4.0 4.0  Chloride 101 - 111 mmol/L - - -  CO2  22 - 29 mEq/L 25 24 23   Calcium 8.4 - 10.4 mg/dL 8.6 9.0 9.0  Total Protein 6.4 - 8.3 g/dL 6.2(L) 6.7 6.6  Total Bilirubin 0.20 - 1.20 mg/dL 0.68 0.75 0.49  Alkaline Phos 40 - 150 U/L 47 50 52  AST 5 - 34 U/L 13 13 14   ALT 0 - 55 U/L 10 13 12     Lab Results  Component Value Date   WBC 8.6 09/09/2016   HGB 13.0 09/09/2016   HCT 38.3 09/09/2016   MCV 86.7 09/09/2016   PLT 220 09/09/2016   NEUTROABS 4.8  09/09/2016    ASSESSMENT & PLAN:  Malignant neoplasm of upper-outer quadrant of right breast in female, estrogen receptor positive ([place]) 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 Adjuvant Anti estrogen therapy with Tamoxifen 20 mg daily X 10 years started 02/18/2016 2. Herceptin maintenance every 3 weeks Completed October 2017. --------------------------------------------------------------------------------------------------------- Tamoxifen toxicities: 1. Hot flashes: stopped 2. Weight gain: She is not exercising regularly.     Surveillance: 1. Mammogram 10/04/2021: No evidence of malignancy.    Breast density category C  2. breast exam 10/21/2021: No palpable lumps or nodules of concern. 3. Breast MRI: 07/23/2020 benign   MRIs will be done in 2024 RTC in 1 year for follow up    No orders of the defined types were placed in this encounter.  The patient has a good understanding of the overall plan. she agrees with it. she will call with any problems that may develop before the next visit here.  Total time spent: 20 mins including face to face time and time spent for planning, charting and coordination of care  Rulon Eisenmenger, MD, MPH 10/21/2021  I, Thana Ates, am acting as scribe for Dr. Nicholas Lose.  I have reviewed the above documentation for accuracy and completeness, and I agree with the above.

## 2021-10-21 ENCOUNTER — Inpatient Hospital Stay: Payer: BC Managed Care – PPO | Attending: Hematology and Oncology | Admitting: Hematology and Oncology

## 2021-10-21 ENCOUNTER — Telehealth: Payer: Self-pay | Admitting: Hematology and Oncology

## 2021-10-21 ENCOUNTER — Other Ambulatory Visit: Payer: Self-pay

## 2021-10-21 DIAGNOSIS — Z170 Estrogen receptor positive status [ER+]: Secondary | ICD-10-CM | POA: Diagnosis not present

## 2021-10-21 DIAGNOSIS — Z79810 Long term (current) use of selective estrogen receptor modulators (SERMs): Secondary | ICD-10-CM | POA: Insufficient documentation

## 2021-10-21 DIAGNOSIS — C50411 Malignant neoplasm of upper-outer quadrant of right female breast: Secondary | ICD-10-CM | POA: Insufficient documentation

## 2021-10-21 MED ORDER — TAMOXIFEN CITRATE 20 MG PO TABS: 20.0000 mg | ORAL_TABLET | Freq: Every day | ORAL | 3 refills | Status: DC

## 2021-10-21 NOTE — Assessment & Plan Note (Signed)
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 Adjuvant Anti estrogen therapy with Tamoxifen 20 mg daily X 10 years started 02/18/2016 2. Herceptin maintenance every 3 weeksCompletedOctober 2017. --------------------------------------------------------------------------------------------------------- Tamoxifen toxicities: 1. Hot flashes:stopped 2. Weight gain: She is not exercising regularly..She loves going to [place]. Constipation: I discussed with her about drinking 8 to 10 glasses of water per day and eating more fiber in her diet and reducing fats and simple carbs.  Surveillance: 1.Mammogram11/03/2021: No evidence of malignancy.    Breast density category C  2.breast exam11/21/2022: No palpable lumps or nodules of concern. 3. Breast MRI: 07/23/2020 benign  MRIs will be done every other year. RTC in 1 year for follow up

## 2021-10-21 NOTE — Telephone Encounter (Signed)
Scheduled appointment per 11/21 los. Patient is aware. 

## 2022-02-22 ENCOUNTER — Other Ambulatory Visit: Payer: Self-pay

## 2022-02-22 ENCOUNTER — Emergency Department (HOSPITAL_COMMUNITY)
Admission: EM | Admit: 2022-02-22 | Discharge: 2022-02-23 | Disposition: A | Discharge status: Home / Self Care | Payer: BC Managed Care – PPO | Attending: Emergency Medicine | Admitting: Emergency Medicine

## 2022-02-22 DIAGNOSIS — L0291 Cutaneous abscess, unspecified: Secondary | ICD-10-CM

## 2022-02-22 DIAGNOSIS — Z91040 Latex allergy status: Secondary | ICD-10-CM | POA: Insufficient documentation

## 2022-02-22 DIAGNOSIS — E875 Hyperkalemia: Secondary | ICD-10-CM | POA: Diagnosis not present

## 2022-02-22 DIAGNOSIS — R1033 Periumbilical pain: Secondary | ICD-10-CM | POA: Insufficient documentation

## 2022-02-22 DIAGNOSIS — R109 Unspecified abdominal pain: Secondary | ICD-10-CM | POA: Diagnosis present

## 2022-02-22 LAB — LIPASE, BLOOD: Lipase: 34 U/L (ref 11–51)

## 2022-02-22 LAB — URINALYSIS, ROUTINE W REFLEX MICROSCOPIC
Bilirubin Urine: NEGATIVE
Glucose, UA: NEGATIVE mg/dL
Hgb urine dipstick: NEGATIVE
Ketones, ur: NEGATIVE mg/dL
Leukocytes,Ua: NEGATIVE
Nitrite: NEGATIVE
Protein, ur: NEGATIVE mg/dL
Specific Gravity, Urine: 1.008 (ref 1.005–1.030)
pH: 6 (ref 5.0–8.0)

## 2022-02-22 LAB — COMPREHENSIVE METABOLIC PANEL
ALT: 12 U/L (ref 0–44)
AST: 33 U/L (ref 15–41)
Albumin: 3.4 g/dL — ABNORMAL LOW (ref 3.5–5.0)
Alkaline Phosphatase: 37 U/L — ABNORMAL LOW (ref 38–126)
Anion gap: 7 (ref 5–15)
BUN: 17 mg/dL (ref 6–20)
CO2: 21 mmol/L — ABNORMAL LOW (ref 22–32)
Calcium: 8.7 mg/dL — ABNORMAL LOW (ref 8.9–10.3)
Chloride: 108 mmol/L (ref 98–111)
Creatinine, Ser: 0.9 mg/dL (ref 0.44–1.00)
GFR, Estimated: >60 mL/min (ref >60)
Glucose, Bld: 89 mg/dL (ref 70–99)
Potassium: 5.2 mmol/L — ABNORMAL HIGH (ref 3.5–5.1)
Sodium: 136 mmol/L (ref 135–145)
Total Bilirubin: 1 mg/dL (ref 0.3–1.2)
Total Protein: 6.4 g/dL — ABNORMAL LOW (ref 6.5–8.1)

## 2022-02-22 LAB — PREGNANCY, URINE: Preg Test, Ur: NEGATIVE

## 2022-02-22 MED ORDER — FENTANYL CITRATE PF 50 MCG/ML IJ SOSY
50.0000 ug | PREFILLED_SYRINGE | Freq: Once | INTRAMUSCULAR | Status: AC
  Administered 2022-02-23: 50 ug via INTRAVENOUS
  Filled 2022-02-22: qty 1

## 2022-02-22 NOTE — ED Provider Notes (Signed)
?[facility] ?Provider Note ? ? ?CSN: [id number] ?Arrival date & time: 02/22/22  2038 ? ?  ? ?History ?Chief Complaint  ?Patient presents with  ? Abdominal Pain  ? ? ?Lori Dodson is a 36 y.o. female presenting for abdominal pain is worse over the last couple days.  Patient reports history of a tummy tuck 3 years ago.  She states when she sits up she feels like there is a firm mass in her belly.  She reports history of constipation but had a recent bowel movement.  Denies any dysuria, vaginal discharge, fevers, nausea, vomiting, diarrhea, headache, or chills. ? ? ?Abdominal Pain ? ?  ? ?Home Medications ?Prior to Admission medications   ?Medication Sig Start Date End Date Taking? Authorizing Provider  ?tamoxifen (NOLVADEX) 20 MG tablet Take 1 tablet (20 mg total) by mouth daily. 10/21/21   Nicholas Lose, MD  ?   ? ?Allergies    ?Chlorhexidine gluconate, Latex, and Gadavist [gadobutrol]   ? ?Review of Systems   ?Review of Systems  ?Gastrointestinal:  Positive for abdominal pain.  ? ?Physical Exam ?Updated Vital Signs ?BP (!) 134/97 (BP Location: Left Arm)   Pulse 91   Temp 98.2 ?F (36.8 ?C) (Oral)   Resp 18   SpO2 100%  ?Physical Exam ?Vitals and nursing note reviewed.  ?Constitutional:   ?   Appearance: She is not ill-appearing.  ?Cardiovascular:  ?   Rate and Rhythm: Normal rate and regular rhythm.  ?Pulmonary:  ?   Effort: Pulmonary effort is normal.  ?   Breath sounds: No wheezing.  ?Abdominal:  ?   General: There is no distension or abdominal bruit.  ?   Palpations: There is mass.  ?   Tenderness: There is abdominal tenderness in the periumbilical area. There is no guarding or rebound.  ?   Comments: Scar/thickened tissue palpated around the periumbilical area lateral left periumbilical area most tender  ?Neurological:  ?   General: No focal deficit present.  ?   Mental Status: She is alert and oriented to person, place, and time.  ?Psychiatric:     ?   Mood and  Affect: Mood normal.     ?   Behavior: Behavior normal.  ? ? ?ED Results / Procedures / Treatments   ?Labs ?(all labs ordered are listed, but only abnormal results are displayed) ?Labs Reviewed  ?COMPREHENSIVE METABOLIC PANEL - Abnormal; Notable for the following components:  ?    Result Value  ? Potassium 5.2 (*)   ? CO2 21 (*)   ? Calcium 8.7 (*)   ? Total Protein 6.4 (*)   ? Albumin 3.4 (*)   ? Alkaline Phosphatase 37 (*)   ? All other components within normal limits  ?URINALYSIS, ROUTINE W REFLEX MICROSCOPIC - Abnormal; Notable for the following components:  ? Color, Urine STRAW (*)   ? All other components within normal limits  ?LIPASE, BLOOD  ?CBC WITH DIFFERENTIAL/PLATELET  ?PREGNANCY, URINE  ?I-STAT BETA HCG BLOOD, ED ([facility], [facility], [facility] ONLY)  ? ? ?EKG ?None ? ?Radiology ?No results found. ? ?Procedures ?Procedures  ? ? ?Medications Ordered in ED ?Medications  ?fentaNYL (SUBLIMAZE) injection 50 mcg (has no administration in time range)  ? ? ?ED Course/ Medical Decision Making/ A&P ?  ?                        ?Medical Decision Making ?Amount and/or Complexity of Data  Reviewed ?Labs: ordered. ?Radiology: ordered. ? ?Risk ?Prescription drug management. ? ? ?35 year old female with history of tummy tuck presenting for cute onset of abdominal pain. ? ?No peritoneal signs on physical examination.  Due to concerns of possible hernia versus infection, will obtain a CT abdomen pelvis to rule out any acute abdominal processes or surgical emergencies. ?Labs remarkable for potassium 5.2 but is a hemolyzed specimen.  CO2 of 21.  Lipase of 34.  UA unremarkable.  Pregnancy test pending.  CT abdomen pelvis pending pregnancy test.  Patient given fentanyl for pain control.  Patient's care signed out at 11:40 PM to outgoing team for further management of her care. ? ?Patient seen in conjunction with my attending Dr. Roslynn Amble.  ? ? ?Final Clinical Impression(s) / ED Diagnoses ?Final diagnoses:  ?Periumbilical abdominal pain  ? ? ?Rx /  DC Orders ?ED Discharge Orders   ? ? None  ? ?  ? ? ?  ?Lupita Dawn, MD ?02/22/22 2344 ? ?  ?Lucrezia Starch, MD ?02/23/22 2134 ? ?

## 2022-02-22 NOTE — ED Notes (Signed)
The pt has ahd mid-bd pain for several days nausea no vomiting or diarhea last bm 2 days ago ?

## 2022-02-22 NOTE — ED Provider Notes (Signed)
Care assumed from previous provider. See note for full HPI. ? ?Plan to FU on CT AP ? ? ?Physical Exam  ?BP (!) 134/97 (BP Location: Left Arm)   Pulse 91   Temp 98.2 ?F (36.8 ?C) (Oral)   Resp 18   SpO2 100%  ? ?Physical Exam ?Vitals and nursing note reviewed.  ?Constitutional:   ?   General: She is not in acute distress. ?   Appearance: She is well-developed. She is not ill-appearing.  ?HENT:  ?   Head: Atraumatic.  ?Eyes:  ?   Pupils: Pupils are equal, round, and reactive to light.  ?Cardiovascular:  ?   Rate and Rhythm: Normal rate.  ?Pulmonary:  ?   Effort: No respiratory distress.  ?Abdominal:  ?   General: There is no distension.  ?   Tenderness: There is abdominal tenderness in the periumbilical area.  ?   Comments: Approximately 2 cm area of induration at periumbilical region.   ?Musculoskeletal:     ?   General: Normal range of motion.  ?   Cervical back: Normal range of motion.  ?Skin: ?   General: Skin is warm and dry.  ?Neurological:  ?   General: No focal deficit present.  ?   Mental Status: She is alert.  ?Psychiatric:     ?   Mood and Affect: Mood normal.  ? ? ?Procedures  ?Procedures ?Labs Reviewed  ?COMPREHENSIVE METABOLIC PANEL - Abnormal; Notable for the following components:  ?    Result Value  ? Potassium 5.2 (*)   ? CO2 21 (*)   ? Calcium 8.7 (*)   ? Total Protein 6.4 (*)   ? Albumin 3.4 (*)   ? Alkaline Phosphatase 37 (*)   ? All other components within normal limits  ?URINALYSIS, ROUTINE W REFLEX MICROSCOPIC - Abnormal; Notable for the following components:  ? Color, Urine STRAW (*)   ? All other components within normal limits  ?CBC WITH DIFFERENTIAL/PLATELET - Abnormal; Notable for the following components:  ? WBC 14.9 (*)   ? Neutro Abs 8.0 (*)   ? Lymphs Abs 5.4 (*)   ? Monocytes Absolute 1.2 (*)   ? All other components within normal limits  ?LIPASE, BLOOD  ?PREGNANCY, URINE  ?I-STAT BETA HCG BLOOD, ED ([facility], [facility], [facility] ONLY)  ? CT ABDOMEN PELVIS W CONTRAST ? ?Result Date:  02/23/2022 ?CLINICAL DATA:  Acute abdominal pain EXAM: CT ABDOMEN AND PELVIS WITH CONTRAST TECHNIQUE: Multidetector CT imaging of the abdomen and pelvis was performed using the standard protocol following bolus administration of intravenous contrast. RADIATION DOSE REDUCTION: This exam was performed according to the departmental dose-optimization program which includes automated exposure control, adjustment of the mA and/or kV according to patient size and/or use of iterative reconstruction technique. CONTRAST:  153m OMNIPAQUE IOHEXOL 300 MG/ML  SOLN COMPARISON:  None. FINDINGS: Lower Chest: Normal. Hepatobiliary: Normal hepatic contours. No intra- or extrahepatic biliary dilatation. The gallbladder is normal. Pancreas: Normal pancreas. No ductal dilatation or peripancreatic fluid collection. Spleen: Normal. Adrenals/Urinary Tract: The adrenal glands are normal. No hydronephrosis, nephroureterolithiasis or solid renal mass. The urinary bladder is normal for degree of distention Stomach/Bowel: There is no hiatal hernia. Normal duodenal course and caliber. No small bowel dilatation or inflammation. No focal colonic abnormality. Normal appendix. Vascular/Lymphatic: Normal course and caliber of the major abdominal vessels. No abdominal or pelvic lymphadenopathy. Reproductive: Normal uterus. No adnexal mass. Other: Near the umbilicus is a subcutaneous fluid collection that measures 2.9 x 2.8  cm. There is mild surrounding inflammatory change. Musculoskeletal: No bony spinal canal stenosis or focal osseous abnormality. IMPRESSION: 1. Subcutaneous fluid collection at the umbilicus with mild surrounding inflammatory change, likely abscess. Electronically Signed   By: Ulyses Jarred M.D.   On: 02/23/2022 01:48    ?ED Course / MDM  ?  ? ?Assumed care from previous provider, see note for full HPI. ? ?Patient has some tenderness at her periumbilical region, plan to follow-up on CT scan ? ?CT with subcutaneous fluid collection at  periumbilical with mild surrounding inflammatory changes, suspect abscess. ? ?Discussed results with patient. Pref to FU with Surgery outpatient for drainage. Discussed warm compress, start on abx. She is from [place], [place]. Given rec for outpatient surgeons. ? ?The patient has been appropriately medically screened and/or stabilized in the ED. I have low suspicion for any other emergent medical condition which would require further screening, evaluation or treatment in the ED or require inpatient management. ? ?Patient is hemodynamically stable and in no acute distress.  Patient able to ambulate in department prior to ED.  Evaluation does not show acute pathology that would require ongoing or additional emergent interventions while in the emergency department or further inpatient treatment.  I have discussed the diagnosis with the patient and answered all questions.  Pain is been managed while in the emergency department and patient has no further complaints prior to discharge.  Patient is comfortable with plan discussed in room and is stable for discharge at this time.  I have discussed strict return precautions for returning to the emergency department.  Patient was encouraged to follow-up with PCP/specialist refer to at discharge.  ? ?Medical Decision Making ?Amount and/or Complexity of Data Reviewed ?External Data Reviewed: labs, radiology and notes. ?Labs: ordered. Decision-making details documented in ED Course. ?Radiology: ordered and independent interpretation performed. Decision-making details documented in ED Course. ? ?Risk ?OTC drugs. ?Prescription drug management. ? ? ? ? ? ? ? ?  ?Loredana Medellin A, PA-C ?02/23/22 0501 ? ?  ?Merrily Pew, MD ?02/23/22 [phone] ? ?

## 2022-02-22 NOTE — ED Triage Notes (Signed)
Pt arrives to ED POV ambulatory to room c/o Abdominal Pain. Pt states she had a tummy tuck 2 years ago in [place]. Pt lower abd near umbilicus is swollen and tender to touch. ?

## 2022-02-23 ENCOUNTER — Emergency Department (HOSPITAL_COMMUNITY): Payer: BC Managed Care – PPO

## 2022-02-23 LAB — CBC WITH DIFFERENTIAL/PLATELET
Abs Immature Granulocytes: 0.05 10*3/uL (ref 0.00–0.07)
Basophils Absolute: 0 10*3/uL (ref 0.0–0.1)
Basophils Relative: 0 %
Eosinophils Absolute: 0.3 10*3/uL (ref 0.0–0.5)
Eosinophils Relative: 2 %
HCT: 40.9 % (ref 36.0–46.0)
Hemoglobin: 13.7 g/dL (ref 12.0–15.0)
Immature Granulocytes: 0 %
Lymphocytes Relative: 36 %
Lymphs Abs: 5.4 10*3/uL — ABNORMAL HIGH (ref 0.7–4.0)
MCH: 28.7 pg (ref 26.0–34.0)
MCHC: 33.5 g/dL (ref 30.0–36.0)
MCV: 85.7 fL (ref 80.0–100.0)
Monocytes Absolute: 1.2 10*3/uL — ABNORMAL HIGH (ref 0.1–1.0)
Monocytes Relative: 8 %
Neutro Abs: 8 10*3/uL — ABNORMAL HIGH (ref 1.7–7.7)
Neutrophils Relative %: 54 %
Platelets: 297 10*3/uL (ref 150–400)
RBC: 4.77 MIL/uL (ref 3.87–5.11)
RDW: 13 % (ref 11.5–15.5)
Smear Review: ADEQUATE
WBC: 14.9 10*3/uL — ABNORMAL HIGH (ref 4.0–10.5)
nRBC: 0 % (ref 0.0–0.2)

## 2022-02-23 MED ORDER — OXYCODONE HCL 5 MG PO TABS
5.0000 mg | ORAL_TABLET | ORAL | 0 refills | Status: DC | PRN
Start: 2022-02-23 — End: 2022-03-03

## 2022-02-23 MED ORDER — CEPHALEXIN 500 MG PO CAPS: 500.0000 mg | ORAL_CAPSULE | Freq: Two times a day (BID) | ORAL | 0 refills | Status: DC

## 2022-02-23 MED ORDER — LIDOCAINE HCL (PF) 1 % IJ SOLN: 5.0000 mL | Freq: Once | INTRAMUSCULAR | Status: DC

## 2022-02-23 MED ORDER — IOHEXOL 300 MG/ML  SOLN
100.0000 mL | Freq: Once | INTRAMUSCULAR | Status: AC | PRN
  Administered 2022-02-23: 100 mL via INTRAVENOUS

## 2022-02-23 NOTE — Discharge Instructions (Signed)
Call the surgeons to see about drainage of this abscess.  In the meantime I written you for a few medications.  Take as prescribed. ?

## 2022-02-25 ENCOUNTER — Ambulatory Visit: Payer: BC Managed Care – PPO | Admitting: Surgery

## 2022-02-25 ENCOUNTER — Other Ambulatory Visit: Payer: Self-pay

## 2022-02-25 ENCOUNTER — Encounter: Payer: Self-pay | Admitting: Surgery

## 2022-02-25 VITALS — BP 102/71 | HR 87 | Temp 98.4°F | Resp 16 | Ht 63.0 in | Wt 184.0 lb

## 2022-02-25 DIAGNOSIS — L02211 Cutaneous abscess of abdominal wall: Secondary | ICD-10-CM | POA: Diagnosis not present

## 2022-02-25 NOTE — Progress Notes (Signed)
[facility] History and Physical ? ?Reason for Referral: Peri-umbilical abscess ?Referring Physician: Merrily Pew, MD ? ?Chief Complaint   ?New Patient (Initial Visit) ?  ? ? ?Lori Dodson is a 35 y.o. female.  ?HPI: Patient presents for evaluation of periumbilical abscess.  She first noticed her umbilical area was tender mid last week.  She states that the area is very tender to the touch, and feels significant pressure associated with it.  She denies fevers and chills.  On Saturday, she noted increased pressure with a bulge at her umbilicus, so she presented to the emergency department.  She was evaluated at [facility], at which time she underwent a CT abdomen and pelvis which was concerning for a periumbilical abscess.  She was discharged home on Keflex for 10 days and advised to follow-up with general surgery.  She does have a history of a periumbilical abscess in the past, about 1 year ago.  At that time, it opened and began draining on its own, so the ED opted to not perform any interventions.  She denies any abscesses anywhere else on her body.  Her past medical history significant for right-sided breast cancer.  Her surgical history is significant for multiple right breast surgeries and a tummy tuck performed in [place] 2 years ago.  She denies use of blood thinning medications, tobacco products, and illicit drugs.  She occasionally drinks alcohol. ? ?Past Medical History:  ?Diagnosis Date  ? 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 History of chemotherapy 2016  ? History of right breast cancer 09/2015  ? ? ?Past Surgical History:  ?Procedure Laterality Date  ? BREAST RECONSTRUCTION Right 10/26/2017  ? Procedure: BREAST RECONSTRUCTION;  Surgeon: Cristine Polio, MD;  Location: [place];  Service: Plastics;  Laterality: Right;  ? LEEP    ? MASTECTOMY Right 2017  ? malignant  ? PORT-A-CATH REMOVAL N/A 10/17/2016  ? Procedure: REMOVAL PORT-A-CATH;  Surgeon: Autumn Messing  III, MD;  Location: [place];  Service: General;  Laterality: N/A;  ? PORTA CATH INSERTION  09/17/2015  ? interventional radiology  ? PORTACATH PLACEMENT Left 09/13/2015  ? Procedure: ATTEMPTED INSERTION PORT-A-CATH;  Surgeon: Autumn Messing III, MD;  Location: [place];  Service: General;  Laterality: Left;  ? RADIOACTIVE SEED GUIDED PARTIAL MASTECTOMY WITH AXILLARY SENTINEL LYMPH NODE BIOPSY Right 02/11/2016  ? Procedure: RIGHT MASTECTOMY WITH SENTINEL LYMPH NODE MAPPING AND RADIOACTIVE SEED GUIDED RIGHT NODE EXCISION;  Surgeon: Autumn Messing III, MD;  Location: [place];  Service: General;  Laterality: Right;  ? REMOVAL OF BILATERAL TISSUE EXPANDERS WITH PLACEMENT OF BILATERAL BREAST IMPLANTS Right 12/28/2017  ? Procedure: REMOVAL OF RIGHT TISSUE EXPANDER WITH PLACEMENT WITH GEL BREAST IMPLANT;  Surgeon: Cristine Polio, MD;  Location: [place];  Service: Plastics;  Laterality: Right;  ? TISSUE EXPANDER PLACEMENT Right 10/26/2017  ? Procedure: RIGHT BREAST RECONSTRUCTION WITH PLACEMENT OF TISSUE EXPANDER;  Surgeon: Cristine Polio, MD;  Location: [place];  Service: Plastics;  Laterality: Right;  ? ? ?Family History  ?Problem Relation Age of Onset  ? Endometrial cancer Mother 59  ? Cancer Mother 54  ?     additional cancer from "polyp in groin"  ? Stomach cancer Maternal Grandfather   ?     died a year before pt was born; treated with chemo; metastasis  ? Breast cancer Neg Hx   ? ? ?Social History  ? ?Tobacco Use  ?  Smoking status: Never  ? Smokeless tobacco: Never  ?Vaping Use  ? Vaping Use: Never used  ?Substance Use Topics  ? Alcohol use: Yes  ?  Comment: occasionally   ? Drug use: No  ? ? ?Medications: I have reviewed the patient's current medications. ?Allergies as of 02/25/2022   ? ?   Reactions  ? Chlorhexidine Gluconate Itching  ? Latex Itching, Swelling, Rash  ? Gadavist [gadobutrol] Nausea Only  ? Nausea with gadavist/ ok with multihance//jv  ? ?   ? ?  ?Medication List  ?  ? ?  ? Accurate as of February 25, 2022  3:39 PM. If you have any questions, ask your nurse or doctor.  ?  ?  ? ?  ? ?cephALEXin 500 MG capsule ?Commonly known as: KEFLEX ?Take 1 capsule (500 mg total) by mouth 2 (two) times daily. ?  ?oxyCODONE 5 MG immediate release tablet ?Commonly known as: Roxicodone ?Take 1 tablet (5 mg total) by mouth every 4 (four) hours as needed for severe pain. ?  ?tamoxifen 20 MG tablet ?Commonly known as: NOLVADEX ?Take 1 tablet (20 mg total) by mouth daily. ?What changed: Another medication with the same name was removed. Continue taking this medication, and follow the directions you see here. ?Changed by: Flint Melter Sheyna Pettibone, DO ?  ? ?  ? ? ? ?ROS:  ?Constitutional: negative for chills, fatigue, and fevers ?Eyes: negative for visual disturbance and pain ?Ears, nose, mouth, throat, and face: negative for ear drainage, sore throat, and sinus problems ?Respiratory: negative for cough, wheezing, and shortness of breath ?Cardiovascular: negative for chest pain and palpitations ?Gastrointestinal: positive for abdominal pain, negative for nausea, reflux symptoms, and vomiting ?Genitourinary:negative for dysuria, frequency, and urinary retention ?Integument/breast: positive for abdominal boil, negative for dryness and rash ?Hematologic/lymphatic: negative for bleeding and lymphadenopathy ?Musculoskeletal:negative for back pain, neck pain, and joint pain ?Neurological: negative for dizziness, tremors, and numbness ?Endocrine: negative for temperature intolerance ? ?Blood pressure 102/71, pulse 87, temperature 98.4 ?F (36.9 ?C), temperature source Other (Comment), resp. rate 16, height '5\' 3"'$  (1.6 m), weight 184 lb (83.5 kg), SpO2 98 %. ?Physical Exam ?Vitals reviewed.  ?Constitutional:   ?   Appearance: Normal appearance.  ?HENT:  ?   Head: Normocephalic and atraumatic.  ?Eyes:  ?   Extraocular Movements: Extraocular movements intact.  ?   Pupils: Pupils are equal,  round, and reactive to light.  ?Cardiovascular:  ?   Rate and Rhythm: Normal rate.  ?Pulmonary:  ?   Effort: Pulmonary effort is normal.  ?Abdominal:  ?   Comments: Abdomen soft, nondistended, no percussion tenderness, nontender to deep palpation, umbilicus with duration and midline fluctuance, tender to palpation, no drainage at this time  ?Musculoskeletal:     ?   General: Normal range of motion.  ?   Cervical back: Normal range of motion.  ?Skin: ?   General: Skin is warm and dry.  ?Neurological:  ?   General: No focal deficit present.  ?   Mental Status: She is alert and oriented to person, place, and time.  ?Psychiatric:     ?   Mood and Affect: Mood normal.     ?   Behavior: Behavior normal.  ? ? ?Results: ?No results found for this or any previous visit (from the past 48 hour(s)). ? ?No results found. ? ? ?Assessment & Plan:  ?Lori Dodson is a 36 y.o. female who presents for evaluation of periumbilical abscess ? ?-Imaging evaluated  by myself.  There is a periumbilical fluid collection at the location of her previous umbilicus with surrounding inflammatory changes, concerning for abscess.  This abscess appears to go down to her fascia, but does not appear to enter within the abdomen. ?-I explained that given these imaging findings, I would prefer to perform incision and drainage of her abscess in the operating room, to evaluate her fascia to verify there are no hernia defects and there is no communication of the abscess with her abdominal cavity.  I explained that if a hernia is present, I would primarily repair this, as I cannot use mesh in an infected field.  I further explained that the incision site would be left open and packed, and would likely have to heal by secondary intention given the active infection ?-The risks and benefits of incision and drainage of abdominal abscess, possible umbilical hernia repair were discussed, including but not limited to bleeding, infection, injury to surrounding  structures, and need for additional procedures.  After careful consideration, Lori Dodson has decided to proceed with this procedure. ?-Continue Keflex ?-Tentatively scheduled for Monday, 4/3 ?-[place]

## 2022-02-26 NOTE — H&P (Signed)
[facility] History and Physical ?  ?Reason for Referral: Peri-umbilical abscess ?Referring Physician: Merrily Pew, MD ?  ?Chief Complaint   ?New Patient (Initial Visit) ?   ?  ?  ?Lori Dodson is a 36 y.o. female.  ?HPI: Patient presents for evaluation of periumbilical abscess.  She first noticed her umbilical area was tender mid last week.  She states that the area is very tender to the touch, and feels significant pressure associated with it.  She denies fevers and chills.  On Saturday, she noted increased pressure with a bulge at her umbilicus, so she presented to the emergency department.  She was evaluated at [facility], at which time she underwent a CT abdomen and pelvis which was concerning for a periumbilical abscess.  She was discharged home on Keflex for 10 days and advised to follow-up with general surgery.  She does have a history of a periumbilical abscess in the past, about 1 year ago.  At that time, it opened and began draining on its own, so the ED opted to not perform any interventions.  She denies any abscesses anywhere else on her body.  Her past medical history significant for right-sided breast cancer.  Her surgical history is significant for multiple right breast surgeries and a tummy tuck performed in [place] 2 years ago.  She denies use of blood thinning medications, tobacco products, and illicit drugs.  She occasionally drinks alcohol. ?  ?    ?Past Medical History:  ?Diagnosis Date  ? Breast cancer ([place]) 2016  ?  right DCIS  ? History of chemotherapy 2016  ? History of right breast cancer 09/2015  ?  ?  ?     ?Past Surgical History:  ?Procedure Laterality Date  ? BREAST RECONSTRUCTION Right 10/26/2017  ?  Procedure: BREAST RECONSTRUCTION;  Surgeon: Cristine Polio, MD;  Location: [place];  Service: Plastics;  Laterality: Right;  ? LEEP      ? MASTECTOMY Right 2017  ?  malignant  ? PORT-A-CATH REMOVAL N/A 10/17/2016  ?  Procedure: REMOVAL  PORT-A-CATH;  Surgeon: Autumn Messing III, MD;  Location: [place];  Service: General;  Laterality: N/A;  ? PORTA CATH INSERTION   09/17/2015  ?  interventional radiology  ? PORTACATH PLACEMENT Left 09/13/2015  ?  Procedure: ATTEMPTED INSERTION PORT-A-CATH;  Surgeon: Autumn Messing III, MD;  Location: [place];  Service: General;  Laterality: Left;  ? RADIOACTIVE SEED GUIDED PARTIAL MASTECTOMY WITH AXILLARY SENTINEL LYMPH NODE BIOPSY Right 02/11/2016  ?  Procedure: RIGHT MASTECTOMY WITH SENTINEL LYMPH NODE MAPPING AND RADIOACTIVE SEED GUIDED RIGHT NODE EXCISION;  Surgeon: Autumn Messing III, MD;  Location: [place];  Service: General;  Laterality: Right;  ? REMOVAL OF BILATERAL TISSUE EXPANDERS WITH PLACEMENT OF BILATERAL BREAST IMPLANTS Right 12/28/2017  ?  Procedure: REMOVAL OF RIGHT TISSUE EXPANDER WITH PLACEMENT WITH GEL BREAST IMPLANT;  Surgeon: Cristine Polio, MD;  Location: [place];  Service: Plastics;  Laterality: Right;  ? TISSUE EXPANDER PLACEMENT Right 10/26/2017  ?  Procedure: RIGHT BREAST RECONSTRUCTION WITH PLACEMENT OF TISSUE EXPANDER;  Surgeon: Cristine Polio, MD;  Location: [place];  Service: Plastics;  Laterality: Right;  ?  ?  ?     ?Family History  ?Problem Relation Age of Onset  ? Endometrial cancer Mother 81  ? Cancer Mother 74  ?      additional cancer from "polyp in groin"  ? Stomach cancer Maternal  Grandfather    ?      died a year before pt was born; treated with chemo; metastasis  ? Breast cancer Neg Hx    ?  ?  ?Social History  ?  ?     ?Tobacco Use  ? Smoking status: Never  ? Smokeless tobacco: Never  ?Vaping Use  ? Vaping Use: Never used  ?Substance Use Topics  ? Alcohol use: Yes  ?    Comment: occasionally   ? Drug use: No  ?  ?  ?Medications: I have reviewed the patient's current medications. ?Allergies as of 02/25/2022   ?  ?    Reactions  ?  Chlorhexidine Gluconate Itching  ?  Latex Itching, Swelling, Rash  ?  Gadavist  [gadobutrol] Nausea Only  ?  Nausea with gadavist/ ok with multihance//jv  ?  ?   ?  ?   ?Medication List  ?   ?  ?   ? Accurate as of February 25, 2022  3:39 PM. If you have any questions, ask your nurse or doctor.  ?  ?   ?  ?   ?  ?cephALEXin 500 MG capsule ?Commonly known as: KEFLEX ?Take 1 capsule (500 mg total) by mouth 2 (two) times daily. ?   ?oxyCODONE 5 MG immediate release tablet ?Commonly known as: Roxicodone ?Take 1 tablet (5 mg total) by mouth every 4 (four) hours as needed for severe pain. ?   ?tamoxifen 20 MG tablet ?Commonly known as: NOLVADEX ?Take 1 tablet (20 mg total) by mouth daily. ?What changed: Another medication with the same name was removed. Continue taking this medication, and follow the directions you see here. ?Changed by: Flint Melter Laretta Pyatt, DO ?   ?  ?   ?  ?  ?  ?ROS:  ?Constitutional: negative for chills, fatigue, and fevers ?Eyes: negative for visual disturbance and pain ?Ears, nose, mouth, throat, and face: negative for ear drainage, sore throat, and sinus problems ?Respiratory: negative for cough, wheezing, and shortness of breath ?Cardiovascular: negative for chest pain and palpitations ?Gastrointestinal: positive for abdominal pain, negative for nausea, reflux symptoms, and vomiting ?Genitourinary:negative for dysuria, frequency, and urinary retention ?Integument/breast: positive for abdominal boil, negative for dryness and rash ?Hematologic/lymphatic: negative for bleeding and lymphadenopathy ?Musculoskeletal:negative for back pain, neck pain, and joint pain ?Neurological: negative for dizziness, tremors, and numbness ?Endocrine: negative for temperature intolerance ?  ?Blood pressure 102/71, pulse 87, temperature 98.4 ?F (36.9 ?C), temperature source Other (Comment), resp. rate 16, height '5\' 3"'$  (1.6 m), weight 184 lb (83.5 kg), SpO2 98 %. ?Physical Exam ?Vitals reviewed.  ?Constitutional:   ?   Appearance: Normal appearance.  ?HENT:  ?   Head: Normocephalic and atraumatic.   ?Eyes:  ?   Extraocular Movements: Extraocular movements intact.  ?   Pupils: Pupils are equal, round, and reactive to light.  ?Cardiovascular:  ?   Rate and Rhythm: Normal rate.  ?Pulmonary:  ?   Effort: Pulmonary effort is normal.  ?Abdominal:  ?   Comments: Abdomen soft, nondistended, no percussion tenderness, nontender to deep palpation, umbilicus with duration and midline fluctuance, tender to palpation, no drainage at this time  ?Musculoskeletal:     ?   General: Normal range of motion.  ?   Cervical back: Normal range of motion.  ?Skin: ?   General: Skin is warm and dry.  ?Neurological:  ?   General: No focal deficit present.  ?   Mental Status: She is  alert and oriented to person, place, and time.  ?Psychiatric:     ?   Mood and Affect: Mood normal.     ?   Behavior: Behavior normal.  ?  ?  ?Results: ?Lab Results Last 48 Hours  ?No results found for this or any previous visit (from the past 48 hour(s)).  ? ?  ?Imaging Results (Last 48 hours)  ?No results found.  ? ?  ?  ?Assessment & Plan:  ?Jessenia Filippone is a 36 y.o. female who presents for evaluation of periumbilical abscess ?  ?-Imaging evaluated by myself.  There is a periumbilical fluid collection at the location of her previous umbilicus with surrounding inflammatory changes, concerning for abscess.  This abscess appears to go down to her fascia, but does not appear to enter within the abdomen. ?-I explained that given these imaging findings, I would prefer to perform incision and drainage of her abscess in the operating room, to evaluate her fascia to verify there are no hernia defects and there is no communication of the abscess with her abdominal cavity.  I explained that if a hernia is present, I would primarily repair this, as I cannot use mesh in an infected field.  I further explained that the incision site would be left open and packed, and would likely have to heal by secondary intention given the active infection ?-The risks and benefits  of incision and drainage of abdominal abscess, possible umbilical hernia repair were discussed, including but not limited to bleeding, infection, injury to surrounding structures, and need for additional procedures.

## 2022-02-26 NOTE — Patient Instructions (Signed)
? ? ? ? Lori Dodson ? 02/26/2022  ?  ? '@PREFPERIOPPHARMACY'$ @ ? ? Your procedure is scheduled on  03/03/2022. ? ? Report to [facility] at  0800  A.M. ? ? Call this number if you have problems the morning of surgery: ? [phone] ? ? Remember: ? Do not eat or drink after midnight. ? ?  ? Take these medicines the morning of surgery with A SIP OF WATER  ? ?oxycodone(if needed), tamoxifen. ? ?  ? Do not wear jewelry, make-up or nail polish. ? Do not wear lotions, powders, or perfumes, or deodorant. ? Do not shave 48 hours prior to surgery.  Men may shave face and neck. ? Do not bring valuables to the hospital. ? [facility] is not responsible for any belongings or valuables. ? ?Contacts, dentures or bridgework may not be worn into surgery.  Leave your suitcase in the car.  After surgery it may be brought to your room. ? ?For patients admitted to the hospital, discharge time will be determined by your treatment team. ? ?Patients discharged the day of surgery will not be allowed to drive home and must have someone with them for 24 hours.  ? ? ? ?Special instructions:   DO NOT smoke tobacco or vape for 24 hours before your procedure. ? ?Please read over the following fact sheets that you were given. ?Coughing and Deep Breathing, Surgical Site Infection Prevention, Anesthesia Post-op Instructions, and Care and Recovery After Surgery ?  ? ? ? ?Open Hernia Repair, Adult, Care After ?What can I expect after the procedure? ?After the procedure, it is common to have: ?Mild discomfort. ?Slight bruising. ?Mild swelling. ?Pain in the belly (abdomen). ?A small amount of blood from the cut from surgery (incision). ?Follow these instructions at home: ?Your doctor may give you more specific instructions. If you have problems, call your doctor. ?Medicines ?Take over-the-counter and prescription medicines only as told by your doctor. ?If told, take steps to prevent problems with pooping (constipation). You may need to: ?Drink enough  fluid to keep your pee (urine) pale yellow. ?Take medicines. You will be told what medicines to take. ?Eat foods that are high in fiber. These include beans, whole grains, and fresh fruits and vegetables. ?Limit foods that are high in fat and sugar. These include fried or sweet foods. ?Ask your doctor if you should avoid driving or using machines while you are taking your medicine. ?Incision care ? ?Follow instructions from your doctor about how to take care of your incision. Make sure you: ?Wash your hands with soap and water for at least 20 seconds before and after you change your bandage (dressing). If you cannot use soap and water, use hand sanitizer. ?Change your bandage. ?Leave stitches or skin glue in place for at least 2 weeks. ?Leave tape strips alone unless you are told to take them off. You may trim the edges of the tape strips if they curl up. ?Check your incision every day for signs of infection. Check for: ?More redness, swelling, or pain. ?More fluid or blood. ?Warmth. ?Pus or a bad smell. ?Wear loose, soft clothing while your incision heals. ?Activity ? ?Rest as told by your doctor. ?Do not lift anything that is heavier than 10 lb (4.5 kg), or the limit that you are told. ?Do not play contact sports until your doctor says that this is safe. ?If you were given a sedative during your procedure, do not drive or use machines until your doctor says  that it is safe. A sedative is a medicine that helps you relax. ?Return to your normal activities when your doctor says that it is safe. ?General instructions ?Do not take baths, swim, or use a hot tub. Ask your doctor about taking showers or sponge baths. ?Hold a pillow over your belly when you cough or sneeze. This helps with pain. ?Do not smoke or use any products that contain nicotine or tobacco. If you need help quitting, ask your doctor. ?Keep all follow-up visits. ?Contact a doctor if: ?You have any of these signs of infection in or around your  incision: ?More redness, swelling, or pain. ?More fluid or blood. ?Warmth. ?Pus. ?A bad smell. ?You have a fever or chills. ?You have blood in your poop (stool). ?You have not pooped (had a bowel movement) in 2-3 days. ?Medicine does not help your pain. ?Get help right away if: ?You have chest pain, or you are short of breath. ?You feel faint or light-headed. ?You have very bad pain. ?You vomit and your pain is worse. ?You have pain, swelling, or redness in a leg. ?These symptoms may be an emergency. Get help right away. Call your local emergency services (911 in the [place] ?Do not wait to see if the symptoms will go away. ?Do not drive yourself to the hospital. ?Summary ?After this procedure, it is common to have mild discomfort, slight bruising, and mild swelling. ?Follow instructions from your doctor about how to take care of your cut from surgery (incision). Check every day for signs of infection. ?Do not lift heavy objects or play contact sports until your doctor says it is safe. ?Return to your normal activities as told by your doctor. ?This information is not intended to replace advice given to you by your health care provider. Make sure you discuss any questions you have with your health care provider. ?Document Revised: 07/02/2020 Document Reviewed: 07/02/2020 ?Elsevier Patient Education ? [place]. ? ? General Anesthesia, Adult, Care After ?This sheet gives you information about how to care for yourself after your procedure. Your health care provider may also give you more specific instructions. If you have problems or questions, contact your health care provider. ?What can I expect after the procedure? ?After the procedure, the following side effects are common: ?Pain or discomfort at the IV site. ?Nausea. ?Vomiting. ?Sore throat. ?Trouble concentrating. ?Feeling cold or chills. ?Feeling weak or tired. ?Sleepiness and fatigue. ?Soreness and body aches. These side effects can affect parts of the  body that were not involved in surgery. ?Follow these instructions at home: ?For the time period you were told by your health care provider: ? ?Rest. ?Do not participate in activities where you could fall or become injured. ?Do not drive or use machinery. ?Do not drink alcohol. ?Do not take sleeping pills or medicines that cause drowsiness. ?Do not make important decisions or sign legal documents. ?Do not take care of children on your own. ?Eating and drinking ?Follow any instructions from your health care provider about eating or drinking restrictions. ?When you feel hungry, start by eating small amounts of foods that are soft and easy to digest (bland), such as toast. Gradually return to your regular diet. ?Drink enough fluid to keep your urine pale yellow. ?If you vomit, rehydrate by drinking water, juice, or clear broth. ?General instructions ?If you have sleep apnea, surgery and certain medicines can increase your risk for breathing problems. Follow instructions from your health care provider about wearing your sleep device: ?Anytime  you are sleeping, including during daytime naps. ?While taking prescription pain medicines, sleeping medicines, or medicines that make you drowsy. ?Have a responsible adult stay with you for the time you are told. It is important to have someone help care for you until you are awake and alert. ?Return to your normal activities as told by your health care provider. Ask your health care provider what activities are safe for you. ?Take over-the-counter and prescription medicines only as told by your health care provider. ?If you smoke, do not smoke without supervision. ?Keep all follow-up visits as told by your health care provider. This is important. ?Contact a health care provider if: ?You have nausea or vomiting that does not get better with medicine. ?You cannot eat or drink without vomiting. ?You have pain that does not get better with medicine. ?You are unable to pass  urine. ?You develop a skin rash. ?You have a fever. ?You have redness around your IV site that gets worse. ?Get help right away if: ?You have difficulty breathing. ?You have chest pain. ?You have blood in your urine or s

## 2022-02-27 ENCOUNTER — Encounter (HOSPITAL_COMMUNITY): Payer: Self-pay

## 2022-02-27 ENCOUNTER — Ambulatory Visit: Payer: BC Managed Care – PPO | Admitting: Surgery

## 2022-02-27 ENCOUNTER — Ambulatory Visit (INDEPENDENT_AMBULATORY_CARE_PROVIDER_SITE_OTHER): Payer: BC Managed Care – PPO | Admitting: Surgery

## 2022-02-27 ENCOUNTER — Encounter (HOSPITAL_COMMUNITY)
Admission: RE | Admit: 2022-02-27 | Discharge: 2022-02-27 | Disposition: A | Discharge status: Home / Self Care | Payer: BC Managed Care – PPO | Source: Ambulatory Visit | Attending: Surgery | Admitting: Surgery

## 2022-02-27 ENCOUNTER — Other Ambulatory Visit: Payer: Self-pay

## 2022-02-27 VITALS — BP 92/66 | HR 87 | Temp 97.3°F | Resp 12 | Ht 63.0 in | Wt 184.0 lb

## 2022-02-27 DIAGNOSIS — L02211 Cutaneous abscess of abdominal wall: Secondary | ICD-10-CM

## 2022-02-27 NOTE — Patient Instructions (Signed)
Keep the area clean and covered.  May pack after showering if able.  Ok to let soapy water run over the area.   ?

## 2022-02-27 NOTE — Progress Notes (Signed)
[facility] Note  ? ?HPI:  ?36 y.o. Female presents to clinic for follow-up evaluation of her periumbilical abscess.  She states that this morning, the abscess began draining foul-smelling purulent drainage.  She denies fevers and chills.  She continues to have abdominal tenderness and erythema around the abscess site.  She was scheduled for abscess I&D on 4/3, for evaluation for any underlying hernia defects.  She is continuing to take her Keflex. ? ?Review of Systems:  ?All other review of systems: otherwise negative  ? ?Vital Signs:  ?BP 92/66   Pulse 87   Temp (!) 97.3 ?F (36.3 ?C) (Other (Comment))   Resp 12   Ht '5\' 3"'$  (1.6 m)   Wt 184 lb (83.5 kg)   SpO2 99%   BMI 32.59 kg/m?   ? ?Physical Exam:  ?Physical Exam ?Vitals reviewed.  ?Constitutional:   ?   Appearance: Normal appearance.  ?Abdominal:  ?   Comments: Abdomen soft, nondistended, no percussion tenderness, no tenderness to deep palpation; umbilical abscess with overlying ischemic skin, with opening, serosanguineous drainage, surrounding induration with minimal erythema  ?Neurological:  ?   Mental Status: She is alert.  ? ? ?Laboratory studies: None  ? ?Imaging:  ?None ? ?Assessment:  ?36 y.o. yo Female who presents for follow-up of her periumbilical abscess. ? ?Plan:  ?-The abscess cavity was irrigated and packed with iodoform packing ?-Continue Keflex ?-I advised the patient that we should still proceed with operative evaluation, as I do not want to explore deep within the wound while she is awake.  During operative evaluation, I will be able to resect any devitalized skin to allow for the area to heal better ?-I further explained that now that the abscess is drained, the surrounding induration and erythema will begin to improve ?-Advised her to shower tomorrow, and remove the packing.  I have given her the iodoform packing if she would like to pack the abscess cavity after her shower.  I further advised that she does not need  to pack the wound, as it is widely patent.  I did advise her to keep the area dry and covered ?-We will plan to proceed to the operating room on 4/3 to again evaluate the abscess cavity for any tissue requiring debridement and evaluate for an underlying hernia defect ? ?All of the above recommendations were discussed with the patient, and all of patient's questions were answered to her expressed satisfaction. ? ?Graciella Freer, DO ?[facility] ?[place], [place] [address] ?[phone] (office) ? ? ?

## 2022-03-03 ENCOUNTER — Ambulatory Visit (HOSPITAL_COMMUNITY): Payer: BC Managed Care – PPO | Admitting: Anesthesiology

## 2022-03-03 ENCOUNTER — Other Ambulatory Visit: Payer: Self-pay

## 2022-03-03 ENCOUNTER — Ambulatory Visit (HOSPITAL_COMMUNITY)
Admission: RE | Admit: 2022-03-03 | Discharge: 2022-03-03 | Disposition: A | Discharge status: Home / Self Care | Payer: BC Managed Care – PPO | Source: Ambulatory Visit | Attending: Surgery | Admitting: Surgery

## 2022-03-03 ENCOUNTER — Encounter (HOSPITAL_COMMUNITY): Payer: Self-pay | Admitting: Surgery

## 2022-03-03 ENCOUNTER — Encounter (HOSPITAL_COMMUNITY)
Admission: RE | Disposition: A | Discharge status: Home / Self Care | Payer: Self-pay | Source: Ambulatory Visit | Attending: Surgery

## 2022-03-03 DIAGNOSIS — S31109A Unspecified open wound of abdominal wall, unspecified quadrant without penetration into peritoneal cavity, initial encounter: Secondary | ICD-10-CM | POA: Diagnosis present

## 2022-03-03 DIAGNOSIS — K429 Umbilical hernia without obstruction or gangrene: Secondary | ICD-10-CM | POA: Insufficient documentation

## 2022-03-03 DIAGNOSIS — X58XXXA Exposure to other specified factors, initial encounter: Secondary | ICD-10-CM | POA: Insufficient documentation

## 2022-03-03 DIAGNOSIS — Z853 Personal history of malignant neoplasm of breast: Secondary | ICD-10-CM | POA: Insufficient documentation

## 2022-03-03 DIAGNOSIS — Z9889 Other specified postprocedural states: Secondary | ICD-10-CM | POA: Insufficient documentation

## 2022-03-03 HISTORY — PX: WOUND DEBRIDEMENT: SHX247

## 2022-03-03 HISTORY — PX: UMBILICAL HERNIA REPAIR: SHX196

## 2022-03-03 SURGERY — DEBRIDEMENT, WOUND, ABDOMEN
Anesthesia: General | Site: Abdomen

## 2022-03-03 MED ORDER — DIPHENHYDRAMINE HCL 50 MG/ML IJ SOLN
50.0000 mg | Freq: Once | INTRAMUSCULAR | Status: AC
  Administered 2022-03-03: 50 mg via INTRAVENOUS

## 2022-03-03 MED ORDER — FENTANYL CITRATE (PF) 100 MCG/2ML IJ SOLN
INTRAMUSCULAR | Status: AC
Start: 2022-03-03 — End: ?
  Filled 2022-03-03: qty 2

## 2022-03-03 MED ORDER — DOCUSATE SODIUM 100 MG PO CAPS: 100.0000 mg | ORAL_CAPSULE | Freq: Two times a day (BID) | ORAL | 2 refills | Status: AC

## 2022-03-03 MED ORDER — MIDAZOLAM HCL 2 MG/2ML IJ SOLN
INTRAMUSCULAR | Status: AC
Start: 2022-03-03 — End: ?
  Filled 2022-03-03: qty 2

## 2022-03-03 MED ORDER — OXYCODONE HCL 5 MG PO TABS: 5.0000 mg | ORAL_TABLET | Freq: Four times a day (QID) | ORAL | 0 refills | Status: DC | PRN

## 2022-03-03 MED ORDER — DEXAMETHASONE SODIUM PHOSPHATE 10 MG/ML IJ SOLN
INTRAMUSCULAR | Status: DC | PRN
  Administered 2022-03-03: 10 mg via INTRAVENOUS

## 2022-03-03 MED ORDER — CEFAZOLIN SODIUM-DEXTROSE 2-4 GM/100ML-% IV SOLN
2.0000 g | INTRAVENOUS | Status: AC
  Administered 2022-03-03: 2 g via INTRAVENOUS

## 2022-03-03 MED ORDER — DEXAMETHASONE SODIUM PHOSPHATE 10 MG/ML IJ SOLN
INTRAMUSCULAR | Status: AC
  Filled 2022-03-03: qty 1

## 2022-03-03 MED ORDER — BUPIVACAINE LIPOSOME 1.3 % IJ SUSP
INTRAMUSCULAR | Status: AC
  Filled 2022-03-03: qty 20

## 2022-03-03 MED ORDER — SODIUM CHLORIDE 0.9 % IR SOLN
Status: DC | PRN
  Administered 2022-03-03: 1000 mL

## 2022-03-03 MED ORDER — ONDANSETRON HCL 4 MG/2ML IJ SOLN
INTRAMUSCULAR | Status: DC | PRN
  Administered 2022-03-03: 4 mg via INTRAVENOUS

## 2022-03-03 MED ORDER — ONDANSETRON HCL 4 MG/2ML IJ SOLN: 4.0000 mg | Freq: Once | INTRAMUSCULAR | Status: DC | PRN

## 2022-03-03 MED ORDER — DIPHENHYDRAMINE HCL 50 MG/ML IJ SOLN
INTRAMUSCULAR | Status: AC
  Filled 2022-03-03: qty 1

## 2022-03-03 MED ORDER — MEPERIDINE HCL 50 MG/ML IJ SOLN: 6.2500 mg | INTRAMUSCULAR | Status: DC | PRN

## 2022-03-03 MED ORDER — LIDOCAINE HCL (CARDIAC) PF 100 MG/5ML IV SOSY
PREFILLED_SYRINGE | INTRAVENOUS | Status: DC | PRN
Start: 2022-03-03 — End: 2022-03-03
  Administered 2022-03-03: 50 mg via INTRAVENOUS

## 2022-03-03 MED ORDER — LACTATED RINGERS IV SOLN: INTRAVENOUS | Status: DC

## 2022-03-03 MED ORDER — ONDANSETRON HCL 4 MG/2ML IJ SOLN
INTRAMUSCULAR | Status: AC
Start: 2022-03-03 — End: ?
  Filled 2022-03-03: qty 2

## 2022-03-03 MED ORDER — CEFAZOLIN SODIUM-DEXTROSE 2-4 GM/100ML-% IV SOLN
INTRAVENOUS | Status: AC
  Filled 2022-03-03: qty 100

## 2022-03-03 MED ORDER — CHLORHEXIDINE GLUCONATE 0.12 % MT SOLN
OROMUCOSAL | Status: AC
  Filled 2022-03-03: qty 15

## 2022-03-03 MED ORDER — FENTANYL CITRATE (PF) 100 MCG/2ML IJ SOLN
INTRAMUSCULAR | Status: AC
  Filled 2022-03-03: qty 2

## 2022-03-03 MED ORDER — BACITRACIN 500 UNIT/GM EX OINT
TOPICAL_OINTMENT | CUTANEOUS | Status: DC | PRN
  Administered 2022-03-03: 1 via TOPICAL

## 2022-03-03 MED ORDER — PROPOFOL 10 MG/ML IV BOLUS
INTRAVENOUS | Status: DC | PRN
  Administered 2022-03-03: 200 mg via INTRAVENOUS

## 2022-03-03 MED ORDER — BACITRACIN ZINC 500 UNIT/GM EX OINT
TOPICAL_OINTMENT | CUTANEOUS | Status: AC
  Filled 2022-03-03: qty 0.9

## 2022-03-03 MED ORDER — LIDOCAINE HCL (PF) 2 % IJ SOLN
INTRAMUSCULAR | Status: AC
  Filled 2022-03-03: qty 5

## 2022-03-03 MED ORDER — TRIPLE ANTIBIOTIC 5-400-5000 EX OINT: TOPICAL_OINTMENT | Freq: Every day | CUTANEOUS | 0 refills | Status: DC

## 2022-03-03 MED ORDER — PROPOFOL 10 MG/ML IV BOLUS
INTRAVENOUS | Status: AC
  Filled 2022-03-03: qty 20

## 2022-03-03 MED ORDER — HYDROMORPHONE HCL 1 MG/ML IJ SOLN: 0.2500 mg | INTRAMUSCULAR | Status: DC | PRN

## 2022-03-03 MED ORDER — FENTANYL CITRATE (PF) 100 MCG/2ML IJ SOLN
INTRAMUSCULAR | Status: DC | PRN
  Administered 2022-03-03: 50 ug via INTRAVENOUS
  Administered 2022-03-03: 100 ug via INTRAVENOUS

## 2022-03-03 MED ORDER — BUPIVACAINE LIPOSOME 1.3 % IJ SUSP
INTRAMUSCULAR | Status: DC | PRN
  Administered 2022-03-03: 20 mL

## 2022-03-03 SURGICAL SUPPLY — 34 items
BLADE SURG 15 STRL LF DISP TIS (BLADE) ×2 IMPLANT
BLADE SURG 15 STRL SS (BLADE) ×1
CLOTH BEACON ORANGE TIMEOUT ST (SAFETY) ×3 IMPLANT
COVER LIGHT HANDLE STERIS (MISCELLANEOUS) ×6 IMPLANT
DRSG TELFA 3X8 NADH (GAUZE/BANDAGES/DRESSINGS) ×3 IMPLANT
ELECT REM PT RETURN 9FT ADLT (ELECTROSURGICAL) ×3
ELECTRODE REM PT RTRN 9FT ADLT (ELECTROSURGICAL) ×2 IMPLANT
GAUZE 4X4 16PLY ~~LOC~~+RFID DBL (SPONGE) ×3 IMPLANT
GAUZE SPONGE 4X4 12PLY STRL (GAUZE/BANDAGES/DRESSINGS) ×4 IMPLANT
GLOVE SURG POLYISO LF SZ6.5 (GLOVE) ×2 IMPLANT
GLOVE SURG POLYISO LF SZ7 (GLOVE) ×4 IMPLANT
GLOVE SURG UNDER POLY LF SZ6.5 (GLOVE) ×2 IMPLANT
GLOVE SURG UNDER POLY LF SZ7 (GLOVE) ×9 IMPLANT
GOWN STRL REUS W/TWL LRG LVL3 (GOWN DISPOSABLE) ×8 IMPLANT
INST SET MINOR GENERAL (KITS) ×3 IMPLANT
KIT TURNOVER KIT A (KITS) ×3 IMPLANT
MANIFOLD NEPTUNE II (INSTRUMENTS) ×3 IMPLANT
NDL HYPO 21X1.5 SAFETY (NEEDLE) ×1 IMPLANT
NDL HYPO 25X1 1.5 SAFETY (NEEDLE) ×1 IMPLANT
NEEDLE HYPO 21X1.5 SAFETY (NEEDLE) ×3 IMPLANT
NEEDLE HYPO 25X1 1.5 SAFETY (NEEDLE) ×3 IMPLANT
NS IRRIG 1000ML POUR BTL (IV SOLUTION) ×3 IMPLANT
PACK MINOR (CUSTOM PROCEDURE TRAY) ×3 IMPLANT
PAD ARMBOARD 7.5X6 YLW CONV (MISCELLANEOUS) ×3 IMPLANT
PAD DRESSING TELFA 3X8 NADH (GAUZE/BANDAGES/DRESSINGS) IMPLANT
PENCIL SMOKE EVACUATOR (MISCELLANEOUS) ×3 IMPLANT
SET BASIN LINEN APH (SET/KITS/TRAYS/PACK) ×3 IMPLANT
SOL PREP PROV IODINE SCRUB 4OZ (MISCELLANEOUS) ×3 IMPLANT
SPONGE GAUZE 4X4 12PLY (GAUZE/BANDAGES/DRESSINGS) ×2 IMPLANT
SUT ETHILON 3 0 PS 1 18 (SUTURE) ×2 IMPLANT
SUT VICRYL 0 UR6 27IN ABS (SUTURE) ×2 IMPLANT
SWAB CULTURE ESWAB REG 1ML (MISCELLANEOUS) ×2 IMPLANT
SYR 20ML LL LF (SYRINGE) ×4 IMPLANT
SYR BULB IRRIG 60ML STRL (SYRINGE) ×3 IMPLANT

## 2022-03-03 NOTE — Anesthesia Procedure Notes (Signed)
Procedure Name: LMA Insertion ?Date/Time: 03/03/2022 9:34 AM ?Performed by: Jonna Munro, CRNA ?Pre-anesthesia Checklist: Patient identified, Emergency Drugs available, Suction available, Patient being monitored and Timeout performed ?Patient Re-evaluated:Patient Re-evaluated prior to induction ?Oxygen Delivery Method: Circle system utilized ?Preoxygenation: Pre-oxygenation with 100% oxygen ?Induction Type: IV induction ?LMA: LMA inserted ?LMA Size: 3.0 ?Number of attempts: 1 ?Placement Confirmation: positive ETCO2 and breath sounds checked- equal and bilateral ?Tube secured with: Tape ?Dental Injury: Teeth and Oropharynx as per pre-operative assessment  ? ? ? ? ?

## 2022-03-03 NOTE — Interval H&P Note (Signed)
History and Physical Interval Note: ? ?03/03/2022 ?9:20 AM ? ?Lori Dodson  has presented today for surgery, with the diagnosis of Abdominal wall abscess.  The various methods of treatment have been discussed with the patient and family. After consideration of risks, benefits and other options for treatment, the patient has consented to  Procedure(s): ?INCISION AND DRAINAGE ABSCESS; ABDOMINAL WALL (N/A) ?HERNIA REPAIR UMBILICAL ADULT (N/A) as a surgical intervention.  The patient's history has been reviewed, patient examined, no change in status, stable for surgery.  I have reviewed the patient's chart and labs.  Questions were answered to the patient's satisfaction.   ? ? ?[name] A Shatoria [place] ? ? ?

## 2022-03-03 NOTE — Anesthesia Preprocedure Evaluation (Signed)
Anesthesia Evaluation  ?Patient identified by MRN, date of birth, ID band ?Patient awake ? ? ? ?Reviewed: ?Allergy & Precautions, NPO status , Patient's Chart, lab work & pertinent test results ? ?Airway ?Mallampati: II ? ?TM Distance: >3 FB ?Neck ROM: Full ? ? ? Dental ? ?(+) Dental Advisory Given,  ?Braces :   ?Pulmonary ?neg pulmonary ROS,  ?  ?Pulmonary exam normal ?breath sounds clear to auscultation ? ? ? ? ? ? Cardiovascular ?negative cardio ROS ?Normal cardiovascular exam ?Rhythm:Regular Rate:Normal ? ? ?  ?Neuro/Psych ?negative neurological ROS ? negative psych ROS  ? GI/Hepatic ?negative GI ROS, Neg liver ROS,   ?Endo/Other  ?negative endocrine ROS ? Renal/GU ?negative Renal ROS  ?negative genitourinary ?  ?Musculoskeletal ?negative musculoskeletal ROS ?(+)  ? Abdominal ?  ?Peds ?negative pediatric ROS ?(+)  Hematology ?negative hematology ROS ?(+)   ?Anesthesia Other Findings ?Right breast cancer ? Reproductive/Obstetrics ?negative OB ROS ? ?  ? ? ? ? ? ? ? ? ? ? ? ? ? ?  ?  ? ? ? ? ? ? ?Anesthesia Physical ?Anesthesia Plan ? ?ASA: 2 ? ?Anesthesia Plan: General  ? ?Post-op Pain Management: Dilaudid IV  ? ?Induction: Intravenous ? ?PONV Risk Score and Plan: 4 or greater and Ondansetron, Dexamethasone and Midazolam ? ?Airway Management Planned: LMA and Oral ETT ? ?Additional Equipment:  ? ?Intra-op Plan:  ? ?Post-operative Plan: Extubation in OR ? ?Informed Consent: I have reviewed the patients History and Physical, chart, labs and discussed the procedure including the risks, benefits and alternatives for the proposed anesthesia with the patient or authorized representative who has indicated his/her understanding and acceptance.  ? ? ? ?Dental advisory given ? ?Plan Discussed with: CRNA and Surgeon ? ?Anesthesia Plan Comments:   ? ? ? ? ? ? ?Anesthesia Quick Evaluation ? ?

## 2022-03-03 NOTE — Discharge Instructions (Signed)
Ambulatory Surgery Discharge Instructions ? ?General Anesthesia or Sedation ?Do not drive or operate heavy machinery for 24 hours.  ?Do not consume alcohol, tranquilizers, sleeping medications, or any non-prescribed medications for 24 hours. ?Do not make important decisions or sign any important papers in the next 24 hours. ?You should have someone with you tonight at home. ? ?Activity ? You are advised to go directly home from the hospital.  Restrict your activities and rest for a day.  Resume light activity tomorrow. No heavy lifting over 10 lbs or strenuous exercise. ? ?Fluids and Diet ?Begin with clear liquids, bouillon, dry toast, soda crackers.  If not nauseated, you may go to a regular diet when you desire.  Greasy and spicy foods are not advised. ? ?Medications ? If you have not had a bowel movement in 24 hours, take 2 tablespoons over the counter Milk of mag. ?            You May resume your blood thinners tomorrow (Aspirin, coumadin, or other).  ?You are being discharged with prescriptions for Opioid/Narcotic Medications: There are some specific considerations for these medications that you should know. ?Opioid Meds have risks & benefits. Addiction to these meds is always a concern with prolonged use ?Take medication only as directed ?Do not drive while taking narcotic pain medication ?Do not crush tablets or capsules ?Do not use a different container than medication was dispensed in ?Lock the container of medication in a cool, dry place out of reach of children and pets. ?Opioid medication can cause addiction ?Do not share with anyone else (this is a felony) ?Do not store medications for future use. Dispose of them properly. ?    Disposal:  ?Find a [employer] household drug take back site near you.  ?If you can't get to a drug take back site, use the recipe below as a last resort to dispose of expired, unused or unwanted drugs. ?Disposal  ?(Do not dispose chemotherapy drugs this way, talk to your  prescribing doctor instead.) Step 1: Mix drugs (do not crush) with dirt, kitty litter, or used coffee grounds and add a small amount of water to dissolve any solid medications. Step 2: Seal drugs in plastic bag. Step 3: Place plastic bag in trash. Step 4: Take prescription container and scratch out personal information, then recycle or throw away. ? ?Operative Site ? You have external stitches.  These will be removed in office in at your follow up appointment. You may have drainage at the incision site, keep area covered.  Apply antibiotic ointment to the incision site. Ok to Games developer. Keep wound clean and dry. No baths or swimming. No lifting more than 10 pounds.   ? ?Contact Information: ?If you have questions or concerns, please call our office, [phone], Monday- Thursday 8AM-5PM and Friday 8AM-12Noon.  ?If it is after hours or on the weekend, please call [facility] Main Number, [phone], and ask to speak to the surgeon on call for Dr. Okey Dupre at [facility].  ? ?SPECIFIC COMPLICATIONS TO WATCH FOR: ?Inability to urinate ?Fever over 101? F by mouth ?Nausea and vomiting lasting longer than 24 hours. ?Pain not relieved by medication ordered ?Swelling around the operative site ?Increased redness, warmth, hardness, around operative area ?Numbness, tingling, or cold fingers or toes ?Blood -soaked dressing, (small amounts of oozing may be normal) ?Increasing and progressive drainage from surgical area or exam site ? ?

## 2022-03-03 NOTE — Op Note (Addendum)
[facility] ?Operative Note ? ?03/03/22 ? ?Preoperative Diagnosis: Abdominal wall wound ?  ?Postoperative Diagnosis: Same ?  ?Procedure(s) Performed: Debridement of abdominal wall wound, open umbilical hernia repair ?  ?Surgeon: Graciella Freer, DO  ?  ?Assistants: Marquita Palms, RN ?  ?Anesthesia: LMA ?  ?Anesthesiologist: Denese Killings, MD  ?  ?Specimens: Wound culture ?  ?Estimated Blood Loss: Minimal ?  ?Blood Replacement: None  ?  ?Complications: None  ? ?Wound Class: Contaminated ?  ?Operative Indications: Patient is a 36 year old female who presented for evaluation of a periumbilical abscess.  Prior to operative intervention, the abscess opened and started draining on its own.  She now has an abdominal wound.  She has a history of periumbilical abscess, and plan is to explore the wound and evaluate for an umbilical hernia. ? ?All risks, benefits, and alternatives to incision and drainage/debridement of abdominal wall wound/abscess, possible open umbilical hernia repair were discussed with the patient, all of her questions were answered to her expressed satisfaction.  The patient expresses she wishes to proceed, and informed consent was obtained. ? ?Findings: Abdominal wall wound (4 x 3 cm) without evidence of active infection, small (less than 1 cm) umbilical hernia palpable ?  ?Procedure: The patient was taken to the operating room and placed supine.  LMA anesthesia was induced. Intravenous antibiotics were administered per protocol. The abdomen was prepared and draped in the usual sterile fashion.  ? ?The periumbilical wound was initially explored.  Wound culture was obtained.  There was no significant purulent drainage noted.  Granulation tissue was removed.  The area of debrided wound was 4 x 3 cm (12 cm squared).  There were no palpable hernia defects initially, however she had redundant fibrous tissue overlying the fascia, likely her umbilical stalk that regressed after her  abdominoplasty.  This tissue was removed, to prevent recurrence of this periumbilical abscess.  After removal of the fibrous tissue, there was a small (less than 1 cm) umbilical defect noted at the base.  This was approximated with an 0 Vicryl in a figure-of-eight fashion.  The edges of the wound were excised to healthy tissue.  Hemostasis was achieved.  The wound was thoroughly irrigated with warm normal saline.  Exparel anesthetic was injected.  Given that there was no active purulence and the size of the wound, decision was made to loosely approximate the incision.  It was closed with 3-0 nylon in a vertical mattress fashion.  The incision was dressed with bacitracin, Telfa, 4 x 4, Medipore tape. ? ?Final inspection revealed acceptable hemostasis. All counts were correct at the end of the case. The patient was awakened from anesthesia without complication.  The patient went to the PACU in stable condition. ?  ?Graciella Freer, DO  ?[facility] ?[place], [place] [address] ?[phone] (office) ? ? ?

## 2022-03-03 NOTE — Transfer of Care (Signed)
Immediate Anesthesia Transfer of Care Note ? ?Patient: Lori Dodson ? ?Procedure(s) Performed: DEBRIDEMENT OF ABDOMINAL WALL WOUND (Abdomen) ?OPEN HERNIA REPAIR UMBILICAL ADULT (Abdomen) ? ?Patient Location: PACU ? ?Anesthesia Type:General ? ?Level of Consciousness: awake, alert , oriented and patient cooperative ? ?Airway & Oxygen Therapy: Patient Spontanous Breathing and Patient connected to nasal cannula oxygen ? ?Post-op Assessment: Report given to RN, Post -op Vital signs reviewed and stable and Patient moving all extremities X 4 ? ?Post vital signs: Reviewed and stable ? ?Last Vitals:  ?Vitals Value Taken Time  ?BP 136/80 03/03/22 1045  ?Temp    ?Pulse 80 03/03/22 1047  ?Resp 14 03/03/22 1048  ?SpO2 100 % 03/03/22 1047  ?Vitals shown include unvalidated device data. ? ?Last Pain:  ?Vitals:  ? 03/03/22 0836  ?TempSrc: Oral  ?PainSc: 0-No pain  ?   ? ?Patients Stated Pain Goal: 5 (03/03/22 0626) ? ?Complications: No notable events documented. ?

## 2022-03-03 NOTE — Progress Notes (Signed)
Update note: ? ?Called the patient's boyfriend, Elsie Saas, to update him after the procedure.  It was explained that the wound was debrided and a small umbilical hernia was noted.  This was repaired with suture.  It was further explained that given the lack of significant infection noted, and size of the wound, decision was made to loosely approximate the incision.  I explained that she has external stitches that will need to be removed in the office.  I advised that the incision should remain covered as it might have some drainage over the next couple of days, and antibiotic ointment can be applied to the incision.  I will discharge her with prescriptions for Roxicodone and Colace.  All questions were answered to his expressed satisfaction. ? ?Graciella Freer, DO ?[facility] ?[place], [place] [address] ?[phone] (office) ? ?

## 2022-03-03 NOTE — Anesthesia Postprocedure Evaluation (Signed)
Anesthesia Post Note ? ?Patient: Lori Dodson ? ?Procedure(s) Performed: DEBRIDEMENT OF ABDOMINAL WALL WOUND (Abdomen) ?OPEN HERNIA REPAIR UMBILICAL ADULT (Abdomen) ? ?Patient location during evaluation: Phase II ?Anesthesia Type: General ?Level of consciousness: awake and alert and oriented ?Pain management: pain level controlled ?Vital Signs Assessment: post-procedure vital signs reviewed and stable ?Respiratory status: spontaneous breathing, nonlabored ventilation and respiratory function stable ?Cardiovascular status: blood pressure returned to baseline and stable ?Postop Assessment: no apparent nausea or vomiting ?Anesthetic complications: no ? ? ?No notable events documented. ? ? ?Last Vitals:  ?Vitals:  ? 03/03/22 1100 03/03/22 1153  ?BP: (!) 146/84 105/76  ?Pulse:  79  ?Resp: 15 18  ?Temp:  37.1 ?C  ?SpO2: 99% 93%  ?  ?Last Pain:  ?Vitals:  ? 03/03/22 1153  ?TempSrc: Oral  ?PainSc: 0-No pain  ? ? ?  ?  ?  ?  ?  ?  ? ?Sumayah Bearse C Kaylla Cobos ? ? ? ? ?

## 2022-03-05 DIAGNOSIS — K429 Umbilical hernia without obstruction or gangrene: Secondary | ICD-10-CM

## 2022-03-05 DIAGNOSIS — S31109A Unspecified open wound of abdominal wall, unspecified quadrant without penetration into peritoneal cavity, initial encounter: Secondary | ICD-10-CM

## 2022-03-06 LAB — AEROBIC/ANAEROBIC CULTURE W GRAM STAIN (SURGICAL/DEEP WOUND)

## 2022-03-10 ENCOUNTER — Encounter (HOSPITAL_COMMUNITY): Payer: Self-pay | Admitting: Surgery

## 2022-03-10 ENCOUNTER — Other Ambulatory Visit: Payer: Self-pay

## 2022-03-10 ENCOUNTER — Ambulatory Visit (INDEPENDENT_AMBULATORY_CARE_PROVIDER_SITE_OTHER): Payer: BC Managed Care – PPO | Admitting: Surgery

## 2022-03-10 VITALS — BP 96/68 | HR 78 | Temp 98.6°F | Resp 12 | Ht 63.0 in | Wt 180.0 lb

## 2022-03-10 DIAGNOSIS — Z09 Encounter for follow-up examination after completed treatment for conditions other than malignant neoplasm: Secondary | ICD-10-CM

## 2022-03-10 NOTE — Progress Notes (Signed)
[facility] Surgical [facility] Note  ? ?HPI:  ?36 y.o. Female presents to clinic for post-op follow-up status post debridement of abdominal wall wound, open umbilical hernia repair on 4/3.  Since the procedure, she has been doing well.  She is tolerating a diet without nausea and vomiting, moving her bowels, and her abdominal pain is controlled.  She denies fevers and chills.  She notes a small amount of brown drainage every time she changes her dressing.  She completed her course of antibiotics. ? ?Review of Systems:  ?All other review of systems: otherwise negative  ? ?Vital Signs:  ?BP 96/68   Pulse 78   Temp 98.6 ?F (37 ?C) (Oral)   Resp 12   Ht '5\' 3"'$  (1.6 m)   Wt 180 lb (81.6 kg)   SpO2 97%   BMI 31.89 kg/m?   ? ?Physical Exam:  ?Physical Exam ?Vitals reviewed.  ?Constitutional:   ?   Appearance: Normal appearance.  ?Abdominal:  ?   Comments: Soft, nondistended, nontender to percussion and palpation, midline incision slightly open with sutures in place, small amount of fibrinous exudate noted, no pus, induration, or fluctuance  ?Neurological:  ?   Mental Status: She is alert.  ? ?Laboratory studies:  ?Culture: ?Specimen Description WOUND  ?Performed at [facility], [address], [place], [place] [address]   ?Special Requests NONE  ?Performed at [facility], [address] [place], [place] [address]   ?Gram Stain MODERATE SQUAMOUS EPITHELIAL CELLS PRESENT  ?FEW WBC PRESENT, PREDOMINANTLY MONONUCLEAR  ?MODERATE GRAM POSITIVE COCCI IN CLUSTERS  ?FEW GRAM POSITIVE COCCI IN PAIRS  ?Performed at [facility], [place] [address] [place], [place] [address]   ?Culture RARE ACINETOBACTER CALCOACETICUS/BAUMANNII COMPLEX  ?MIXED ANAEROBIC FLORA PRESENT.  CALL LAB IF FURTHER IID REQUIRED.   ?Report Status 03/06/2022 FINAL   ?Organism ID, Bacteria ACINETOBACTER CALCOACETICUS/BAUMANNII COMPLEX   ? ? ? ?Imaging: None ? ?Assessment:  ?36 y.o. yo Female who is status post debridement of abdominal wall wound and  repair of open umbilical hernia on 4/3. ? ?Plan:  ?-Patient has been doing well since the surgery with minimal abdominal pain and minimal drainage from her wound ?-While patient's blood cultures were positive for rare Acetobacter and anaerobic flora, given that there is no purulent drainage, we will hold off on antibiotics at this time ?-Advised the patient to call the office if she starts having increasing erythema, pain, or purulent drainage from her incision ?-I discussed with the patient that given the wound is slightly open, we may need to resuture the wound closed, but I want the wound to be able to drain adequately prior to closure ?-Patient to follow-up with me in 2 weeks ? ?All of the above recommendations were discussed with the patient, and all of patient's questions were answered to her expressed satisfaction. ? ?Graciella Freer, DO ?[facility] ?[place],  [address] ?[phone] (office) ? ?

## 2022-03-20 ENCOUNTER — Encounter: Payer: BC Managed Care – PPO | Admitting: Surgery

## 2022-03-25 ENCOUNTER — Encounter: Payer: BC Managed Care – PPO | Admitting: Surgery

## 2022-03-27 ENCOUNTER — Encounter: Payer: Self-pay | Admitting: Surgery

## 2022-03-27 ENCOUNTER — Ambulatory Visit (INDEPENDENT_AMBULATORY_CARE_PROVIDER_SITE_OTHER): Payer: BC Managed Care – PPO | Admitting: Surgery

## 2022-03-27 ENCOUNTER — Other Ambulatory Visit: Payer: Self-pay

## 2022-03-27 VITALS — BP 105/80 | HR 72 | Temp 98.9°F | Resp 14 | Ht 63.0 in | Wt 182.0 lb

## 2022-03-27 DIAGNOSIS — Z09 Encounter for follow-up examination after completed treatment for conditions other than malignant neoplasm: Secondary | ICD-10-CM

## 2022-03-27 NOTE — Progress Notes (Signed)
[facility] Surgical Clinic Note  ? ?HPI:  ?36 y.o. Female presents to clinic for post-op follow-up status post debridement of abdominal wall wound, open umbilical hernia repair on 4/3.  She has been doing well, and states that her incision is healing well.  She denies any fevers or chills.  She denies any further drainage from her incision site.  Her pain is adequately controlled.  She is tolerating a diet without nausea and vomiting, and moving her bowels without issue. ? ?Review of Systems:  ?All other review of systems: otherwise negative  ? ?Vital Signs:  ?BP 105/80   Pulse 72   Temp 98.9 ?F (37.2 ?C) (Oral)   Resp 14   Ht '5\' 3"'$  (1.6 m)   Wt 182 lb (82.6 kg)   SpO2 97%   BMI 32.24 kg/m?   ? ?Physical Exam:  ?Physical Exam ?Vitals reviewed.  ?Constitutional:   ?   Appearance: Normal appearance.  ?Abdominal:  ?   Comments: Abdomen soft, nondistended, nontender to percussion and palpation; midline incision with overlying scab, sutures in place, no erythema, induration, or fluctuance  ?Neurological:  ?   Mental Status: She is alert.  ? ?Laboratory studies: None ? ?Imaging:  ?None ? ?Assessment:  ?36 y.o. yo Female who presents for follow-up status post debridement of abdominal wall wound, open umbilical hernia repair on 4/3 ? ?Plan:  ?-Sutures removed from midline incision ?-Small punctate opening noted, this area was treated with silver nitrate ?-Given that the area is healing, and has an overlying scab, will hold off on any further closure of the incision at this time ?-Advised the patient that she does not need to keep the area covered at this time, given the minimal drainage from the area ?-Follow up in 4 weeks to verify that the area is healing appropriately ? ?All of the above recommendations were discussed with the patient, and all of patient's questions were answered to her expressed satisfaction. ? ?Graciella Freer, DO ?[facility] ?[place],  [place] [address] ?[phone] (office) ?

## 2022-04-17 ENCOUNTER — Encounter: Payer: BC Managed Care – PPO | Admitting: Surgery

## 2022-05-01 ENCOUNTER — Encounter: Payer: BC Managed Care – PPO | Admitting: Surgery

## 2022-05-06 ENCOUNTER — Encounter: Payer: BC Managed Care – PPO | Admitting: Surgery

## 2022-10-09 ENCOUNTER — Other Ambulatory Visit: Payer: Self-pay | Admitting: *Deleted

## 2022-10-09 ENCOUNTER — Other Ambulatory Visit: Payer: Self-pay | Admitting: Hematology and Oncology

## 2022-10-09 DIAGNOSIS — Z170 Estrogen receptor positive status [ER+]: Secondary | ICD-10-CM

## 2022-10-09 NOTE — Progress Notes (Signed)
Received call from pt requesting to schedule breast MRI.  Per MD note pt to receive Breast MRI every 2 years.  Last breast MRI 2021, verbal orders received for pt to undergo breast MRI within the next few weeks. Orders placed, pt educated to contact GI.

## 2022-10-10 ENCOUNTER — Ambulatory Visit
Admission: RE | Admit: 2022-10-10 | Discharge: 2022-10-10 | Disposition: A | Discharge status: Home / Self Care | Payer: BC Managed Care – PPO | Source: Ambulatory Visit | Attending: Hematology and Oncology | Admitting: Hematology and Oncology

## 2022-10-10 ENCOUNTER — Ambulatory Visit: Payer: BC Managed Care – PPO

## 2022-10-10 ENCOUNTER — Other Ambulatory Visit: Payer: Self-pay | Admitting: Hematology and Oncology

## 2022-10-10 DIAGNOSIS — C50411 Malignant neoplasm of upper-outer quadrant of right female breast: Secondary | ICD-10-CM

## 2022-10-15 ENCOUNTER — Other Ambulatory Visit: Payer: Self-pay | Admitting: Hematology and Oncology

## 2022-10-15 DIAGNOSIS — R928 Other abnormal and inconclusive findings on diagnostic imaging of breast: Secondary | ICD-10-CM

## 2022-10-21 ENCOUNTER — Ambulatory Visit: Payer: BC Managed Care – PPO | Admitting: Hematology and Oncology

## 2022-10-28 ENCOUNTER — Ambulatory Visit
Admission: RE | Admit: 2022-10-28 | Discharge: 2022-10-28 | Disposition: A | Discharge status: Home / Self Care | Payer: BC Managed Care – PPO | Source: Ambulatory Visit | Attending: Hematology and Oncology | Admitting: Hematology and Oncology

## 2022-10-28 ENCOUNTER — Other Ambulatory Visit: Payer: Self-pay | Admitting: Hematology and Oncology

## 2022-10-28 DIAGNOSIS — R928 Other abnormal and inconclusive findings on diagnostic imaging of breast: Secondary | ICD-10-CM

## 2022-10-28 DIAGNOSIS — N6320 Unspecified lump in the left breast, unspecified quadrant: Secondary | ICD-10-CM

## 2022-10-28 HISTORY — DX: Personal history of antineoplastic chemotherapy: Z92.21

## 2022-10-29 ENCOUNTER — Ambulatory Visit
Admission: RE | Admit: 2022-10-29 | Discharge: 2022-10-29 | Disposition: A | Discharge status: Home / Self Care | Payer: BC Managed Care – PPO | Source: Ambulatory Visit | Attending: Hematology and Oncology | Admitting: Hematology and Oncology

## 2022-10-29 DIAGNOSIS — C50411 Malignant neoplasm of upper-outer quadrant of right female breast: Secondary | ICD-10-CM

## 2022-10-29 MED ORDER — GADOPICLENOL 0.5 MMOL/ML IV SOLN
9.0000 mL | Freq: Once | INTRAVENOUS | Status: AC | PRN
  Administered 2022-10-29: 9 mL via INTRAVENOUS

## 2022-10-30 ENCOUNTER — Ambulatory Visit
Admission: RE | Admit: 2022-10-30 | Discharge: 2022-10-30 | Disposition: A | Discharge status: Home / Self Care | Payer: BC Managed Care – PPO | Source: Ambulatory Visit | Attending: Hematology and Oncology | Admitting: Hematology and Oncology

## 2022-10-30 DIAGNOSIS — N6320 Unspecified lump in the left breast, unspecified quadrant: Secondary | ICD-10-CM

## 2022-10-30 HISTORY — PX: BREAST BIOPSY: SHX20

## 2022-11-06 NOTE — Progress Notes (Incomplete)
Patient Care Team: Patient, [name] as PCP - General (General Practice)  DIAGNOSIS: No diagnosis found.  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%, HER-2 positive ratio 5.61; right biopsy 8:00: IDC grade 3, ER 50%, PR 5%, Ki-67 80%, HER-2 positive ratio 5.38   09/11/2015 Breast MRI   Right breast middle depth: 2.7 cm; right breast posterior outer: 4.4 cm; right breast retroareolar 2.7 cm; probable right axillary and right axillary tail lymphadenopathy   09/18/2015 - 01/01/2016 Neo-Adjuvant Chemotherapy   TCH Perjeta 6   01/09/2016 Breast MRI   Significantly improved enhancement in Rt breast, small enh in central portion, No susp LN   01/22/2016 - 09/09/2016 Chemotherapy   Herceptin maintenance to total one year of therapy   01/24/2016 Genetic Testing   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  Genes analyzed:  APC, ATM, AXIN2, BARD1, BMPR1A, BRCA1, BRCA2, BRIP1, CDH1, CDKN2A, CHEK2, DICER1, EPCAM, GREM1, KIT, MEN1, MLH1, MSH2, MSH6, MUTYH, NBN, NF1, PALB2, PDGFRA, PMS2, POLD1, POLE, PTEN, RAD50, RAD51C, RAD51D, SDHA, SDHB, SDHC, SDHD, SMAD4, SMARCA4, STK11, TP53, TSC1, TSC2, and VHL.   02/11/2016 Surgery   Rt Mastectomy Marlou Starks): No residual cancer 0/16 LN Negative for cancer.   02/18/2016 -  Anti-estrogen oral therapy   Tamoxifen 20 mg daily, planned duration of therapy: 10 years.      CHIEF COMPLIANT:   INTERVAL HISTORY: Lori Dodson is a   ALLERGIES:  is allergic to chlorhexidine gluconate, latex, and gadavist [gadobutrol].  MEDICATIONS:  Current Outpatient  Medications  Medication Sig Dispense Refill   docusate sodium (COLACE) 100 MG capsule Take 1 capsule (100 mg total) by mouth 2 (two) times daily. 60 capsule 2   Probiotic Product (PROBIOTIC PO) Take 1 capsule by mouth daily.     Sennosides-Docusate Sodium (SENNA PLUS) 8.6-50 MG CAPS Take 1 tablet by mouth 2 (two) times daily.     tamoxifen (NOLVADEX) 20 MG tablet Take 1 tablet (20 mg total) by mouth daily. 90 tablet 3   No current facility-administered medications for this visit.    PHYSICAL EXAMINATION: ECOG PERFORMANCE STATUS: {CHL ONC ECOG PS:[id number]}  There were no vitals filed for this visit. There were no vitals filed for this visit.  BREAST:*** No palpable masses or nodules in either right or left breasts. No palpable axillary supraclavicular or infraclavicular adenopathy no breast tenderness or nipple discharge. (exam performed in the presence of a chaperone)  LABORATORY DATA:  I have reviewed the data as listed    Latest Ref Rng & Units 02/22/2022    9:49 PM 09/09/2016    9:10 AM 08/20/2016    2:43 PM  CMP  Glucose 70 - 99 mg/dL 89  109  126   BUN 6 - 20 mg/dL 17  11.7  15.9   Creatinine 0.44 - 1.00 mg/dL 0.90  0.9  1.1   Sodium 135 - 145 mmol/L 136  144  143   Potassium 3.5 - 5.1 mmol/L 5.2  3.9  4.0   Chloride 98 - 111 mmol/L 108     CO2 22 - 32 mmol/L _0 Calcium 8.9 -  10.3 mg/dL 8.7  8.6  9.0   Total Protein 6.5 - 8.1 g/dL 6.4  6.2  6.7   Total Bilirubin 0.3 - 1.2 mg/dL 1.0  0.68  0.75   Alkaline Phos 38 - 126 U/L 37  47  50   AST 15 - 41 U/L 33  13  13   ALT 0 - 44 U/L _0 Lab Results  Component Value Date   WBC 14.9 (H) 02/22/2022   HGB 13.7 02/22/2022   HCT 40.9 02/22/2022   MCV 85.7 02/22/2022   PLT 297 02/22/2022   NEUTROABS 8.0 (H) 02/22/2022    ASSESSMENT & PLAN:  No problem-specific Assessment & Plan notes found for this encounter.    No orders of the defined types were placed in this encounter.  The patient has a  good understanding of the overall plan. she agrees with it. she will call with any problems that may develop before the next visit here. Total time spent: 30 mins including face to face time and time spent for planning, charting and co-ordination of care   Suzzette Righter, [place] 11/06/22 I, Gardiner Coins, am acting as a scribe for Dr. Nicholas Lose  ***

## 2022-11-10 ENCOUNTER — Ambulatory Visit: Payer: BC Managed Care – PPO | Admitting: Hematology and Oncology

## 2023-01-04 ENCOUNTER — Other Ambulatory Visit: Payer: Self-pay | Admitting: Hematology and Oncology

## 2023-01-04 DIAGNOSIS — Z170 Estrogen receptor positive status [ER+]: Secondary | ICD-10-CM

## 2023-01-12 ENCOUNTER — Telehealth: Payer: Self-pay

## 2023-01-12 NOTE — Telephone Encounter (Signed)
Pt called to ask what SSRI Dr Lindi Adie recommends with Tamoxifen as sertraline is contraindicated per her PCP. Advised pt MD prefers SNRI Effexor to avoid contraindication with efficacy. She verbalized thanks and understanding and will contact her PCP regarding this.

## 2023-01-22 NOTE — Progress Notes (Signed)
Patient Care Team: Patient, [name] as PCP - General (General Practice)  DIAGNOSIS: No diagnosis found.  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%, HER-2 positive ratio 5.61; right biopsy 8:00: IDC grade 3, ER 50%, PR 5%, Ki-67 80%, HER-2 positive ratio 5.38   09/11/2015 Breast MRI   Right breast middle depth: 2.7 cm; right breast posterior outer: 4.4 cm; right breast retroareolar 2.7 cm; probable right axillary and right axillary tail lymphadenopathy   09/18/2015 - 01/01/2016 Neo-Adjuvant Chemotherapy   TCH Perjeta 6   01/09/2016 Breast MRI   Significantly improved enhancement in Rt breast, small enh in central portion, No susp LN   01/22/2016 - 09/09/2016 Chemotherapy   Herceptin maintenance to total one year of therapy   01/24/2016 Genetic Testing   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  Genes analyzed:  APC, ATM, AXIN2, BARD1, BMPR1A, BRCA1, BRCA2, BRIP1, CDH1, CDKN2A, CHEK2, DICER1, EPCAM, GREM1, KIT, MEN1, MLH1, MSH2, MSH6, MUTYH, NBN, NF1, PALB2, PDGFRA, PMS2, POLD1, POLE, PTEN, RAD50, RAD51C, RAD51D, SDHA, SDHB, SDHC, SDHD, SMAD4, SMARCA4, STK11, TP53, TSC1, TSC2, and VHL.   02/11/2016 Surgery   Rt Mastectomy Marlou Starks): No residual cancer 0/16 LN Negative for cancer.   02/18/2016 -  Anti-estrogen oral therapy   Tamoxifen 20 mg daily, planned duration of therapy: 10 years.      CHIEF COMPLIANT: Follow-up of right breast cancer on tamoxifen therapy    INTERVAL HISTORY: Lori Dodson is a 37 y.o. with above-mentioned history of right breast cancer treated with  neoadjuvant chemotherapy, mastectomy, and is currently on anti-estrogen therapy with tamoxifen. She presents to the clinic today for annual follow-up.   ALLERGIES:  is allergic to chlorhexidine gluconate, latex, and gadavist [gadobutrol].  MEDICATIONS:  Current Outpatient Medications  Medication Sig Dispense Refill   docusate sodium (COLACE) 100 MG capsule Take 1 capsule (100 mg total) by mouth 2 (two) times daily. 60 capsule 2   Probiotic Product (PROBIOTIC PO) Take 1 capsule by mouth daily.     Sennosides-Docusate Sodium (SENNA PLUS) 8.6-50 MG CAPS Take 1 tablet by mouth 2 (two) times daily.     tamoxifen (NOLVADEX) 20 MG tablet TAKE 1 TABLET(20 MG) BY MOUTH DAILY 90 tablet 0   No current facility-administered medications for this visit.    PHYSICAL EXAMINATION: ECOG PERFORMANCE STATUS: {CHL ONC ECOG PS:[id number]}  There were no vitals filed for this visit. There were no vitals filed for this visit.  BREAST:*** No palpable masses or nodules in either right or left breasts. No palpable axillary supraclavicular or infraclavicular adenopathy no breast tenderness or nipple discharge. (exam performed in the presence of a chaperone)  LABORATORY DATA:  I have reviewed the data as listed    Latest Ref Rng & Units 02/22/2022    9:49 PM 09/09/2016    9:10 AM 08/20/2016    2:43 PM  CMP  Glucose 70 - 99 mg/dL 89  109  126   BUN 6 - 20 mg/dL 17  11.7  15.9   Creatinine 0.44 - 1.00 mg/dL 0.90  0.9  1.1   Sodium 135 - 145 mmol/L 136  144  143  Potassium 3.5 - 5.1 mmol/L 5.2  3.9  4.0   Chloride 98 - 111 mmol/L 108     CO2 22 - 32 mmol/L '21  25  24   '$ Calcium 8.9 - 10.3 mg/dL 8.7  8.6  9.0   Total Protein 6.5 - 8.1 g/dL 6.4  6.2  6.7   Total Bilirubin 0.3 - 1.2 mg/dL 1.0  0.68  0.75   Alkaline Phos 38 - 126 U/L 37  47  50   AST 15 - 41 U/L 33  13  13   ALT 0 - 44 U/L '12  10  13     '$ Lab Results  Component Value Date   WBC 14.9 (H) 02/22/2022   HGB 13.7 02/22/2022   HCT 40.9  02/22/2022   MCV 85.7 02/22/2022   PLT 297 02/22/2022   NEUTROABS 8.0 (H) 02/22/2022    ASSESSMENT & PLAN:  No problem-specific Assessment & Plan notes found for this encounter.    No orders of the defined types were placed in this encounter.  The patient has a good understanding of the overall plan. she agrees with it. she will call with any problems that may develop before the next visit here. Total time spent: 30 mins including face to face time and time spent for planning, charting and co-ordination of care   Suzzette Righter, [place] 01/22/23    I Gardiner Coins am acting as a Education administrator for [employer]  ***

## 2023-01-26 ENCOUNTER — Inpatient Hospital Stay: Payer: BC Managed Care – PPO | Attending: Hematology and Oncology | Admitting: Hematology and Oncology

## 2023-01-26 VITALS — BP 115/70 | HR 70 | Temp 97.7°F | Resp 16 | Wt 180.7 lb

## 2023-01-26 DIAGNOSIS — Z79810 Long term (current) use of selective estrogen receptor modulators (SERMs): Secondary | ICD-10-CM | POA: Diagnosis not present

## 2023-01-26 DIAGNOSIS — C50411 Malignant neoplasm of upper-outer quadrant of right female breast: Secondary | ICD-10-CM

## 2023-01-26 DIAGNOSIS — Z9221 Personal history of antineoplastic chemotherapy: Secondary | ICD-10-CM | POA: Diagnosis not present

## 2023-01-26 DIAGNOSIS — Z9011 Acquired absence of right breast and nipple: Secondary | ICD-10-CM | POA: Diagnosis not present

## 2023-01-26 DIAGNOSIS — Z170 Estrogen receptor positive status [ER+]: Secondary | ICD-10-CM | POA: Insufficient documentation

## 2023-01-26 MED ORDER — TAMOXIFEN CITRATE 20 MG PO TABS: 20.0000 mg | ORAL_TABLET | Freq: Every day | ORAL | 3 refills | Status: DC

## 2023-01-26 NOTE — Assessment & Plan Note (Addendum)
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 Adjuvant Anti estrogen therapy with Tamoxifen 20 mg daily X 10 years started 02/18/2016 2. Herceptin maintenance every 3 weeks Completed October 2017. --------------------------------------------------------------------------------------------------------- Tamoxifen toxicities: 1. Hot flashes: stopped  Currently on Effexor and treatment is making her sleepy.  Instructed her to take it every other day.   Surveillance: 1. Mammogram 10/04/2021: No evidence of malignancy.    Breast density category C  2. breast exam 01/26/2023: No palpable lumps or nodules of concern. 3. Breast MRI: 10/29/2022 enhancing mass left breast 1.2 cm biopsy: Benign on 10/30/2022   MRIs will be done in June 2024 RTC in 1 year for follow up

## 2023-04-09 ENCOUNTER — Other Ambulatory Visit: Payer: Self-pay | Admitting: Hematology and Oncology

## 2023-04-09 DIAGNOSIS — Z170 Estrogen receptor positive status [ER+]: Secondary | ICD-10-CM

## 2023-05-19 ENCOUNTER — Ambulatory Visit
Admission: RE | Admit: 2023-05-19 | Discharge: 2023-05-19 | Disposition: A | Discharge status: Home / Self Care | Payer: BC Managed Care – PPO | Source: Ambulatory Visit | Attending: Hematology and Oncology | Admitting: Hematology and Oncology

## 2023-05-19 DIAGNOSIS — Z170 Estrogen receptor positive status [ER+]: Secondary | ICD-10-CM

## 2023-05-19 MED ORDER — GADOPICLENOL 0.5 MMOL/ML IV SOLN
9.0000 mL | Freq: Once | INTRAVENOUS | Status: AC | PRN
  Administered 2023-05-19: 9 mL via INTRAVENOUS

## 2023-05-20 ENCOUNTER — Other Ambulatory Visit: Payer: Self-pay | Admitting: Hematology and Oncology

## 2023-05-20 DIAGNOSIS — R928 Other abnormal and inconclusive findings on diagnostic imaging of breast: Secondary | ICD-10-CM

## 2023-05-27 ENCOUNTER — Ambulatory Visit
Admission: RE | Admit: 2023-05-27 | Discharge: 2023-05-27 | Disposition: A | Discharge status: Home / Self Care | Payer: BC Managed Care – PPO | Source: Ambulatory Visit | Attending: Hematology and Oncology | Admitting: Hematology and Oncology

## 2023-05-27 DIAGNOSIS — R928 Other abnormal and inconclusive findings on diagnostic imaging of breast: Secondary | ICD-10-CM

## 2023-05-27 MED ORDER — GADOPICLENOL 0.5 MMOL/ML IV SOLN: 10.0000 mL | Freq: Once | INTRAVENOUS | Status: DC | PRN

## 2024-01-25 ENCOUNTER — Telehealth: Payer: Self-pay | Admitting: Hematology and Oncology

## 2024-01-25 NOTE — Telephone Encounter (Signed)
 Rescheduled appointment per patients request via incoming call. Patient is aware of the changes made to her upcoming appointment.

## 2024-01-28 ENCOUNTER — Ambulatory Visit: Payer: BC Managed Care – PPO | Admitting: Hematology and Oncology

## 2024-02-17 ENCOUNTER — Inpatient Hospital Stay: Payer: BC Managed Care – PPO | Attending: Hematology and Oncology | Admitting: Hematology and Oncology

## 2024-02-17 VITALS — BP 118/78 | HR 80 | Temp 98.1°F | Resp 17 | Ht 63.0 in | Wt 192.0 lb

## 2024-02-17 DIAGNOSIS — Z79810 Long term (current) use of selective estrogen receptor modulators (SERMs): Secondary | ICD-10-CM | POA: Diagnosis not present

## 2024-02-17 DIAGNOSIS — Z170 Estrogen receptor positive status [ER+]: Secondary | ICD-10-CM

## 2024-02-17 DIAGNOSIS — C50411 Malignant neoplasm of upper-outer quadrant of right female breast: Secondary | ICD-10-CM

## 2024-02-17 DIAGNOSIS — Z1731 Human epidermal growth factor receptor 2 positive status: Secondary | ICD-10-CM | POA: Diagnosis not present

## 2024-02-17 NOTE — Assessment & Plan Note (Signed)
 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 Adjuvant Anti estrogen therapy with Tamoxifen 20 mg daily X 10 years started 02/18/2016 2. Herceptin maintenance every 3 weeks Completed October 2017. --------------------------------------------------------------------------------------------------------- Tamoxifen toxicities: 1. Hot flashes: stopped   Currently on Effexor and treatment is making her sleepy.  Instructed her to take it every other day.   Surveillance: 1. Mammogram 10/04/2021: No evidence of malignancy.    Breast density category C  2. breast exam 02/17/2024: No palpable lumps or nodules of concern. 3. Breast MRI: 05/19/2023 increased enhancement of the left breast, 3 areas of non-mass enhancement 05/27/2023: Biopsies: Benign, PASH   MRIs will be done in June 2025 RTC in 1 year for follow up

## 2024-02-17 NOTE — Progress Notes (Signed)
 Patient Care Team: Patient, [name] as PCP - General (General Practice)  DIAGNOSIS:  Encounter Diagnosis  Name Primary?   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%, HER-2 positive ratio 5.61; right biopsy 8:00: IDC grade 3, ER 50%, PR 5%, Ki-67 80%, HER-2 positive ratio 5.38   09/11/2015 Breast MRI   Right breast middle depth: 2.7 cm; right breast posterior outer: 4.4 cm; right breast retroareolar 2.7 cm; probable right axillary and right axillary tail lymphadenopathy   09/18/2015 - 01/01/2016 Neo-Adjuvant Chemotherapy   TCH Perjeta 6   01/09/2016 Breast MRI   Significantly improved enhancement in Rt breast, small enh in central portion, No susp LN   01/22/2016 - 09/09/2016 Chemotherapy   Herceptin maintenance to total one year of therapy   01/24/2016 Genetic Testing   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  Genes analyzed:  APC, ATM, AXIN2, BARD1, BMPR1A, BRCA1, BRCA2, BRIP1, CDH1, CDKN2A, CHEK2, DICER1, EPCAM, GREM1, KIT, MEN1, MLH1, MSH2, MSH6, MUTYH, NBN, NF1, PALB2, PDGFRA, PMS2, POLD1, POLE, PTEN, RAD50, RAD51C, RAD51D, SDHA, SDHB, SDHC, SDHD, SMAD4, SMARCA4, STK11, TP53, TSC1, TSC2, and VHL.   02/11/2016 Surgery   Rt Mastectomy Carolynne Edouard): No residual cancer 0/16 LN Negative for cancer.   02/18/2016 -  Anti-estrogen oral therapy   Tamoxifen 20 mg daily, planned duration of therapy: 10 years.      CHIEF COMPLIANT:   HISTORY OF PRESENT  ILLNESS: Discussed the use of AI scribe software for clinical note transcription with the patient, who gave verbal consent to proceed.  History of Present Illness Lori Dodson, a patient with a history of breast cancer currently on tamoxifen therapy, presents with concerns about recent weight gain. She reports an increase of approximately 30 pounds, despite regular gym workouts and a healthy diet. She denies any changes in her lifestyle or eating habits that could account for the weight gain. She has been on tamoxifen for approximately eight years and is unsure if this could be contributing to her weight gain.  In addition to her weight concerns, Lori Dodson is also contemplating having another child. She is currently not on any form of birth control and has been having regular menstrual cycles. She has not been actively trying to get pregnant but has not been using protection either. She is considering stopping her tamoxifen therapy due to its potential for causing birth defects.     ALLERGIES:  is allergic to chlorhexidine, chlorhexidine gluconate, latex, and gadavist [gadobutrol].  MEDICATIONS:  Current Outpatient Medications  Medication Sig Dispense Refill   Probiotic Product (PROBIOTIC PO) Take 1 capsule by mouth daily.     Sennosides-Docusate Sodium (SENNA PLUS) 8.6-50 MG CAPS Take 1 tablet by mouth 2 (two) times daily.     tamoxifen (NOLVADEX) 20 MG tablet TAKE 1 TABLET(20 MG) BY MOUTH DAILY 90 tablet 3   venlafaxine XR (EFFEXOR-XR) 37.5 MG 24 hr capsule Take 37.5 mg by mouth daily.     No current facility-administered medications for this visit.    PHYSICAL EXAMINATION: ECOG PERFORMANCE STATUS: 1 - Symptomatic  but completely ambulatory  Vitals:   02/17/24 1053  BP: 118/78  Pulse: 80  Resp: 17  Temp: 98.1 F (36.7 C)  SpO2: 100%   Filed Weights   02/17/24 1053  Weight: 192 lb (87.1 kg)    Physical Exam BREAST: Breasts with surgical changes, no abnormalities detected.  (exam  performed in the presence of a chaperone)  LABORATORY DATA:  I have reviewed the data as listed    Latest Ref Rng & Units 02/22/2022    9:49 PM 09/09/2016    9:10 AM 08/20/2016    2:43 PM  CMP  Glucose 70 - 99 mg/dL 89  161  096   BUN 6 - 20 mg/dL 17  04.5  40.9   Creatinine 0.44 - 1.00 mg/dL 8.11  0.9  1.1   Sodium 135 - 145 mmol/L 136  144  143   Potassium 3.5 - 5.1 mmol/L 5.2  3.9  4.0   Chloride 98 - 111 mmol/L 108     CO2 22 - 32 mmol/L 21  25  24    Calcium 8.9 - 10.3 mg/dL 8.7  8.6  9.0   Total Protein 6.5 - 8.1 g/dL 6.4  6.2  6.7   Total Bilirubin 0.3 - 1.2 mg/dL 1.0  9.14  7.82   Alkaline Phos 38 - 126 U/L 37  47  50   AST 15 - 41 U/L 33  13  13   ALT 0 - 44 U/L 12  10  13      Lab Results  Component Value Date   WBC 14.9 (H) 02/22/2022   HGB 13.7 02/22/2022   HCT 40.9 02/22/2022   MCV 85.7 02/22/2022   PLT 297 02/22/2022   NEUTROABS 8.0 (H) 02/22/2022    ASSESSMENT & PLAN:  Malignant neoplasm of upper-outer quadrant of right breast in female, estrogen receptor positive (HCC) 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 Adjuvant Anti estrogen therapy with Tamoxifen 20 mg daily X 10 years started 02/18/2016 2. Herceptin maintenance every 3 weeks Completed October 2017. --------------------------------------------------------------------------------------------------------- Tamoxifen toxicities: 1. Hot  flashes: stopped   Currently on Effexor and treatment is making her sleepy.  Instructed her to take it every other day.   Surveillance: 1. Mammogram 10/04/2021: No evidence of malignancy.    Breast density category C  2. breast exam 02/17/2024: No palpable lumps or nodules of concern. 3. Breast MRI: 05/19/2023 increased enhancement of the left breast, 3 areas of non-mass enhancement 05/27/2023: Biopsies: Benign, PASH   MRIs will be done in June 2025 Patient is contemplating on having another baby.  I discussed with her that she needs to stop tamoxifen if she plans to have a child.  She will think about it and decide.  We discussed the risk of birth defects if she were to be on tamoxifen and get pregnant.  Weight gain: I discussed with her about different options for weight loss including her joining weight watchers program as well as consideration for  GLP-1 inhibitors like semaglutide.  I provided her with some information on resources to reach out.  If she were to get pregnant she should not be on any weight loss injections.  RTC in 1 year for follow up   Assessment & Plan Breast cancer surveillance Undergoing regular surveillance with MRIs and mammograms. Dense breast tissue complicates imaging. MRI contraindicated if pregnant. - Order MRI for June unless pregnant. - Schedule mammogram for November.  Desire for pregnancy Contemplating pregnancy while on tamoxifen, which is contraindicated due to teratogenic risks. Advised stopping tamoxifen if actively trying to conceive. Majority of tamoxifen benefit achieved in first 5 years. Resume post-pregnancy to complete remaining two years. - Advise stopping tamoxifen if actively trying to conceive. - Recommend consulting a fertility specialist if not pregnant within 3-6 months after stopping tamoxifen. - Advise consulting OB for referral to a fertility specialist.  Weight gain Weight increased from 165-170 pounds to 192 pounds despite healthy  lifestyle. Suspected slowing metabolism, possibly exacerbated by long-term tamoxifen use. Discussed weight loss options including lifestyle changes and pharmacological interventions. Weight loss injections offer rapid effects but high regain rate post-cessation. Insurance coverage limited. Alternative is compounded semaglutide through [employer] program. - Refer to [facility] weight loss program. - Advise checking with insurance regarding coverage for weight loss injections. - Provide information on weight loss injections such as Ozempic, Wegovy, Mounjaro, and Zeppound. - Discuss alternative option of compounded semaglutide through [employer] program.      Orders Placed This Encounter  Procedures   MR BREAST BILATERAL W WO CONTRAST INC CAD    Standing Status:   Future    Expected Date:   05/19/2024    Expiration Date:   02/16/2025    If indicated for the ordered procedure, I authorize the administration of contrast media per Radiology protocol:   Yes    What is the patient's sedation requirement?:   No Sedation    Does the patient have a pacemaker or implanted devices?:   No    Preferred imaging location?:   [facility] (table limit-550lbs)    Release to patient:   Immediate   The patient has a good understanding of the overall plan. she agrees with it. she will call with any problems that may develop before the next visit here. Total time spent: 30 mins including face to face time and time spent for planning, charting and co-ordination of care   Tamsen Meek, MD 02/17/24

## 2024-06-14 ENCOUNTER — Other Ambulatory Visit: Payer: Self-pay | Admitting: Hematology and Oncology

## 2024-06-14 DIAGNOSIS — Z1231 Encounter for screening mammogram for malignant neoplasm of breast: Secondary | ICD-10-CM

## 2024-07-12 ENCOUNTER — Ambulatory Visit

## 2025-02-20 ENCOUNTER — Inpatient Hospital Stay: Admitting: Hematology and Oncology
